# Patient Record
Sex: Female | Born: 1985 | Race: White | Hispanic: Yes | Marital: Married | Smoking: Never smoker
Health system: Southern US, Community
[De-identification: ages and names within clinical notes are randomized; demographics above are authoritative.]

## PROBLEM LIST (undated history)

## (undated) ENCOUNTER — Inpatient Hospital Stay (HOSPITAL_COMMUNITY): Payer: Self-pay

## (undated) DIAGNOSIS — D649 Anemia, unspecified: Secondary | ICD-10-CM

## (undated) DIAGNOSIS — E785 Hyperlipidemia, unspecified: Secondary | ICD-10-CM

## (undated) DIAGNOSIS — Z789 Other specified health status: Secondary | ICD-10-CM

## (undated) DIAGNOSIS — I82409 Acute embolism and thrombosis of unspecified deep veins of unspecified lower extremity: Secondary | ICD-10-CM

## (undated) HISTORY — PX: TUBAL LIGATION: SHX77

## (undated) HISTORY — DX: Hyperlipidemia, unspecified: E78.5

## (undated) HISTORY — DX: Anemia, unspecified: D64.9

## (undated) HISTORY — PX: BREAST SURGERY: SHX581

## (undated) HISTORY — DX: Acute embolism and thrombosis of unspecified deep veins of unspecified lower extremity: I82.409

---

## 2005-08-03 ENCOUNTER — Ambulatory Visit (HOSPITAL_COMMUNITY): Admission: RE | Admit: 2005-08-03 | Discharge: 2005-08-03 | Payer: Self-pay | Admitting: Obstetrics and Gynecology

## 2005-08-24 ENCOUNTER — Inpatient Hospital Stay (HOSPITAL_COMMUNITY): Admission: RE | Admit: 2005-08-24 | Discharge: 2005-08-26 | Payer: Self-pay | Admitting: Obstetrics and Gynecology

## 2009-04-23 ENCOUNTER — Emergency Department (HOSPITAL_COMMUNITY): Admission: EM | Admit: 2009-04-23 | Discharge: 2009-04-24 | Payer: Self-pay | Admitting: Emergency Medicine

## 2009-04-25 ENCOUNTER — Emergency Department (HOSPITAL_COMMUNITY): Admission: EM | Admit: 2009-04-25 | Discharge: 2009-04-25 | Payer: Self-pay | Admitting: Emergency Medicine

## 2009-04-25 ENCOUNTER — Encounter: Payer: Self-pay | Admitting: Obstetrics & Gynecology

## 2009-12-06 ENCOUNTER — Inpatient Hospital Stay (HOSPITAL_COMMUNITY): Admission: AD | Admit: 2009-12-06 | Discharge: 2009-12-07 | Payer: Self-pay | Admitting: Family Medicine

## 2009-12-06 ENCOUNTER — Ambulatory Visit: Payer: Self-pay | Admitting: Nurse Practitioner

## 2010-01-06 ENCOUNTER — Other Ambulatory Visit: Admission: RE | Admit: 2010-01-06 | Discharge: 2010-01-06 | Payer: Self-pay | Admitting: Obstetrics and Gynecology

## 2010-07-17 LAB — CBC
HCT: 37.1 % (ref 36.0–46.0)
MCHC: 34.2 g/dL (ref 30.0–36.0)
MCV: 80.7 fL (ref 78.0–100.0)
Platelets: 177 10*3/uL (ref 150–400)
RDW: 14.9 % (ref 11.5–15.5)

## 2010-07-17 LAB — TYPE AND SCREEN: ABO/RH(D): B POS

## 2010-07-17 LAB — SURGICAL PCR SCREEN: Staphylococcus aureus: NEGATIVE

## 2010-07-17 LAB — RPR: RPR Ser Ql: NONREACTIVE

## 2010-07-18 ENCOUNTER — Inpatient Hospital Stay (HOSPITAL_COMMUNITY)
Admission: RE | Admit: 2010-07-18 | Discharge: 2010-07-20 | Payer: Self-pay | Source: Home / Self Care | Attending: Obstetrics & Gynecology | Admitting: Obstetrics & Gynecology

## 2010-07-18 LAB — URINALYSIS, ROUTINE W REFLEX MICROSCOPIC
Nitrite: NEGATIVE
Urine Glucose, Fasting: NEGATIVE mg/dL
Urobilinogen, UA: 0.2 mg/dL (ref 0.0–1.0)

## 2010-07-19 LAB — CBC
HCT: 30.5 % — ABNORMAL LOW (ref 36.0–46.0)
Hemoglobin: 10.1 g/dL — ABNORMAL LOW (ref 12.0–15.0)
MCV: 83.6 fL (ref 78.0–100.0)
RBC: 3.65 MIL/uL — ABNORMAL LOW (ref 3.87–5.11)
WBC: 8.8 10*3/uL (ref 4.0–10.5)

## 2010-08-05 NOTE — Discharge Summary (Signed)
  NAME:  Kristine, Adams    ACCOUNT NO.:  [id number]  MEDICAL RECORD NO.:  [id number]          PATIENT TYPE:  INP  LOCATION:  9136                          [facility]ACILITY:  [facility]  PHYSICIAN:  Lazaro Arms, M.D.   DATE OF BIRTH:  1986-03-07  DATE OF ADMISSION:  07/18/2010 DATE OF DISCHARGE:  07/20/2010                              DISCHARGE SUMMARY   REASON FOR ADMISSION:  Pregnancy at 40-3/7 weeks with previous cesarean section x2 with declined VBAC, for repeat low transverse cesarean section.  PROCEDURE:  Low-transverse cesarean section by Dr. Duane Lope which delivered a female infant weight 7 pounds 7 ounces with a cord pH 7.07. Apgars of 8 and 9.  Hospital course has been uneventful.  The patient is up ambulating well taking p.o. fluids and solids well, passing gas rectally.  Discharge medications are as follows prenatal vitamin one p.o. daily, Motrin 600 p.o. q.6 h. p.r.n. cramping, Percocet 5/325 one p.o. q.4 h. p.r.n. pain.  FOLLOWUP:  She is to follow up at the [facility] in 6 weeks for her postpartum checkup or p.r.n. if any problems. Discharge hemoglobin is 12.7, discharge hematocrit is 37.1.  ACTIVITY LEVEL:  No heavy lifting or driving for at least 2 weeks postoperatively.  PHYSICAL EXAMINATION:  VITAL SIGNS:  Today, vital signs are stable. HEART:  Regular rhythm and rate. LUNGS:  Clear to auscultation bilaterally. ABDOMEN:  Soft.  Bowel sounds are present in all 4 quadrants. PELVIS:  Fundus is firm.  Lochia small amount.  There is no edema in the lower extremities.  Incision is intact.  There is no redness, swelling, or drainage.  ASSESSMENT:  Stable postop day #2.  PLAN:  We are going to discharge her home to follow up at [facility] in 6 weeks or p.r.n.     Zerita Boers, N.M.   ______________________________ Lazaro Arms, M.D.    DL/MEDQ  D:  16/03/9603  T:  07/20/2010  Job:  [id number]  cc:   [facility] Department  Electronically Signed by Wyvonnia Dusky N.M. on 08/03/2010 01:08:56 PM Electronically Signed by Duane Lope M.D. on 08/05/2010 04:59:02 PM

## 2010-08-05 NOTE — H&P (Signed)
  NAME:  Kristine Adams, Kristine Adams NO.:  [id number]  MEDICAL RECORD NO.:  [id number]          PATIENT TYPE:  INP  LOCATION:  9136                          [facility]  PHYSICIAN:  Lazaro Arms, M.D.   DATE OF BIRTH:  05-27-1986  DATE OF ADMISSION:  07/18/2010 DATE OF DISCHARGE:                             HISTORY & PHYSICAL   HISTORY OF PRESENT ILLNESS:  [name] is a 25 year old Hispanic female, gravida 4, para 2, abortus 1, estimated date of delivery of July 16, 2010, currently 40-3/7th weeks' gestation.  She has previous C-section x2 and despite my protest, wanted to undergo a vaginal trial. She had her first 2 C-sections in [place], however, the patient decided she wanted to go forward with vaginal delivery, however, when she came for a visit this week at 40 weeks, was still long, thick, and closed. She changed her mind and decided to proceed with a repeat cesarean section.  She was admitted today for that.  Pregnancy otherwise has been completely uncomplicated.  PAST MEDICAL HISTORY:  Negative.  PAST SURGERIES:  Two C-sections.  PAST OB:  Two C-sections, the first one for nonreassuring fetal heart rate strip and the second was a repeat, both in [place].  ALLERGIES:  None.  MEDICATIONS:  Prenatal vitamins and iron.  REVIEW OF SYSTEMS:  Negative.  PRENATAL LABS:  Her blood type is B positive, rubella is immune, hepatitis B was negative, HIV is negative x2,  HSV 2 is negative, serology is negative x2, GC and Chlamydia are negative x2, AFP was normal, group B strep was negative, Glucola was 140, but I never could get the patient to come back in for a 3-hour.  Her hemoglobin and hematocrit were 12 and 36.5 at 28 weeks  Her group B strep was negative.  PHYSICAL EXAMINATION:  HEENT:  Unremarkable. NECK:  Thyroid is normal. LUNGS:  Clear. HEART:  Regular rate and rhythm.  No murmur, regurg, or gallop. BREASTS:  No mass, discharge, skin changes. ABDOMEN:   Gravid, 38 cm.  Cervix long, thick, closed, firm, posterior. EXTREMITIES:  Warm.  No edema. NEUROLOGIC:  Grossly intact.  IMPRESSION: 1. Intrauterine pregnancy at 40-3/7th weeks' gestation. 2. Previous cesarean section x2, changed her mind, now declined trial     of labor for repeat.  PLAN:  The patient was admitted for repeat cesarean section and postop care.  She understands the risks, benefits, indications, and alternatives.  We will proceed.     Lazaro Arms, M.D.     Kristine Adams  D:  07/18/2010  T:  07/18/2010  Job:  [id number]  Electronically Signed by Duane Lope M.D. on 08/05/2010 04:58:55 PM

## 2010-08-05 NOTE — Op Note (Signed)
  NAME:  Kristine Adams, Kristine Adams    ACCOUNT NO.:  [id number]  MEDICAL RECORD NO.:  [id number]          PATIENT TYPE:  INP  LOCATION:  9136                          [facility]  [facility]  PHYSICIAN:  Lazaro Arms, M.D.   DATE OF BIRTH:  1985/06/25  DATE OF PROCEDURE:  07/18/2010 DATE OF DISCHARGE:                              OPERATIVE REPORT   PREOPERATIVE DIAGNOSES: 1. Intrauterine pregnancy at 40-3/7th weeks' gestation. 2. Previous cesarean section x2. 3. Declined vaginal birth after cesarean section trial.  POSTOPERATIVE DIAGNOSES: 1. Intrauterine pregnancy at 40-3/7th weeks' gestation. 2. Previous cesarean section x2. 3. Declined vaginal birth after cesarean section trial.  PROCEDURE:  Repeat low transverse cesarean resection.  SURGEON:  Lazaro Arms, MD  ANESTHESIA:  Spinal.  FINDINGS:  Over low transverse hysterotomy incision was delivered a viable female infant with Apgars of 8 and 9, weighing 7 pounds 7 ounces, 3-vessel cord.  Cord blood and cord gas were sent.  PH 7.07.  There was light meconium.  Uterus, tubes, and ovaries were normal.  DESCRIPTION OF OPERATION:  The patient was taken to the operating room, placed in the sitting position, where she underwent a spinal anesthetic. She was then placed in the supine position with left uterine tilt, prepped and draped in usual sterile fashion.  A Foley catheter was placed.  Pfannenstiel skin incision was made and carried down sharply to the rectus fascia which was scored in the midline, extended laterally. The fascia was taken off the muscle superiorly and inferiorly without difficulty.  The muscles were divided.  Peritoneal cavity was entered. A bladder blade was placed.  A low transverse hysterotomy incision was made.  Over this incision was delivered a viable female infant with Apgars of 8 and 9, weighing 7 pounds and 7 ounces.  The neonatology and respiratory therapist group were in attendance for routine  neonatal resuscitation.  The placenta was delivered spontaneously.  The uterus was wiped cleaned with clean lap pad, exteriorized, and closed in 2 layers, first being a running interlocking layer, the second being imbricating layer.  There was good hemostasis.  Pelvis was irrigated vigorously and the pedicles found to be she was hemostatic.  The muscles and peritoneum were reapproximated loosely.  The fascia was closed using 0 Vicryl running.  Subcutaneous tissues made hemostatic, irrigated, and reapproximated loosely.  The skin was closed using 4-0 Vicryl on a Keith needle in subcuticular fashion and Dermabond.  The patient tolerated the procedure well.  She experienced 500 mL of blood loss, taken to recovery room in stable condition.  All counts were correct x3.     Lazaro Arms, M.D.     Loraine Maple  D:  07/18/2010  T:  07/18/2010  Job:  [id number]  Electronically Signed by Duane Lope M.D. on 08/05/2010 04:58:54 PM

## 2010-09-07 LAB — URINE MICROSCOPIC-ADD ON

## 2010-09-07 LAB — GC/CHLAMYDIA PROBE AMP, GENITAL
Chlamydia, DNA Probe: NEGATIVE
GC Probe Amp, Genital: NEGATIVE

## 2010-09-07 LAB — URINALYSIS, ROUTINE W REFLEX MICROSCOPIC
Bilirubin Urine: NEGATIVE
Glucose, UA: NEGATIVE mg/dL
Leukocytes, UA: NEGATIVE
Nitrite: NEGATIVE
Protein, ur: 30 mg/dL — AB

## 2010-09-07 LAB — WET PREP, GENITAL

## 2010-09-24 LAB — URINE MICROSCOPIC-ADD ON

## 2010-09-24 LAB — CBC
HCT: 38.4 % (ref 36.0–46.0)
HCT: 39.9 % (ref 36.0–46.0)
Hemoglobin: 13 g/dL (ref 12.0–15.0)
MCHC: 33.9 g/dL (ref 30.0–36.0)
MCHC: 34.1 g/dL (ref 30.0–36.0)
MCV: 85 fL (ref 78.0–100.0)
MCV: 85.1 fL (ref 78.0–100.0)
Platelets: 152 10*3/uL (ref 150–400)
Platelets: 181 10*3/uL (ref 150–400)
RBC: 4.7 MIL/uL (ref 3.87–5.11)
RDW: 13 % (ref 11.5–15.5)
RDW: 13.1 % (ref 11.5–15.5)

## 2010-09-24 LAB — BASIC METABOLIC PANEL
BUN: 9 mg/dL (ref 6–23)
Calcium: 9.3 mg/dL (ref 8.4–10.5)
Calcium: 9.5 mg/dL (ref 8.4–10.5)
GFR calc Af Amer: >60 mL/min (ref >60)
GFR calc Af Amer: >60 mL/min (ref >60)
GFR calc non Af Amer: >60 mL/min (ref >60)
Glucose, Bld: 104 mg/dL — ABNORMAL HIGH (ref 70–99)
Glucose, Bld: 143 mg/dL — ABNORMAL HIGH (ref 70–99)
Potassium: 3.5 mEq/L (ref 3.5–5.1)
Potassium: 3.6 mEq/L (ref 3.5–5.1)
Sodium: 139 mEq/L (ref 135–145)

## 2010-09-24 LAB — URINALYSIS, ROUTINE W REFLEX MICROSCOPIC
Nitrite: NEGATIVE
Protein, ur: NEGATIVE mg/dL
Specific Gravity, Urine: >1.03 — ABNORMAL HIGH (ref 1.005–1.030)
Urobilinogen, UA: 0.2 mg/dL (ref 0.0–1.0)

## 2010-09-24 LAB — ABO/RH: ABO/RH(D): B POS

## 2010-09-24 LAB — PROTIME-INR: INR: 1.19 (ref 0.00–1.49)

## 2010-09-24 LAB — TYPE AND SCREEN
ABO/RH(D): B POS
Antibody Screen: NEGATIVE

## 2010-09-24 LAB — GC/CHLAMYDIA PROBE AMP, GENITAL: Chlamydia, DNA Probe: NEGATIVE

## 2010-09-24 LAB — WET PREP, GENITAL: Yeast Wet Prep HPF POC: NONE SEEN

## 2010-09-24 LAB — HCG, QUANTITATIVE, PREGNANCY: hCG, Beta Chain, Quant, S: 1355 m[IU]/mL — ABNORMAL HIGH (ref <5)

## 2010-11-07 NOTE — H&P (Signed)
NAME:  Kristine Adams, Kristine Adams    ACCOUNT NO.:  [id number]   MEDICAL RECORD NO.:  [id number]          PATIENT TYPE:  AMB   LOCATION:  DAY                           [facility]  [facility]   PHYSICIAN:  Tilda Burrow, M.D. DATE OF BIRTH:  1985-12-31   DATE OF ADMISSION:  08/24/2005  DATE OF DISCHARGE:  LH                                HISTORY & PHYSICAL   ADMITTING DIAGNOSES:  1.  Pregnancy 39+ weeks gestation.  2.  Repeat cesarean section, not for trial of labor.   HISTORY OF PRESENT ILLNESS:  This 25 year old gravida 2, para 1, prior  cesarean section in [place] is admitted at this time for repeat cesarean  section at 39 weeks 4 days.  She has been followed through our office with  initial LMP November 23, 2004 suggesting [facility] of September 01, 2005 and 20-week  ultrasound suggesting August 27, 2005.  She has been followed through our  office with an uncomplicated prenatal course through six prenatal visits  with labs including Pap smear Class 1, GC and Chlamydia cultures negative,  MSAFP negative for Down syndrome, open spinal cord and trisomy 18.  Hemoglobin 12, hematocrit 36, hepatitis, HIV, RPR, GC and Chlamydia all  negative.  Rubella immunity is present.  Blood type is B positive.  Urine  drug screen completely negative.  Alpha-fetoprotein negative as described  earlier.  Down syndrome risk 1:28,800.   PHYSICAL EXAMINATION:  GENERAL:  Healthy-appearing Hispanic female, alert,  oriented x3.  HEENT:  Pupils equal, round, reactive.  NECK:  Supple.  CARDIOVASCULAR:  Exam unremarkable.  ABDOMEN:  Abdomen 38 cm fundal height.  Well-healed surgical scar from prior  cesarean midline incision.   ALLERGIES:  None.   IMPRESSION:  Pregnancy 39+ weeks gestation.   PLAN:  Repeat cesarean section August 24, 2005.      Tilda Burrow, M.D.  Electronically Signed     JVF/MEDQ  D:  08/23/2005  T:  08/24/2005  Job:  [id number]   cc:   [facility]   [facility] Dept.   [facility]  Fax: [phone]   Francoise Schaumann. Milford Cage DO, FAAP  Fax: [phone]

## 2010-11-07 NOTE — Discharge Summary (Signed)
NAMECAMIL, HAUSMANN              ACCOUNT NO.:  [id number]   MEDICAL RECORD NO.:  [id number]          PATIENT TYPE:  INP   LOCATION:  A413                          FACILITY:  [facility]   PHYSICIAN:  Tilda Burrow, M.D. DATE OF BIRTH:  07/14/1985   DATE OF ADMISSION:  08/24/2005  DATE OF DISCHARGE:  03/07/2007LH                                 DISCHARGE SUMMARY   ADMITTING DIAGNOSES:  1.  Pregnancy 39 plus weeks gestation.  2.  Repeat cesarean section not for trial of labor.   PROCEDURE:  August 24, 2005, repeat cesarean section Jannifer Franklin, M.D.).   DISCHARGE MEDICATIONS:  1.  Vicodin 5/500 (#30) one to two q.4 h p.r.n. pain.  2.  Motrin 800 mg one p.o. q.8 h p.r.n. mild pain (dispense 20).   CONTRACEPTION PLANS:  Undetermined.   HOSPITAL SUMMARY:  This 25 year old female gravida 2, para 1 Hispanic-  speaking/no English with minimal prenatal care consisting of five prenatal  visits, prior cesarean section November 2003 in [place] has admitted for  repeat cesarean section at term.   HOSPITAL COURSE:  The patient was admitted with hemoglobin 10, hematocrit  30, blood type B positive.  She underwent primary cesarean section  delivering a healthy female infant Apgars 9/9.  Postpartum hemoglobin was  9.8, hematocrit 30.3, blood gas on the infant was pH 7.24, pCO2 60, pO2 17,  bicarb 24.9.  Base deficit 3.3.   The patient had an uncomplicated postoperative course.  The vertical lower  abdominal incision appeared well healed for 2 days postoperative at the time  of discharge.  Half staples will be removed prior to discharge with followup  September 01, 2005 for staple removal and incision check by Dr. Kate Sable.      Tilda Burrow, M.D.  Electronically Signed     JVF/MEDQ  D:  08/26/2005  T:  08/26/2005  Job:  [id number]   cc:   [facility]

## 2010-11-07 NOTE — Op Note (Signed)
Kristine Adams, Kristine Adams              ACCOUNT NO.:  [id number]   MEDICAL RECORD NO.:  [id number]          PATIENT TYPE:  INP   LOCATION:  A413                          FACILITY:  [facility]   PHYSICIAN:  Tilda Burrow, M.D. DATE OF BIRTH:  1985/08/04   DATE OF PROCEDURE:  08/24/2005  DATE OF DISCHARGE:                                 OPERATIVE REPORT   PREOPERATIVE DIAGNOSIS:  Pregnancy, term. Repeat Cesarean section. Not for  trial of labor.   POSTOPERATIVE DIAGNOSIS:  Pregnancy, term. Repeat Cesarean section. Not for  trial of labor.   PROCEDURE:  Repeat low transverse cervical Cesarean section.   SURGEON:  Dr. Emelda Fear.   ASSISTANT:  Melvyn, R.N.   ANESTHESIA:  General.   COMPLICATIONS:  None.   FINDINGS:  A 6-pound, 6.8-ounce female infant, Apgars 9 and 10, born at 8:13  a.m.   DETAILS OF PROCEDURE:  The patient was taken to the operating room, prepped  and draped in usual fashion for lower abdominal surgery. Foley catheter was  then placed and leg plate in position. Prior vertical lower abdominal  incision was repeated, excising the old cicatrix. We entered the peritoneal  cavity without difficulty, splitting the rectus muscles. Blunt entry of the  peritoneal cavity was easily achieved and the peritoneum sharply incised.  There were no intraabdominal adhesions encountered. Bladder flap was  developed on the lower uterine segment and a transverse uterine incision  repeated beneath the bladder flap, with easy entry of the uterine cavity.  Singleton vertex incision was easily identified, expelled through the  incision using fundal pressure with easy expulsion. The baby was handed to  Dr. Milinda Cave for subsequent care. See his notes for details.   Cord blood samples were obtained and placenta delivered by crede uterine  massage with IV oxytocin infused. There was a tendency toward for the  placenta bed to ooze, particularly on the posterior lower uterine segment.  This responded  gradually to fundal pressure to uterine massage and sponge  placed over the area of oozing.   The uterine cavity was irrigated with antibiotic solution. A single layer of  running locking closure of the uterine incision followed by 2-0 chromic  bladder flap reapproximation.   Abdominal cavity was irrigated and inspected. No adhesions or abnormalities  encountered, and the anterior peritoneum closed with running 2-0 chromic.  Fascia was closed with running 0 Vicryl, subcutaneous tissues mobilized on  each side of the old scar so that it can be pulled together with continuous  running 2-0 plain followed by staple closure of the skin. EBL 500 cc.      Tilda Burrow, M.D.  Electronically Signed     JVF/MEDQ  D:  08/24/2005  T:  08/25/2005  Job:  [id number]   cc:   Jeoffrey Massed, MD  Fax: [phone]   [facility]   [facility] Department

## 2012-02-14 ENCOUNTER — Emergency Department (HOSPITAL_COMMUNITY)
Admission: EM | Admit: 2012-02-14 | Discharge: 2012-02-14 | Disposition: A | Discharge status: Home / Self Care | Payer: Medicaid Other | Attending: Emergency Medicine | Admitting: Emergency Medicine

## 2012-02-14 ENCOUNTER — Encounter (HOSPITAL_COMMUNITY): Payer: Self-pay | Admitting: Emergency Medicine

## 2012-02-14 DIAGNOSIS — N390 Urinary tract infection, site not specified: Secondary | ICD-10-CM | POA: Insufficient documentation

## 2012-02-14 DIAGNOSIS — O2390 Unspecified genitourinary tract infection in pregnancy, unspecified trimester: Secondary | ICD-10-CM | POA: Insufficient documentation

## 2012-02-14 DIAGNOSIS — O2340 Unspecified infection of urinary tract in pregnancy, unspecified trimester: Secondary | ICD-10-CM

## 2012-02-14 LAB — URINALYSIS, ROUTINE W REFLEX MICROSCOPIC
Hgb urine dipstick: NEGATIVE
Ketones, ur: NEGATIVE mg/dL
Protein, ur: NEGATIVE mg/dL
Urobilinogen, UA: 1 mg/dL (ref 0.0–1.0)

## 2012-02-14 LAB — POCT PREGNANCY, URINE: Preg Test, Ur: POSITIVE — AB

## 2012-02-14 MED ORDER — NITROFURANTOIN MONOHYD MACRO 100 MG PO CAPS: 100.0000 mg | ORAL_CAPSULE | Freq: Two times a day (BID) | ORAL | Status: DC

## 2012-02-14 NOTE — ED Provider Notes (Signed)
History     CSN: [id number]  Arrival date & time 02/14/12  1351   First MD Initiated Contact with Patient 02/14/12 1559      Chief Complaint  Patient presents with  . Urinary Tract Infection    (Consider location/radiation/quality/duration/timing/severity/associated sxs/prior treatment) Patient is a 26 y.o. female presenting with dysuria. The history is provided by the patient. The history is limited by a language barrier. A language interpreter was used.  Dysuria  This is a new problem. The current episode started yesterday. The problem occurs every urination. The problem has not changed since onset.The quality of the pain is described as burning. The pain is mild. There has been no fever. She is sexually active. There is no history of pyelonephritis. Associated symptoms include possible pregnancy ([redacted] weeks pregnant). Pertinent negatives include no chills, no sweats, no nausea, no vomiting, no discharge, no frequency, no hematuria, no hesitancy, no urgency and no flank pain. She has tried nothing for the symptoms. Her past medical history does not include recurrent UTIs.    History reviewed. No pertinent past medical history.  History reviewed. No pertinent past surgical history.  History reviewed. No pertinent family history.  History  Substance Use Topics  . Smoking status: Never Smoker   . Smokeless tobacco: Not on file  . Alcohol Use: No    OB History    Grav Para Term Preterm Abortions TAB SAB Ect Mult Living                  Review of Systems  Constitutional: Negative for fever and chills.  HENT: Negative.   Eyes: Negative.   Respiratory: Negative.   Cardiovascular: Negative.   Gastrointestinal: Negative.  Negative for nausea and vomiting.  Genitourinary: Positive for dysuria. Negative for hesitancy, urgency, frequency, hematuria, flank pain, vaginal bleeding, vaginal discharge, difficulty urinating, vaginal pain and pelvic pain.  Musculoskeletal: Negative.     Skin: Negative.   Neurological: Negative.   Hematological: Negative.   All other systems reviewed and are negative.    Allergies  Review of patient's allergies indicates no known allergies.  Home Medications   Current Outpatient Rx  Name Route Sig Dispense Refill  . PRENATAL MULTIVITAMIN CH Oral Take 1 tablet by mouth daily.    Marland Kitchen NITROFURANTOIN MONOHYD MACRO 100 MG PO CAPS Oral Take 1 capsule (100 mg total) by mouth 2 (two) times daily. 14 capsule 0    BP 95/60  Pulse 70  Temp 99.9 F (37.7 C) (Oral)  Resp 18  SpO2 100%  Physical Exam  Nursing note and vitals reviewed. Constitutional: She is oriented to person, place, and time. She appears well-developed and well-nourished. No distress.  HENT:  Head: Normocephalic and atraumatic.  Eyes: Conjunctivae are normal.  Neck: Neck supple.  Cardiovascular: Normal rate, regular rhythm, normal heart sounds and intact distal pulses.   Pulmonary/Chest: Effort normal and breath sounds normal. She has no wheezes. She has no rales.  Abdominal: Soft. She exhibits no distension. There is no tenderness. There is no CVA tenderness.  Musculoskeletal: Normal range of motion.  Neurological: She is alert and oriented to person, place, and time.  Skin: Skin is warm and dry.    ED Course  Procedures (including critical care time)  Labs Reviewed  URINALYSIS, ROUTINE W REFLEX MICROSCOPIC - Abnormal; Notable for the following:    Color, Urine AMBER (*)  BIOCHEMICALS MAY BE AFFECTED BY COLOR   APPearance CLOUDY (*)     Leukocytes, UA MODERATE (*)  All other components within normal limits  POCT PREGNANCY, URINE - Abnormal; Notable for the following:    Preg Test, Ur POSITIVE (*)     All other components within normal limits  URINE MICROSCOPIC-ADD ON - Abnormal; Notable for the following:    Bacteria, UA MANY (*)     All other components within normal limits   No results found.   1. Urinary tract infection during pregnancy        MDM  25 yo female who is [redacted] weeks pregnant presents for one day history of burning with urination and foul smelling odor.  No fever, flank pain, abdominal pain, vaginal bleeding, vaginal discharge, pelvic pain.  Pt has been evaluated by her OB for this pregnancy and reports a UTI that was treated one month ago.  AF, VSS, NAD.  Physical exam as above.  FHT 152 on bedside ultrasound.  No CVA tenderness or signs of pyelonephritis.  UA consistent with UTI.  Will treat with course of Macrobid and recommend close PCP follow-up.  Tx plan discussed with patient.  Strong return precautions provided.        Cherre Robins, MD 02/15/12 [phone]

## 2012-02-14 NOTE — ED Notes (Signed)
Pt c/o increased burning with urination x 2 days

## 2012-02-15 NOTE — ED Provider Notes (Signed)
I saw and evaluated the patient, reviewed the resident's note and I agree with the findings and plan.  Confirmed [redacted] week gestation with urine odor and burning with urination.  No abdominal pain or bleeding.  No fever or flank pain.  Nontoxic appearing.  Glynn Octave, MD 02/15/12 1229

## 2012-02-24 ENCOUNTER — Other Ambulatory Visit (HOSPITAL_COMMUNITY)
Admission: RE | Admit: 2012-02-24 | Discharge: 2012-02-24 | Disposition: A | Discharge status: Home / Self Care | Payer: Self-pay | Source: Ambulatory Visit | Attending: Obstetrics and Gynecology | Admitting: Obstetrics and Gynecology

## 2012-02-24 DIAGNOSIS — Z113 Encounter for screening for infections with a predominantly sexual mode of transmission: Secondary | ICD-10-CM | POA: Insufficient documentation

## 2012-02-24 DIAGNOSIS — Z01419 Encounter for gynecological examination (general) (routine) without abnormal findings: Secondary | ICD-10-CM | POA: Insufficient documentation

## 2012-05-26 ENCOUNTER — Encounter (HOSPITAL_COMMUNITY): Payer: Self-pay | Admitting: *Deleted

## 2012-05-26 ENCOUNTER — Inpatient Hospital Stay (HOSPITAL_COMMUNITY): Payer: Self-pay

## 2012-05-26 ENCOUNTER — Inpatient Hospital Stay (HOSPITAL_COMMUNITY)
Admission: AD | Admit: 2012-05-26 | Discharge: 2012-05-26 | Disposition: A | Discharge status: Hospice | Payer: Self-pay | Source: Ambulatory Visit | Attending: Obstetrics & Gynecology | Admitting: Obstetrics & Gynecology

## 2012-05-26 DIAGNOSIS — B9689 Other specified bacterial agents as the cause of diseases classified elsewhere: Secondary | ICD-10-CM | POA: Insufficient documentation

## 2012-05-26 DIAGNOSIS — O2390 Unspecified genitourinary tract infection in pregnancy, unspecified trimester: Secondary | ICD-10-CM | POA: Insufficient documentation

## 2012-05-26 DIAGNOSIS — O4690 Antepartum hemorrhage, unspecified, unspecified trimester: Secondary | ICD-10-CM | POA: Insufficient documentation

## 2012-05-26 DIAGNOSIS — N760 Acute vaginitis: Secondary | ICD-10-CM | POA: Insufficient documentation

## 2012-05-26 DIAGNOSIS — A499 Bacterial infection, unspecified: Secondary | ICD-10-CM | POA: Insufficient documentation

## 2012-05-26 LAB — URINE MICROSCOPIC-ADD ON

## 2012-05-26 LAB — URINALYSIS, ROUTINE W REFLEX MICROSCOPIC
Glucose, UA: NEGATIVE mg/dL
Ketones, ur: NEGATIVE mg/dL
Leukocytes, UA: NEGATIVE
Nitrite: NEGATIVE
Specific Gravity, Urine: 1.025 (ref 1.005–1.030)
pH: 7 (ref 5.0–8.0)

## 2012-05-26 LAB — AMNISURE RUPTURE OF MEMBRANE (ROM) NOT AT ARMC: Amnisure ROM: NEGATIVE

## 2012-05-26 MED ORDER — METRONIDAZOLE 500 MG PO TABS: 500.0000 mg | ORAL_TABLET | Freq: Two times a day (BID) | ORAL | Status: DC

## 2012-05-26 NOTE — MAU Provider Note (Signed)
History     CSN: [id number]  Arrival date and time: 05/26/12 1342   None     Chief Complaint  Patient presents with  . Vaginal Bleeding   HPI: Pt is a 26 y.o. G4P3003 at 32.3 by LMP consistent with 16w [place], presenting from [facility] clinic with concern for rupture of membranes. Pt seen by Dr. Emelda Fear and referred here to MAU. Pt saw bright blood when cleaning up after using the restroom once yesterday and twice today, 0640 and 0800. Last intercourse two days ago. Never had bleeding before during pregnancy. Describes some daily leakage of "smelly" fluid for about two weeks, very small amount but throughout most of the day. Good fetal movements.   Pt endorses "headaches that come and go that respond to Tylenol" and some shortness of breath, occasionally. Denies changes in vision or new/worse swelling. No RUQ pain. No N/V, fevers, or chills.  Pt's prenatal care at [facility] has been uncomplicated thusfar. Pt has a history of three prior sections at term.  OB History    Grav Para Term Preterm Abortions TAB SAB Ect Mult Living   4 3 3  0 0 0 0 0 0 3      History reviewed. No pertinent past medical history.  Past Surgical History  Procedure Date  . Cesarean section    History reviewed. No pertinent family history.  History  Substance Use Topics  . Smoking status: Never Smoker   . Smokeless tobacco: Not on file  . Alcohol Use: No    Allergies: No Known Allergies  Prescriptions prior to admission  Medication Sig Dispense Refill  . Prenatal Vit-Fe Fumarate-FA (PRENATAL MULTIVITAMIN) TABS Take 1 tablet by mouth daily.        ROS: See HPI  Physical Exam   Blood pressure 95/56, pulse 47, temperature 97.6 F (36.4 C), temperature source Oral, resp. rate 16, last menstrual period 09/21/2011.  Physical Exam  Nursing note and vitals reviewed. Constitutional: She is oriented to person, place, and time. She appears well-developed and well-nourished. No distress.  HENT:   Head: Normocephalic and atraumatic.  Eyes: Conjunctivae normal are normal. Pupils are equal, round, and reactive to light.  Neck: Normal range of motion. Neck supple.  Cardiovascular: Normal rate, regular rhythm and normal heart sounds.   No murmur heard. Respiratory: Effort normal and breath sounds normal. She has no wheezes.  GI: Soft. Bowel sounds are normal. There is no tenderness.       Gravid  Genitourinary: Vagina normal. Pelvic exam was performed with patient prone.       Sterile speculum exam: Scant whitish/pink-tinged discharge with strong odor, cervix visually closed  Digital exam: Dilation: Closed Effacement (%): 50 Exam by:: Street/Morris RN  Musculoskeletal: Normal range of motion. She exhibits no edema.  Neurological: She is alert and oriented to person, place, and time.  Skin: Skin is warm and dry. No erythema.  Psychiatric: She has a normal mood and affect. Her behavior is normal.    MAU Course  Procedures -sterile spec exam: closed, discharge as above -amnisure: NEGATIVE -wet prep: few clue cells -OB limited  MDM: FHR tracing reassuring, cervix visually and digitally examined closed (though very soft). Wet prep suggestive of BV.     Assessment and Plan  26 y.o. G4P3003 at 32.3 -not likely PPROM  -amnisure negative  -US shows normal AFI and cervical length -BV, will treat with Flagyl -preterm labor precautions and red flags discussed -pt to follow up tomorrow with [facility] for second dose of BMX, then as needed  Above discussed with Roney Marion, CNM and Dr. Marice Potter (Faculty attending)  Street, Cristal Deer 05/26/2012, 4:52 PM   I examined pt and agree with documentation above and resident plan of care. [facility]

## 2012-05-26 NOTE — MAU Note (Signed)
Pt states some bleeding last night when she wipes and some lower back pain and leg pain.

## 2012-06-26 ENCOUNTER — Other Ambulatory Visit: Payer: Self-pay | Admitting: Obstetrics and Gynecology

## 2012-06-26 NOTE — H&P (Signed)
Kristine Adams is a 27 y.o. female presenting for repeat cesarean section and tubal ligation on 07/11/2012 . Prenatal course has [facility] and is notable for blood type B-positive antibody screen negative rubella immunity present hemoglobin 11, and declined to 10.7 at 28 weeks hepatitis HIV RPR GC and Chlamydia cultures all negative Pap smear is negative glucose tolerance test normal at 77, 147, 86. She desires spinal anesthesia takes her baby to [facility] pediatrics plans to bottle supplementation and desires permanent sterilization.. She speaks Spanish with some English (comprehended well).. Maternal Medical History:  Prenatal Complications - Diabetes: none.    OB History    Grav Para Term Preterm Abortions TAB SAB Ect Mult Living   4 3 3  0 0 0 0 0 0 3     No past medical history on file. Past Surgical History  Procedure Date  . Cesarean section    Family History: family history is not on file. Social History:  reports that she has never smoked. She does not have any smokeless tobacco history on file. She reports that she does not drink alcohol or use illicit drugs.   Prenatal Transfer Tool  Maternal Diabetes: No Genetic Screening: Normal Maternal Ultrasounds/Referrals: Normal Fetal Ultrasounds or other Referrals:  None Maternal Substance Abuse:  No Significant Maternal Medications:  Meds include: Other:  betamethasone received x1 dose at 32 weeks when membrane rupture was suspected and proven to be false-positive. Significant Maternal Lab Results:  Lab values include: Other: GBS pending  Other Comments:  evaluated at 32 weeks, suspected as having srom, given initial BMZ dose, 12 mg im, and then further review and And Amnisure were negative  Review of Systems  Constitutional: Negative.       Last menstrual period 09/21/2011. Exam weight 150 and three-quarter pounds on 12:30, BP 100/68 Physical Exam  Constitutional: She is  oriented to person, place, and time. She appears well-developed and well-nourished.  HENT:  Head: Normocephalic and atraumatic.  Eyes: Pupils are equal, round, and reactive to light.  Neck: Normal range of motion. Neck supple. No thyromegaly present.  Cardiovascular: Normal rate and regular rhythm.   Respiratory: Effort normal and breath sounds normal.  GI: Soft.       Gravid uterus consistent with dates  Neurological: She is alert and oriented to person, place, and time. She has normal reflexes.  Psychiatric: She has a normal mood and affect. Her behavior is normal. Thought content normal.    Prenatal labs: ABO, Rh:   see history of present illness Antibody:   Rubella:   RPR:    HBsAg:    HIV:    GBS:     Assessment/Plan: Pregnancy 39 weeks and repeat cesarean section not for trial of labor, desiring repeat cesarean section  desire for permanent sterilization   Repeat cesarean section and tubal ligation scheduled for Monday, 07/11/2012  Darryle Dennie V 06/26/2012, 3:15 PM

## 2012-07-08 ENCOUNTER — Encounter (HOSPITAL_COMMUNITY): Payer: Self-pay

## 2012-07-08 ENCOUNTER — Encounter (HOSPITAL_COMMUNITY)
Admission: RE | Admit: 2012-07-08 | Discharge: 2012-07-08 | Disposition: A | Discharge status: Home / Self Care | Payer: Medicaid Other | Source: Ambulatory Visit | Attending: Obstetrics and Gynecology | Admitting: Obstetrics and Gynecology

## 2012-07-08 HISTORY — DX: Other specified health status: Z78.9

## 2012-07-08 LAB — CBC
HCT: 32.5 % — ABNORMAL LOW (ref 36.0–46.0)
MCH: 24.5 pg — ABNORMAL LOW (ref 26.0–34.0)
MCHC: 31.7 g/dL (ref 30.0–36.0)
RDW: 15.1 % (ref 11.5–15.5)

## 2012-07-08 LAB — URINALYSIS, ROUTINE W REFLEX MICROSCOPIC
Glucose, UA: NEGATIVE mg/dL
Ketones, ur: 15 mg/dL — AB
Leukocytes, UA: NEGATIVE
Nitrite: NEGATIVE
Specific Gravity, Urine: >1.03 — ABNORMAL HIGH (ref 1.005–1.030)
pH: 5.5 (ref 5.0–8.0)

## 2012-07-08 LAB — SURGICAL PCR SCREEN
MRSA, PCR: NEGATIVE
Staphylococcus aureus: NEGATIVE

## 2012-07-08 LAB — RPR: RPR Ser Ql: NONREACTIVE

## 2012-07-08 NOTE — Pre-Procedure Instructions (Signed)
[facility] Spanish Interpreter present for PAT appointment

## 2012-07-08 NOTE — Patient Instructions (Addendum)
   Your procedure is scheduled NF:[id number] January 20th  Enter through the Main Entrance of [facility] at:8am Pick up the phone at the desk and dial [phone] and inform us of your arrival.  Please call this number if you have any problems the morning of surgery: [phone]  Remember: Do not eat or drink anything after midnight on Sunday   Do not wear jewelry, make-up, or FINGER nail polish No metal in your hair or on your body. Do not wear lotions, powders, perfumes. You may wear deodorant.  Please use your CHG wash as directed prior to surgery.  Do not shave anywhere for at least 12 hours prior to first CHG shower.  Do not bring valuables to the hospital.  Leave suitcase in the car. After Surgery it may be brought to your room. For patients being admitted to the hospital, checkout time is 11:00am the day of discharge.

## 2012-07-11 ENCOUNTER — Inpatient Hospital Stay (HOSPITAL_COMMUNITY)
Admission: RE | Admit: 2012-07-11 | Discharge: 2012-07-13 | DRG: 766 | Disposition: A | Discharge status: Home / Self Care | Payer: Medicaid Other | Source: Ambulatory Visit | Attending: Obstetrics and Gynecology | Admitting: Obstetrics and Gynecology

## 2012-07-11 ENCOUNTER — Encounter (HOSPITAL_COMMUNITY): Payer: Self-pay | Admitting: General Surgery

## 2012-07-11 ENCOUNTER — Inpatient Hospital Stay (HOSPITAL_COMMUNITY): Payer: Medicaid Other | Admitting: Anesthesiology

## 2012-07-11 ENCOUNTER — Encounter (HOSPITAL_COMMUNITY): Payer: Self-pay | Admitting: *Deleted

## 2012-07-11 ENCOUNTER — Encounter (HOSPITAL_COMMUNITY): Payer: Self-pay | Admitting: Anesthesiology

## 2012-07-11 ENCOUNTER — Encounter (HOSPITAL_COMMUNITY)
Admission: RE | Disposition: A | Discharge status: Home / Self Care | Payer: Self-pay | Source: Ambulatory Visit | Attending: Obstetrics and Gynecology

## 2012-07-11 DIAGNOSIS — Z01812 Encounter for preprocedural laboratory examination: Secondary | ICD-10-CM

## 2012-07-11 DIAGNOSIS — O34219 Maternal care for unspecified type scar from previous cesarean delivery: Secondary | ICD-10-CM

## 2012-07-11 DIAGNOSIS — Z302 Encounter for sterilization: Secondary | ICD-10-CM

## 2012-07-11 DIAGNOSIS — Z01818 Encounter for other preprocedural examination: Secondary | ICD-10-CM

## 2012-07-11 DIAGNOSIS — Z98891 History of uterine scar from previous surgery: Secondary | ICD-10-CM

## 2012-07-11 LAB — PREPARE RBC (CROSSMATCH)

## 2012-07-11 SURGERY — Surgical Case
Anesthesia: Spinal | Site: Abdomen | Laterality: Bilateral | Wound class: Clean Contaminated

## 2012-07-11 MED ORDER — ONDANSETRON HCL 4 MG/2ML IJ SOLN
INTRAMUSCULAR | Status: DC | PRN
  Administered 2012-07-11: 4 mg via INTRAVENOUS

## 2012-07-11 MED ORDER — CEFAZOLIN SODIUM-DEXTROSE 2-3 GM-% IV SOLR
INTRAVENOUS | Status: AC
  Filled 2012-07-11: qty 50

## 2012-07-11 MED ORDER — PRENATAL MULTIVITAMIN CH
1.0000 | ORAL_TABLET | Freq: Every day | ORAL | Status: DC
  Administered 2012-07-12 – 2012-07-13 (×2): 1 via ORAL
  Filled 2012-07-11 (×2): qty 1

## 2012-07-11 MED ORDER — NALBUPHINE HCL 10 MG/ML IJ SOLN
5.0000 mg | INTRAMUSCULAR | Status: DC | PRN
  Filled 2012-07-11: qty 1

## 2012-07-11 MED ORDER — DIBUCAINE 1 % RE OINT: 1.0000 "application " | TOPICAL_OINTMENT | RECTAL | Status: DC | PRN

## 2012-07-11 MED ORDER — DIPHENHYDRAMINE HCL 25 MG PO CAPS: 25.0000 mg | ORAL_CAPSULE | Freq: Four times a day (QID) | ORAL | Status: DC | PRN

## 2012-07-11 MED ORDER — PHENYLEPHRINE 40 MCG/ML (10ML) SYRINGE FOR IV PUSH (FOR BLOOD PRESSURE SUPPORT)
PREFILLED_SYRINGE | INTRAVENOUS | Status: AC
  Filled 2012-07-11: qty 5

## 2012-07-11 MED ORDER — DIPHENHYDRAMINE HCL 25 MG PO CAPS: 25.0000 mg | ORAL_CAPSULE | ORAL | Status: DC | PRN

## 2012-07-11 MED ORDER — OXYTOCIN 40 UNITS IN LACTATED RINGERS INFUSION - SIMPLE MED: 62.5000 mL/h | INTRAVENOUS | Status: AC

## 2012-07-11 MED ORDER — OXYCODONE-ACETAMINOPHEN 5-325 MG PO TABS
1.0000 | ORAL_TABLET | ORAL | Status: DC | PRN
  Administered 2012-07-11 – 2012-07-13 (×5): 1 via ORAL
  Filled 2012-07-11 (×5): qty 1

## 2012-07-11 MED ORDER — OXYTOCIN 10 UNIT/ML IJ SOLN
INTRAMUSCULAR | Status: AC
  Filled 2012-07-11: qty 4

## 2012-07-11 MED ORDER — PROMETHAZINE HCL 25 MG/ML IJ SOLN: 6.2500 mg | INTRAMUSCULAR | Status: DC | PRN

## 2012-07-11 MED ORDER — MEPERIDINE HCL 25 MG/ML IJ SOLN: 6.2500 mg | INTRAMUSCULAR | Status: DC | PRN

## 2012-07-11 MED ORDER — LACTATED RINGERS IV SOLN
INTRAVENOUS | Status: DC
  Administered 2012-07-11 (×2): via INTRAVENOUS

## 2012-07-11 MED ORDER — MORPHINE SULFATE 0.5 MG/ML IJ SOLN
INTRAMUSCULAR | Status: AC
  Filled 2012-07-11: qty 10

## 2012-07-11 MED ORDER — BUPIVACAINE IN DEXTROSE 0.75-8.25 % IT SOLN
INTRATHECAL | Status: DC | PRN
  Administered 2012-07-11: 11 mg via INTRATHECAL

## 2012-07-11 MED ORDER — TETANUS-DIPHTH-ACELL PERTUSSIS 5-2.5-18.5 LF-MCG/0.5 IM SUSP
0.5000 mL | Freq: Once | INTRAMUSCULAR | Status: AC
  Administered 2012-07-12: 0.5 mL via INTRAMUSCULAR
  Filled 2012-07-11: qty 0.5

## 2012-07-11 MED ORDER — SCOPOLAMINE 1 MG/3DAYS TD PT72
1.0000 | MEDICATED_PATCH | Freq: Once | TRANSDERMAL | Status: DC
  Filled 2012-07-11: qty 1

## 2012-07-11 MED ORDER — DIPHENHYDRAMINE HCL 50 MG/ML IJ SOLN: 25.0000 mg | INTRAMUSCULAR | Status: DC | PRN

## 2012-07-11 MED ORDER — FENTANYL CITRATE 0.05 MG/ML IJ SOLN: 25.0000 ug | INTRAMUSCULAR | Status: DC | PRN

## 2012-07-11 MED ORDER — ONDANSETRON HCL 4 MG PO TABS: 4.0000 mg | ORAL_TABLET | ORAL | Status: DC | PRN

## 2012-07-11 MED ORDER — KETOROLAC TROMETHAMINE 30 MG/ML IJ SOLN
INTRAMUSCULAR | Status: AC
  Administered 2012-07-11: 30 mg via INTRAVENOUS
  Filled 2012-07-11: qty 1

## 2012-07-11 MED ORDER — ONDANSETRON HCL 4 MG/2ML IJ SOLN
INTRAMUSCULAR | Status: AC
  Filled 2012-07-11: qty 2

## 2012-07-11 MED ORDER — SCOPOLAMINE 1 MG/3DAYS TD PT72
MEDICATED_PATCH | TRANSDERMAL | Status: AC
  Administered 2012-07-11: 1.5 mg via TRANSDERMAL
  Filled 2012-07-11: qty 1

## 2012-07-11 MED ORDER — ONDANSETRON HCL 4 MG/2ML IJ SOLN: 4.0000 mg | Freq: Three times a day (TID) | INTRAMUSCULAR | Status: DC | PRN

## 2012-07-11 MED ORDER — ACETAMINOPHEN 10 MG/ML IV SOLN
1000.0000 mg | Freq: Four times a day (QID) | INTRAVENOUS | Status: AC | PRN
  Filled 2012-07-11: qty 100

## 2012-07-11 MED ORDER — EPHEDRINE SULFATE 50 MG/ML IJ SOLN
INTRAMUSCULAR | Status: DC | PRN
  Administered 2012-07-11 (×4): 10 mg via INTRAVENOUS

## 2012-07-11 MED ORDER — FENTANYL CITRATE 0.05 MG/ML IJ SOLN
INTRAMUSCULAR | Status: AC
  Filled 2012-07-11: qty 2

## 2012-07-11 MED ORDER — ZOLPIDEM TARTRATE 5 MG PO TABS: 5.0000 mg | ORAL_TABLET | Freq: Every evening | ORAL | Status: DC | PRN

## 2012-07-11 MED ORDER — LACTATED RINGERS IV SOLN
INTRAVENOUS | Status: DC
  Administered 2012-07-11 (×2): via INTRAVENOUS
  Administered 2012-07-11: 125 mL/h via INTRAVENOUS

## 2012-07-11 MED ORDER — MORPHINE SULFATE (PF) 0.5 MG/ML IJ SOLN
INTRAMUSCULAR | Status: DC | PRN
  Administered 2012-07-11: .1 mg via INTRATHECAL

## 2012-07-11 MED ORDER — ONDANSETRON HCL 4 MG/2ML IJ SOLN: 4.0000 mg | INTRAMUSCULAR | Status: DC | PRN

## 2012-07-11 MED ORDER — IBUPROFEN 600 MG PO TABS
600.0000 mg | ORAL_TABLET | Freq: Four times a day (QID) | ORAL | Status: DC
  Administered 2012-07-11 – 2012-07-13 (×7): 600 mg via ORAL
  Filled 2012-07-11 (×7): qty 1

## 2012-07-11 MED ORDER — SENNOSIDES-DOCUSATE SODIUM 8.6-50 MG PO TABS
2.0000 | ORAL_TABLET | Freq: Every day | ORAL | Status: DC
  Administered 2012-07-11 – 2012-07-12 (×2): 2 via ORAL

## 2012-07-11 MED ORDER — LANOLIN HYDROUS EX OINT: 1.0000 "application " | TOPICAL_OINTMENT | CUTANEOUS | Status: DC | PRN

## 2012-07-11 MED ORDER — NALOXONE HCL 0.4 MG/ML IJ SOLN: 0.4000 mg | INTRAMUSCULAR | Status: DC | PRN

## 2012-07-11 MED ORDER — KETOROLAC TROMETHAMINE 30 MG/ML IJ SOLN: 30.0000 mg | Freq: Four times a day (QID) | INTRAMUSCULAR | Status: AC | PRN

## 2012-07-11 MED ORDER — SIMETHICONE 80 MG PO CHEW: 80.0000 mg | CHEWABLE_TABLET | ORAL | Status: DC | PRN

## 2012-07-11 MED ORDER — MIDAZOLAM HCL 2 MG/2ML IJ SOLN: 0.5000 mg | Freq: Once | INTRAMUSCULAR | Status: DC | PRN

## 2012-07-11 MED ORDER — FENTANYL CITRATE 0.05 MG/ML IJ SOLN
INTRAMUSCULAR | Status: DC | PRN
  Administered 2012-07-11: 25 ug via INTRATHECAL

## 2012-07-11 MED ORDER — NALOXONE HCL 1 MG/ML IJ SOLN
1.0000 ug/kg/h | INTRAMUSCULAR | Status: DC | PRN
  Filled 2012-07-11: qty 2

## 2012-07-11 MED ORDER — OXYTOCIN 10 UNIT/ML IJ SOLN
40.0000 [IU] | INTRAVENOUS | Status: DC | PRN
  Administered 2012-07-11: 40 [IU] via INTRAVENOUS

## 2012-07-11 MED ORDER — SODIUM CHLORIDE 0.9 % IJ SOLN: 3.0000 mL | INTRAMUSCULAR | Status: DC | PRN

## 2012-07-11 MED ORDER — SCOPOLAMINE 1 MG/3DAYS TD PT72
1.0000 | MEDICATED_PATCH | Freq: Once | TRANSDERMAL | Status: DC
  Administered 2012-07-11: 1.5 mg via TRANSDERMAL

## 2012-07-11 MED ORDER — MENTHOL 3 MG MT LOZG: 1.0000 | LOZENGE | OROMUCOSAL | Status: DC | PRN

## 2012-07-11 MED ORDER — SIMETHICONE 80 MG PO CHEW
80.0000 mg | CHEWABLE_TABLET | Freq: Three times a day (TID) | ORAL | Status: DC
  Administered 2012-07-11 – 2012-07-13 (×6): 80 mg via ORAL

## 2012-07-11 MED ORDER — DIPHENHYDRAMINE HCL 50 MG/ML IJ SOLN: 12.5000 mg | INTRAMUSCULAR | Status: DC | PRN

## 2012-07-11 MED ORDER — METOCLOPRAMIDE HCL 5 MG/ML IJ SOLN: 10.0000 mg | Freq: Three times a day (TID) | INTRAMUSCULAR | Status: DC | PRN

## 2012-07-11 MED ORDER — KETOROLAC TROMETHAMINE 30 MG/ML IJ SOLN
30.0000 mg | Freq: Four times a day (QID) | INTRAMUSCULAR | Status: AC | PRN
  Administered 2012-07-11 (×2): 30 mg via INTRAVENOUS
  Filled 2012-07-11: qty 1

## 2012-07-11 MED ORDER — WITCH HAZEL-GLYCERIN EX PADS: 1.0000 "application " | MEDICATED_PAD | CUTANEOUS | Status: DC | PRN

## 2012-07-11 MED ORDER — PHENYLEPHRINE HCL 10 MG/ML IJ SOLN
INTRAMUSCULAR | Status: DC | PRN
  Administered 2012-07-11 (×2): 80 ug via INTRAVENOUS

## 2012-07-11 MED ORDER — EPHEDRINE 5 MG/ML INJ
INTRAVENOUS | Status: AC
  Filled 2012-07-11: qty 10

## 2012-07-11 MED ORDER — CEFAZOLIN SODIUM-DEXTROSE 2-3 GM-% IV SOLR
2.0000 g | INTRAVENOUS | Status: AC
  Administered 2012-07-11: 2 g via INTRAVENOUS

## 2012-07-11 SURGICAL SUPPLY — 28 items
BENZOIN TINCTURE PRP APPL 2/3 (GAUZE/BANDAGES/DRESSINGS) ×2 IMPLANT
CLIP FILSHIE TUBAL LIGA STRL (Clip) ×2 IMPLANT
CLOTH BEACON ORANGE TIMEOUT ST (SAFETY) ×2 IMPLANT
DRAPE LG THREE QUARTER DISP (DRAPES) ×2 IMPLANT
DRSG OPSITE POSTOP 4X10 (GAUZE/BANDAGES/DRESSINGS) ×2 IMPLANT
DURAPREP 26ML APPLICATOR (WOUND CARE) ×2 IMPLANT
ELECT REM PT RETURN 9FT ADLT (ELECTROSURGICAL) ×2
ELECTRODE REM PT RTRN 9FT ADLT (ELECTROSURGICAL) ×1 IMPLANT
GLOVE BIO SURGEON ST LM GN SZ9 (GLOVE) ×2 IMPLANT
GLOVE BIOGEL PI IND STRL 7.0 (GLOVE) ×2 IMPLANT
GLOVE BIOGEL PI IND STRL 9 (GLOVE) ×1 IMPLANT
GLOVE BIOGEL PI INDICATOR 7.0 (GLOVE) ×2
GLOVE BIOGEL PI INDICATOR 9 (GLOVE) ×1
GOWN PREVENTION PLUS LG XLONG (DISPOSABLE) ×2 IMPLANT
GOWN STRL REIN 3XL LVL4 (GOWN DISPOSABLE) ×2 IMPLANT
GOWN STRL REIN XL XLG (GOWN DISPOSABLE) ×2 IMPLANT
NS IRRIG 1000ML POUR BTL (IV SOLUTION) ×2 IMPLANT
PACK C SECTION WH (CUSTOM PROCEDURE TRAY) ×2 IMPLANT
PAD OB MATERNITY 4.3X12.25 (PERSONAL CARE ITEMS) ×2 IMPLANT
STRIP CLOSURE SKIN 1/2X4 (GAUZE/BANDAGES/DRESSINGS) ×2 IMPLANT
SUT CHROMIC 0 CTX 36 (SUTURE) ×4 IMPLANT
SUT VIC AB 0 CT1 27 (SUTURE) ×1
SUT VIC AB 0 CT1 27XBRD ANBCTR (SUTURE) ×1 IMPLANT
SUT VIC AB 2-0 CT1 27 (SUTURE) ×2
SUT VIC AB 2-0 CT1 TAPERPNT 27 (SUTURE) ×2 IMPLANT
SUT VIC AB 4-0 KS 27 (SUTURE) ×2 IMPLANT
TOWEL OR 17X24 6PK STRL BLUE (TOWEL DISPOSABLE) ×4 IMPLANT
TRAY FOLEY CATH 14FR (SET/KITS/TRAYS/PACK) ×2 IMPLANT

## 2012-07-11 NOTE — Op Note (Signed)
I was involved in the entire procedure, which was performed under my direct involvement and decision making.

## 2012-07-11 NOTE — H&P (View-Only) (Signed)
Kristine Adams is a 26 y.o. female presenting for repeat cesarean section and tubal ligation on 07/11/2012 . Prenatal course has been followed at [facility] beginning in August and is notable for blood type B-positive antibody screen negative rubella immunity present hemoglobin 11, and declined to 10.7 at 28 weeks hepatitis HIV RPR GC and Chlamydia cultures all negative Pap smear is negative glucose tolerance test normal at 77, 147, 86. She desires spinal anesthesia takes her baby to [facility] plans to bottle supplementation and desires permanent sterilization.. She speaks Spanish with some English (comprehended well).. Maternal Medical History:  Prenatal Complications - Diabetes: none.    OB History    Grav Para Term Preterm Abortions TAB SAB Ect Mult Living   4 3 3 0 0 0 0 0 0 3     No past medical history on file. Past Surgical History  Procedure Date  . Cesarean section    Family History: family history is not on file. Social History:  reports that she has never smoked. She does not have any smokeless tobacco history on file. She reports that she does not drink alcohol or use illicit drugs.   Prenatal Transfer Tool  Maternal Diabetes: No Genetic Screening: Normal Maternal Ultrasounds/Referrals: Normal Fetal Ultrasounds or other Referrals:  None Maternal Substance Abuse:  No Significant Maternal Medications:  Meds include: Other:  betamethasone received x1 dose at 32 weeks when membrane rupture was suspected and proven to be false-positive. Significant Maternal Lab Results:  Lab values include: Other: GBS pending  Other Comments:  evaluated at 32 weeks, suspected as having srom, given initial BMZ dose, 12 mg im, and then further review and And Amnisure were negative  Review of Systems  Constitutional: Negative.       Last menstrual period 09/21/2011. Exam weight 150 and three-quarter pounds on 12:30, BP 100/68 Physical Exam  Constitutional: She is  oriented to person, place, and time. She appears well-developed and well-nourished.  HENT:  Head: Normocephalic and atraumatic.  Eyes: Pupils are equal, round, and reactive to light.  Neck: Normal range of motion. Neck supple. No thyromegaly present.  Cardiovascular: Normal rate and regular rhythm.   Respiratory: Effort normal and breath sounds normal.  GI: Soft.       Gravid uterus consistent with dates  Neurological: She is alert and oriented to person, place, and time. She has normal reflexes.  Psychiatric: She has a normal mood and affect. Her behavior is normal. Thought content normal.    Prenatal labs: ABO, Rh:   see history of present illness Antibody:   Rubella:   RPR:    HBsAg:    HIV:    GBS:     Assessment/Plan: Pregnancy 39 weeks and repeat cesarean section not for trial of labor, desiring repeat cesarean section  desire for permanent sterilization   Repeat cesarean section and tubal ligation scheduled for Monday, 07/11/2012  Jakayden Cancio V 06/26/2012, 3:15 PM      

## 2012-07-11 NOTE — Anesthesia Procedure Notes (Signed)
Spinal  Patient location during procedure: OR Start time: 07/11/2012 9:55 AM Staffing Anesthesiologist: Angus Seller., Harrell Gave. Performed by: anesthesiologist  Preanesthetic Checklist Completed: patient identified, site marked, surgical consent, pre-op evaluation, timeout performed, IV checked, risks and benefits discussed and monitors and equipment checked Spinal Block Patient position: sitting Prep: DuraPrep Patient monitoring: heart rate, cardiac monitor, continuous pulse ox and blood pressure Approach: midline Location: L3-4 Injection technique: single-shot Needle Needle type: Sprotte  Needle gauge: 24 G Needle length: 9 cm Assessment Sensory level: T4 Additional Notes Patient identified.  Risk benefits discussed including failed block, incomplete pain control, headache, nerve damage, paralysis, blood pressure changes, nausea, vomiting, reactions to medication both toxic or allergic, and postpartum back pain.  Patient expressed understanding and wished to proceed.  All questions were answered.  Sterile technique used throughout procedure.  CSF was clear.  No parasthesia or other complications.  Please see nursing notes for vital signs.

## 2012-07-11 NOTE — Anesthesia Postprocedure Evaluation (Signed)
Anesthesia Post Note  Patient: Kristine Adams  Procedure(s) Performed: Procedure(s) (LRB): CESAREAN SECTION WITH BILATERAL TUBAL LIGATION (Bilateral)  Anesthesia type: Spinal  Patient location: PACU  Post pain: Pain level controlled  Post assessment: Post-op Vital signs reviewed  Last Vitals:  Filed Vitals:   07/11/12 1228  BP: 103/56  Pulse: 59  Temp: 36.6 C  Resp: 20    Post vital signs: Reviewed  Level of consciousness: awake  Complications: No apparent anesthesia complications

## 2012-07-11 NOTE — Addendum Note (Signed)
Addendum  created 07/11/12 1324 by Renford Dills, CRNA   Modules edited:Notes Section

## 2012-07-11 NOTE — Transfer of Care (Signed)
Immediate Anesthesia Transfer of Care Note  Patient: Kristine Adams  Procedure(s) Performed: Procedure(s) (LRB) with comments: CESAREAN SECTION WITH BILATERAL TUBAL LIGATION (Bilateral) - speaks spanish, interpreter needed  Patient Location: PACU  Anesthesia Type:Spinal  Level of Consciousness: awake and alert   Airway & Oxygen Therapy: Patient Spontanous Breathing  Post-op Assessment: Report given to PACU RN  Post vital signs: Reviewed  Complications: No apparent anesthesia complications

## 2012-07-11 NOTE — Preoperative (Signed)
Beta Blockers   Reason not to administer Beta Blockers:Not Applicable 

## 2012-07-11 NOTE — Interval H&P Note (Signed)
History and Physical Interval Note:  07/11/2012 9:21 AM  Kristine Adams  has presented today for surgery, with the diagnosis of pregnancy 39 weeks, undelivered, prior cesarean x 3, for repeat,  Desire for sterilization  The various methods of treatment have been discussed with the patient and family. After consideration of risks, benefits and other options for treatment, the patient has consented to  Procedure(s) (LRB) with comments: CESAREAN SECTION WITH BILATERAL TUBAL LIGATION (Bilateral) - speaks spanish, interpreter needed. Patient declines interpreter upon specific questioning. as a surgical intervention .  The patient's history has been reviewed, patient examined, no change in status, stable for surgery.  I have reviewed the patient's chart and labs.  Questions were answered to the patient's satisfaction.     Tilda Burrow

## 2012-07-11 NOTE — Anesthesia Postprocedure Evaluation (Signed)
  Anesthesia Post-op Note  Patient: Kristine Adams  Procedure(s) Performed: Procedure(s) (LRB) with comments: CESAREAN SECTION WITH BILATERAL TUBAL LIGATION (Bilateral) - speaks spanish, interpreter needed  Patient Location: Mother/Baby  Anesthesia Type:Spinal  Level of Consciousness: awake  Airway and Oxygen Therapy: Patient Spontanous Breathing  Post-op Pain: mild  Post-op Assessment: Patient's Cardiovascular Status Stable and Respiratory Function Stable  Post-op Vital Signs: stable  Complications: No apparent anesthesia complications

## 2012-07-11 NOTE — Anesthesia Preprocedure Evaluation (Signed)

## 2012-07-11 NOTE — Op Note (Signed)
[name]   PROCEDURE DATE: 07/11/2012  PREOPERATIVE DIAGNOSIS: Intrauterine pregnancy at [redacted]w[redacted]d gestation; previous cesarean section x 3; undesired fertility  POSTOPERATIVE DIAGNOSIS: The same with scar revision  PROCEDURE: Repeat Low Transverse Cesarean Section, Bilateral Tubal Sterilization using Filshie clips  SURGEON:  Christin Bach, MD  ASSISTANT:  Napoleon Form, MD  INDICATIONS: Kristine Adams is a 27 y.o. [id number] at [redacted]w[redacted]d here for cesarean section and bilateral tubal sterilization secondary to the indications listed under preoperative diagnosis; please see preoperative note for further details.  The risks ofsurgery were discussed with the patient including but were not limited to: bleeding which may require transfusion or reoperation; infection which may require antibiotics; injury to bowel, bladder, ureters or other surrounding organs; injury to the fetus; need for additional procedures including hysterectomy in the event of a life-threatening hemorrhage; placental abnormalities wth subsequent pregnancies, incisional problems, thromboembolic phenomenon and other postoperative/anesthesia complications.  Patient also desires permanent sterilization.  Other reversible forms of contraception were discussed with patient; she declines all other modalities. Risks of procedure discussed with patient including but not limited to: risk of regret, permanence of method, bleeding, infection, injury to surrounding organs and need for additional procedures.  Failure risk of 0.5-1% with increased risk of ectopic gestation if pregnancy occurs was also discussed with patient.  The patient concurred with the proposed plan, giving informed written consent for the procedures.    FINDINGS:  Viable female infant in cephalic presentation.  Apgars 8, 8, and 9.  Clear amniotic fluid.  Intact placenta, three vessel cord.  Normal uterus, fallopian tubes and ovaries bilaterally.  ANESTHESIA:  Spinal INTRAVENOUS FLUIDS: 2600  ml ESTIMATED BLOOD LOSS: 700 ml URINE OUTPUT:  200 ml SPECIMENS: L&D COMPLICATIONS: None immediate  PROCEDURE IN DETAIL:  The patient received intravenous antibiotics and had sequential compression devices applied to her lower extremities in the pre-op area. She taken to the operating room where spinal anesthesia was administered and was found to be adequate. She placed in a dorsal supine position with a leftward tilt, and prepped and draped in a sterile manner.  A foley catheter was placed into her bladder and attached to constant gravity.  After an adequate timeout was performed, a Pfannenstiel skin incision was made with scalpel. The previous cesarean section scar was excised with Mayo scissors. The skin incision was then carried through to the underlying layer of fascia with a scalpel. The fascia was incised in the midline, and this incision was extended bilaterally using the Mayo scissors.  Kocher clamps were applied to the superior aspect of the fascial incision and the underlying rectus muscles were dissected off bluntly and with electrocautery. A similar process was carried out on the inferior aspect of the fascial incision. The rectus muscles were separated in the midline bluntly and the peritoneum was entered bluntly and sharply. A bladder blade was placed. Attention was turned to the lower uterine segment, which was very thin (amniotic fluid and hair could be seen through the uterine wall in places). A low transverse hysterotomy was made with a scalpel and extended bilaterally bluntly.  The infant was successfully delivered, the cord was clamped and cut and the infant was handed over to awaiting neonatology team. Uterine massage was then administered, and the placenta delivered intact with a three-vessel cord. The uterus was then cleared of clot and debris.  A small (2 cm) extension was noted on the right side of the hysterotomy. The extension was closed with 0  Chromic in running, locked fashion.  The hysterotomy was also closed with 0 Chromic in a running locked fashion in a single layer. Excellent hemostasis was noted. Attention was then turned to the fallopian tubes.  A Filshie clip was placed on both tubes, about 3 cm from the cornua, with care given to incorporate the underlying mesosalpinx on both sides, allowing for bilateral tubal sterilization. The pelvis was copiously irrigated and cleared of all clot and debris. Hemostasis was confirmed on all surfaces.  The peritoneum and the muscles were reapproximated using 2-0 Vicryl in a running suture. The fascia was then closed using 0 Vicryl in a running fashion.  The subcutaneous layer was irrigated, then reapproximated with 2-0 plain gut interrupted stitches, and the skin was closed with a 4-0 Vicryl subcuticular stitch. The patient tolerated the procedure well. Sponge, lap, instrument and needle counts were correct x 2.  The foley catheter drained clear urine throughout the procedure. The patient was taken to the recovery room in stable condition.   Napoleon Form, MD 07/11/2012 11:15 AM

## 2012-07-12 ENCOUNTER — Encounter (HOSPITAL_COMMUNITY): Payer: Self-pay | Admitting: Obstetrics and Gynecology

## 2012-07-12 LAB — TYPE AND SCREEN
ABO/RH(D): B POS
Unit division: 0

## 2012-07-12 LAB — CBC
HCT: 28.4 % — ABNORMAL LOW (ref 36.0–46.0)
Hemoglobin: 8.9 g/dL — ABNORMAL LOW (ref 12.0–15.0)
MCHC: 31.3 g/dL (ref 30.0–36.0)
MCV: 77.6 fL — ABNORMAL LOW (ref 78.0–100.0)

## 2012-07-12 NOTE — Progress Notes (Signed)
Subjective: Postpartum Day 1: Cesarean Delivery Patient reports tolerating PO, + flatus and no problems voiding.  Also no lightheadedness, no nausea, no fever, chills, SOB.  Minimal incision pain but no redness around the area.  No BM yet.   Objective: Vital signs in last 24 hours: Temp:  [97.7 F (36.5 C)-98.6 F (37 C)] 98.6 F (37 C) (01/21 0530) Pulse Rate:  [59-95] 73  (01/21 0530) Resp:  [18-22] 18  (01/21 0530) BP: (82-103)/(37-60) 95/59 mmHg (01/21 0530) SpO2:  [94 %-100 %] 96 % (01/21 0530) Weight:  [71.668 kg (158 lb)] 71.668 kg (158 lb) (01/20 1249)  Physical Exam:  General: alert, cooperative and appears stated age 27: appropriate Uterine Fundus: firm Incision: healing well, no significant drainage, no dehiscence, no significant erythema DVT Evaluation: No evidence of DVT seen on physical exam.   Basename 07/12/12 0600  HGB 8.9*  HCT 28.4*    Assessment/Plan: Status post Cesarean section. Doing well postoperatively.  Continue current care.  Pt received BTL during CS as well.   Twana First Paulina Fusi, DO of [facility] 07/12/2012, 7:42 AM

## 2012-07-12 NOTE — Progress Notes (Signed)
I saw and examined patient and agree with resident note. Patient ambulating, pain controlled. Napoleon Form, MD

## 2012-07-12 NOTE — Discharge Summary (Signed)
Kristine Adams is a 27 y.o. female who presented for repeat cesarean section and tubal ligation on 07/11/2012.  The pt was taken for LTCS at same incision site due to no desire to have VBAC.  Pt tolerated the C-section well and also had a BTL for sterilization, as pt desired permanent sterilization.  A viable female was delivered with Apgars of 8,9, an intact placenta with three vessel cord was delivered shortly thereafter.  Pt received epidural prior to procedure and EBL was 700 mL.  Pt desires to breast and bottle feed.     Obstetric Discharge Summary Reason for Admission: cesarean section Prenatal Procedures: none Intrapartum Procedures: cesarean: low cervical, transverse Postpartum Procedures: none Complications-Operative and Postpartum: none Hemoglobin  Date Value Range Status  07/12/2012 8.9* 12.0 - 15.0 g/dL Final     HCT  Date Value Range Status  07/12/2012 28.4* 36.0 - 46.0 % Final    Physical Exam:  General: alert, cooperative and appears stated age 73: appropriate Uterine Fundus: firm Incision: healing well, no significant drainage, no dehiscence, no significant erythema DVT Evaluation: No evidence of DVT seen on physical exam.  Discharge Diagnoses: Term Pregnancy-delivered  Discharge Information: Date: 07/12/2012 Activity: pelvic rest Diet: routine Medications: Ibuprofen and Percocet Condition: stable Instructions: refer to practice specific booklet Discharge to: home   Newborn Data: Live born female  Birth Weight: 6 lb 10.2 oz (3010 g) APGAR: 8, 8  Home with mother.  Twana First Hess, DO of [facility] 07/13/2012, 7:32 AM  I have seen and examined this patient and I agree with the above. Cam Hai 7:56 AM 07/13/2012

## 2012-07-12 NOTE — Progress Notes (Signed)
UR completed 

## 2012-07-13 MED ORDER — IBUPROFEN 600 MG PO TABS: 600.0000 mg | ORAL_TABLET | Freq: Four times a day (QID) | ORAL | Status: DC

## 2012-07-13 MED ORDER — SENNOSIDES-DOCUSATE SODIUM 8.6-50 MG PO TABS: 2.0000 | ORAL_TABLET | Freq: Two times a day (BID) | ORAL | Status: DC | PRN

## 2012-07-13 MED ORDER — OXYCODONE-ACETAMINOPHEN 5-325 MG PO TABS: 1.0000 | ORAL_TABLET | ORAL | Status: DC | PRN

## 2012-07-13 MED ORDER — DIBUCAINE 1 % RE OINT: 1.0000 "application " | TOPICAL_OINTMENT | RECTAL | Status: DC | PRN

## 2012-07-13 NOTE — Discharge Summary (Signed)
Attestation of Attending Supervision of Advanced Practitioner (CNM/NP): Evaluation and management procedures were performed by the Advanced Practitioner under my supervision and collaboration.  I have reviewed the Advanced Practitioner's note and chart, and I agree with the management and plan.  Briley Sulton 07/13/2012 9:40 AM   

## 2014-04-23 ENCOUNTER — Encounter (HOSPITAL_COMMUNITY): Payer: Self-pay | Admitting: Obstetrics and Gynecology

## 2017-04-01 ENCOUNTER — Emergency Department (HOSPITAL_COMMUNITY): Payer: Self-pay

## 2017-04-01 ENCOUNTER — Emergency Department (HOSPITAL_COMMUNITY)
Admission: EM | Admit: 2017-04-01 | Discharge: 2017-04-01 | Disposition: A | Discharge status: Home / Self Care | Payer: Self-pay | Attending: Emergency Medicine | Admitting: Emergency Medicine

## 2017-04-01 ENCOUNTER — Encounter (HOSPITAL_COMMUNITY): Payer: Self-pay | Admitting: Emergency Medicine

## 2017-04-01 DIAGNOSIS — R0602 Shortness of breath: Secondary | ICD-10-CM | POA: Insufficient documentation

## 2017-04-01 DIAGNOSIS — R42 Dizziness and giddiness: Secondary | ICD-10-CM | POA: Insufficient documentation

## 2017-04-01 DIAGNOSIS — Z79899 Other long term (current) drug therapy: Secondary | ICD-10-CM | POA: Insufficient documentation

## 2017-04-01 DIAGNOSIS — R5383 Other fatigue: Secondary | ICD-10-CM | POA: Insufficient documentation

## 2017-04-01 LAB — TYPE AND SCREEN
ABO/RH(D): B POS
ANTIBODY SCREEN: NEGATIVE

## 2017-04-01 LAB — CBC WITH DIFFERENTIAL/PLATELET
BASOS PCT: 0 %
Basophils Absolute: 0 10*3/uL (ref 0.0–0.1)
Eosinophils Absolute: 0.1 10*3/uL (ref 0.0–0.7)
Eosinophils Relative: 1 %
HCT: 36.9 % (ref 36.0–46.0)
Hemoglobin: 11.2 g/dL — ABNORMAL LOW (ref 12.0–15.0)
LYMPHS ABS: 3.1 10*3/uL (ref 0.7–4.0)
Lymphocytes Relative: 45 %
MCH: 19.4 pg — AB (ref 26.0–34.0)
MCHC: 30.4 g/dL (ref 30.0–36.0)
MCV: 64.1 fL — ABNORMAL LOW (ref 78.0–100.0)
MONO ABS: 0.4 10*3/uL (ref 0.1–1.0)
Monocytes Relative: 6 %
NEUTROS ABS: 3.3 10*3/uL (ref 1.7–7.7)
Neutrophils Relative %: 48 %
PLATELETS: 430 10*3/uL — AB (ref 150–400)
RBC: 5.76 MIL/uL — ABNORMAL HIGH (ref 3.87–5.11)
RDW: 17.7 % — AB (ref 11.5–15.5)
WBC: 6.9 10*3/uL (ref 4.0–10.5)

## 2017-04-01 LAB — COMPREHENSIVE METABOLIC PANEL
ALK PHOS: 85 U/L (ref 38–126)
ALT: 42 U/L (ref 14–54)
ANION GAP: 10 (ref 5–15)
AST: 33 U/L (ref 15–41)
Albumin: 4.5 g/dL (ref 3.5–5.0)
BUN: 11 mg/dL (ref 6–20)
CHLORIDE: 103 mmol/L (ref 101–111)
CO2: 25 mmol/L (ref 22–32)
Calcium: 9.7 mg/dL (ref 8.9–10.3)
Creatinine, Ser: 0.83 mg/dL (ref 0.44–1.00)
GFR calc Af Amer: >60 mL/min (ref >60)
GFR calc non Af Amer: >60 mL/min (ref >60)
GLUCOSE: 107 mg/dL — AB (ref 65–99)
POTASSIUM: 3.3 mmol/L — AB (ref 3.5–5.1)
SODIUM: 138 mmol/L (ref 135–145)
TOTAL PROTEIN: 8.8 g/dL — AB (ref 6.5–8.1)
Total Bilirubin: 0.5 mg/dL (ref 0.3–1.2)

## 2017-04-01 LAB — URINALYSIS, ROUTINE W REFLEX MICROSCOPIC
BILIRUBIN URINE: NEGATIVE
Glucose, UA: NEGATIVE mg/dL
KETONES UR: NEGATIVE mg/dL
LEUKOCYTES UA: NEGATIVE
Nitrite: POSITIVE — AB
PH: 5 (ref 5.0–8.0)
Protein, ur: NEGATIVE mg/dL
Specific Gravity, Urine: 1.012 (ref 1.005–1.030)

## 2017-04-01 LAB — PREGNANCY, URINE: PREG TEST UR: NEGATIVE

## 2017-04-01 LAB — D-DIMER, QUANTITATIVE: D-Dimer, Quant: 0.88 ug/mL-FEU — ABNORMAL HIGH (ref 0.00–0.50)

## 2017-04-01 MED ORDER — IOPAMIDOL (ISOVUE-370) INJECTION 76%
100.0000 mL | Freq: Once | INTRAVENOUS | Status: AC | PRN
  Administered 2017-04-01: 100 mL via INTRAVENOUS

## 2017-04-01 MED ORDER — SODIUM CHLORIDE 0.9 % IV BOLUS (SEPSIS)
1000.0000 mL | Freq: Once | INTRAVENOUS | Status: AC
  Administered 2017-04-01: 1000 mL via INTRAVENOUS

## 2017-04-01 MED ORDER — ALBUTEROL SULFATE HFA 108 (90 BASE) MCG/ACT IN AERS
2.0000 | INHALATION_SPRAY | Freq: Once | RESPIRATORY_TRACT | Status: AC
  Administered 2017-04-01: 2 via RESPIRATORY_TRACT
  Filled 2017-04-01: qty 6.7

## 2017-04-01 MED ORDER — ACETAMINOPHEN 325 MG PO TABS
650.0000 mg | ORAL_TABLET | Freq: Once | ORAL | Status: AC
  Administered 2017-04-01: 650 mg via ORAL
  Filled 2017-04-01: qty 2

## 2017-04-01 NOTE — ED Notes (Signed)
Patient transported to X-ray 

## 2017-04-01 NOTE — ED Provider Notes (Signed)
[facility] DEPT Provider Note   CSN: [id number] Arrival date & time: 04/01/17  1334     History   Chief Complaint Chief Complaint  Patient presents with  . Cough  . Fatigue  . Dizziness    HPI Kristine Adams is a 31 y.o. female.  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  HPI Patient presents the emergency department with cough and a feeling as though she needs to continue occasionally taking a deep breath.  She's had some sinus congestion.  She also reports lightheadedness and dizziness for 2 weeks.  She's had intermittent episodes of menorrhagia for 4-6 months.  She is on iron supplementation.  She was seen at the health department today and recommended that she come to the ER for IV fluids and blood testing.  Patient reports no abdominal pain.  Denies back pain.  No unilateral leg swelling.  No history DVT or pulmonary embolism.  No history of asthma.  She states that she can walk without a significant shortness of breath but feels like she occasionally is deposits and take a deep breath to feel like she is getting a full breath.Denies palpitations.  No recent travel or surgery.  Denies blood in her stool.  Reports fever several days ago but no fever now.       Past Medical History:  Diagnosis Date  . No pertinent past medical history     There are no active problems to display for this patient.   Past Surgical History:  Procedure Laterality Date  . CESAREAN SECTION    . CESAREAN SECTION WITH BILATERAL TUBAL LIGATION  07/11/2012   Procedure: CESAREAN SECTION WITH BILATERAL TUBAL LIGATION;  Surgeon: Tilda Burrow, MD;  Location: [facility];  Service: Obstetrics;  Laterality: Bilateral;  speaks spanish, interpreter needed    OB History    Gravida Para Term Preterm AB Living   0 1 4   SAB TAB Ectopic Multiple Live Births   1 0 0 0 4       Home Medications    Prior to Admission medications   Medication Sig Start Date End Date Taking? Authorizing Provider    ibuprofen (ADVIL,MOTRIN) 200 MG tablet Take 200 mg by mouth every 6 (six) hours as needed for mild pain or moderate pain.   Yes [provider]  Multiple Vitamin (MULTIVITAMIN WITH MINERALS) TABS tablet Take 1 tablet by mouth daily.   Yes [provider]  Omega-3 Fatty Acids (FISH OIL) 1000 MG CAPS Take 1 capsule by mouth daily.   Yes [provider]    Family History No family history on file.  Social History Social History  Substance Use Topics  . Smoking status: Never Smoker  . Smokeless tobacco: Never Used  . Alcohol use No     Allergies   Patient has no known allergies.   Review of Systems Review of Systems  All other systems reviewed and are negative.    Physical Exam Updated Vital Signs BP 120/82 (BP Location: Right Arm)   Pulse 96   Temp 98.7 F (37.1 C) (Oral)   Resp 18   Wt 75.8 kg (167 lb)   LMP 03/25/2017   SpO2 100%   BMI 31.55 kg/m   Physical Exam  Constitutional: She is oriented to person, place, and time. She appears well-developed and well-nourished. No distress.  HENT:  Head: Normocephalic and atraumatic.  Eyes: EOM are normal.  Neck: Normal range of motion.  Cardiovascular: Normal rate, regular rhythm and normal  heart sounds.   Pulmonary/Chest: Effort normal and breath sounds normal.  Abdominal: Soft. She exhibits no distension. There is no tenderness.  Musculoskeletal: Normal range of motion.  Neurological: She is alert and oriented to person, place, and time.  Skin: Skin is warm and dry.  Psychiatric: She has a normal mood and affect. Judgment normal.  Nursing note and vitals reviewed.    ED Treatments / Results  Labs (all labs ordered are listed, but only abnormal results are displayed) Labs Reviewed  CBC WITH DIFFERENTIAL/PLATELET - Abnormal; Notable for the following:       Result Value   RBC 5.76 (*)    Hemoglobin 11.2 (*)    MCV 64.1 (*)    MCH 19.4 (*)    RDW 17.7 (*)    Platelets 430 (*)     All other components within normal limits  COMPREHENSIVE METABOLIC PANEL - Abnormal; Notable for the following:    Potassium 3.3 (*)    Glucose, Bld 107 (*)    Total Protein 8.8 (*)    All other components within normal limits  URINALYSIS, ROUTINE W REFLEX MICROSCOPIC - Abnormal; Notable for the following:    APPearance HAZY (*)    Hgb urine dipstick MODERATE (*)    Nitrite POSITIVE (*)    Bacteria, UA RARE (*)    Squamous Epithelial / LPF 6-30 (*)    All other components within normal limits  D-DIMER, QUANTITATIVE (NOT AT [facility]) - Abnormal; Notable for the following:    D-Dimer, Quant 0.88 (*)    All other components within normal limits  PREGNANCY, URINE  TYPE AND SCREEN    EKG  EKG Interpretation  Date/Time:  Thursday April 01 2017 14:06:29 EDT Ventricular Rate:  75 PR Interval:    QRS Duration: 82 QT Interval:  379 QTC Calculation: 424 R Axis:   38 Text Interpretation:  Sinus rhythm Probable left atrial enlargement Low voltage, precordial leads Consider anterior infarct No old tracing to compare Confirmed by Azalia Bilis ([id number]) on 04/01/2017 6:55:24 PM       Radiology Dg Chest 2 View  Result Date: 04/01/2017 CLINICAL DATA:  Cough, shortness of breath. EXAM: CHEST  2 VIEW COMPARISON:  None. FINDINGS: The heart size and mediastinal contours are within normal limits. Both lungs are clear. The visualized skeletal structures are unremarkable. IMPRESSION: No active cardiopulmonary disease. Electronically Signed   By: Obie Dredge M.D.   On: 04/01/2017 15:04   Ct Angio Chest Pe W And/or Wo Contrast  Result Date: 04/01/2017 CLINICAL DATA:  Cough fever and lightheadedness for 2 weeks. EXAM: CT ANGIOGRAPHY CHEST WITH CONTRAST TECHNIQUE: Multidetector CT imaging of the chest was performed using the standard protocol during bolus administration of intravenous contrast. Multiplanar CT image reconstructions and MIPs were obtained to evaluate the vascular anatomy. CONTRAST:   100 mL Isovue 370 intravenous COMPARISON:  None. FINDINGS: Cardiovascular: Satisfactory opacification of the pulmonary arteries to the segmental level. No evidence of pulmonary embolism. Normal heart size. No pericardial effusion. Mediastinum/Nodes: No enlarged mediastinal, hilar, or axillary lymph nodes. Thyroid gland, trachea, and esophagus demonstrate no significant findings. Lungs/Pleura: Lungs are clear. No pleural effusion or pneumothorax. Upper Abdomen: No acute abnormality. Musculoskeletal: No chest wall abnormality. No acute or significant osseous findings. Review of the MIP images confirms the above findings. IMPRESSION: Negative for pulmonary embolism. No significant abnormality. Electronically Signed   By: Ellery Plunk M.D.   On: 04/01/2017 18:14    Procedures Procedures (including critical care time)  Medications Ordered in ED Medications  sodium chloride 0.9 % bolus 1,000 mL (not administered)     Initial Impression / Assessment and Plan / ED Course  I have reviewed the triage vital signs and the nursing notes.  Pertinent labs & imaging results that were available during my care of the patient were reviewed by me and considered in my medical decision making (see chart for details).     Patient is overall well-appearing.  D-dimer elevated but CTA  negative for PE or other Acute pathology.  Patient be discharged home.  Her primary care follow-up.  Bronchodilator.   Final Clinical Impressions(s) / ED Diagnoses   Final diagnoses:  Shortness of breath    New Prescriptions New Prescriptions   No medications on file     Azalia Bilis, MD 04/03/17 [phone]

## 2017-04-01 NOTE — ED Notes (Signed)
Patient transported to CT 

## 2017-04-01 NOTE — ED Triage Notes (Signed)
Patient complains of cough, sinus congestion, fever, dizziness x 2 weeks. Was sent to ED by [facility] at health department.  She has positive orthostatics. 88/56 lying, 106/70 sitting, 96/66 standing from health department.

## 2018-03-15 ENCOUNTER — Other Ambulatory Visit (HOSPITAL_COMMUNITY): Payer: Self-pay | Admitting: *Deleted

## 2018-03-15 DIAGNOSIS — N921 Excessive and frequent menstruation with irregular cycle: Secondary | ICD-10-CM

## 2018-03-21 ENCOUNTER — Ambulatory Visit (HOSPITAL_COMMUNITY)
Admission: RE | Admit: 2018-03-21 | Discharge: 2018-03-21 | Disposition: A | Discharge status: Home / Self Care | Payer: Self-pay | Source: Ambulatory Visit | Attending: *Deleted | Admitting: *Deleted

## 2018-03-21 ENCOUNTER — Ambulatory Visit (HOSPITAL_COMMUNITY): Payer: Self-pay

## 2018-03-21 DIAGNOSIS — N921 Excessive and frequent menstruation with irregular cycle: Secondary | ICD-10-CM | POA: Insufficient documentation

## 2018-03-21 DIAGNOSIS — D509 Iron deficiency anemia, unspecified: Secondary | ICD-10-CM | POA: Insufficient documentation

## 2018-04-07 ENCOUNTER — Encounter: Payer: Self-pay | Admitting: Obstetrics and Gynecology

## 2018-05-27 IMAGING — CT CT ANGIO CHEST
2 of 6 series · 19 of 46 positions shown · IV contrast (Isovue)
Comparison: None.

CLINICAL DATA: Cough fever and lightheadedness for 2 weeks.

EXAM:
CT ANGIOGRAPHY CHEST WITH CONTRAST
TECHNIQUE: Multidetector CT imaging of the chest was performed using the
standard protocol during bolus administration of intravenous
contrast. Multiplanar CT image reconstructions and MIPs were
obtained to evaluate the vascular anatomy.
CONTRAST:  100 mL Isovue 370 intravenous

[Series 5: thins · axial · 0.55mm/px · z∈[-43,+204]mm · 16 of 271 slices shown]
[im 12/271  lung]
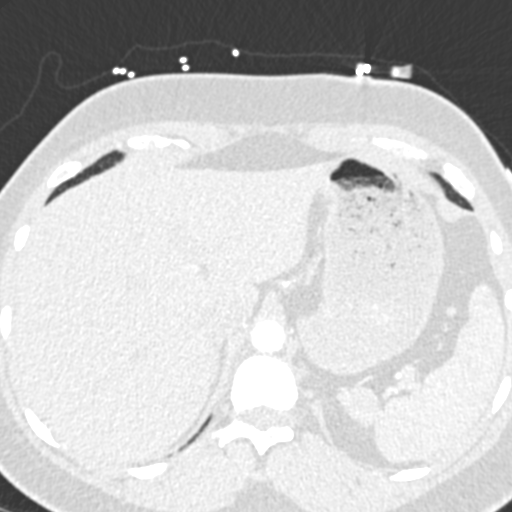
[im 36/271  soft-tissue]
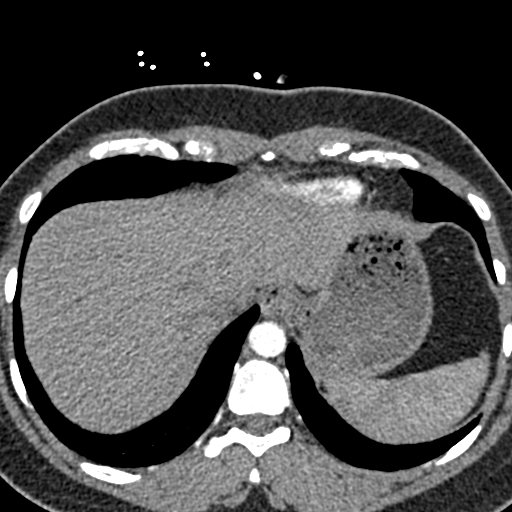
[im 47/271  lung]
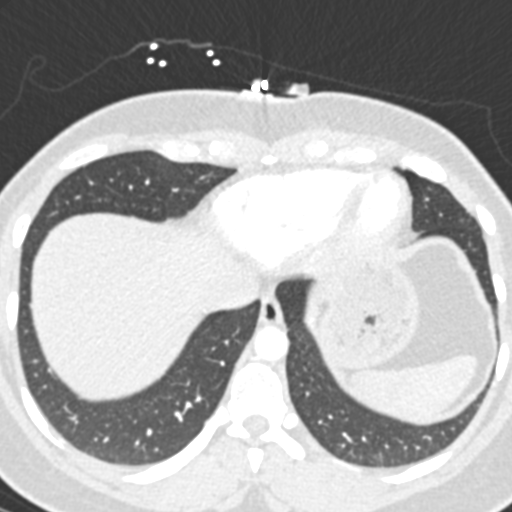
[im 59/271  soft-tissue]
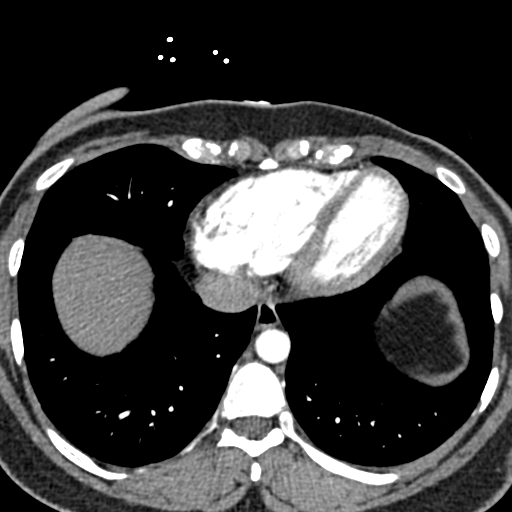
[im 83/271  lung]
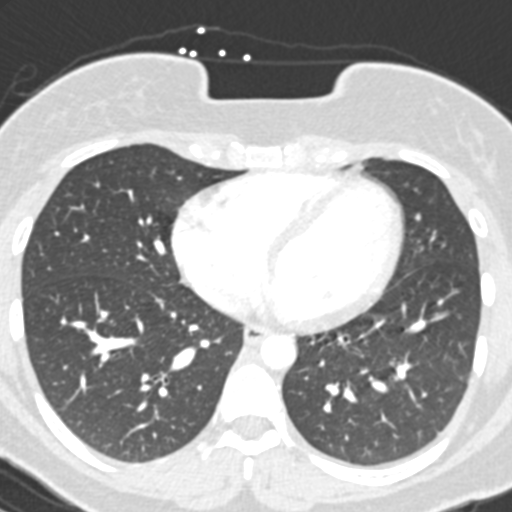
[im 94/271  soft-tissue]
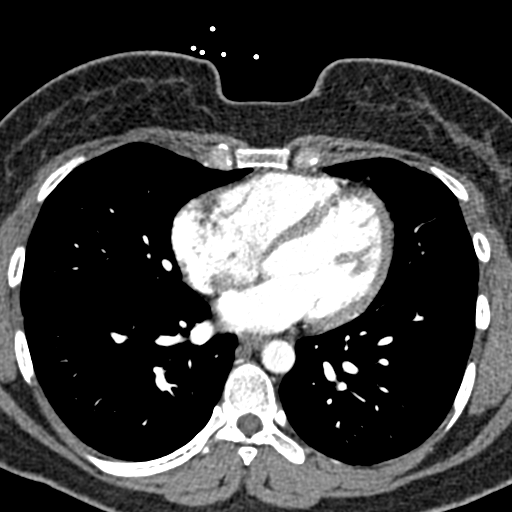
[im 106/271  lung]
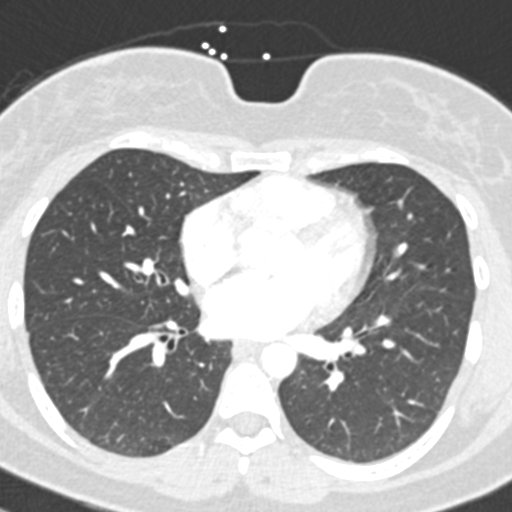
[im 130/271  soft-tissue]
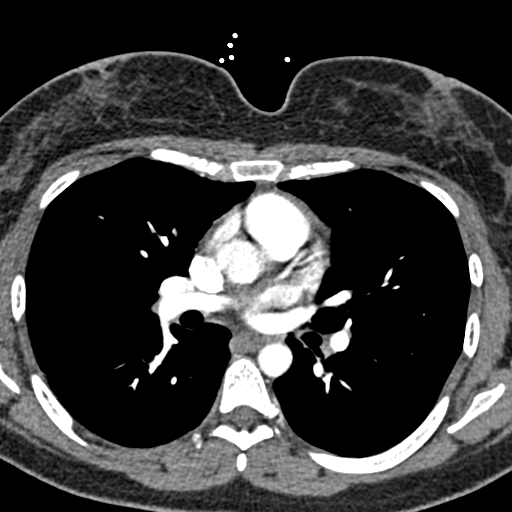
[im 141/271  lung]
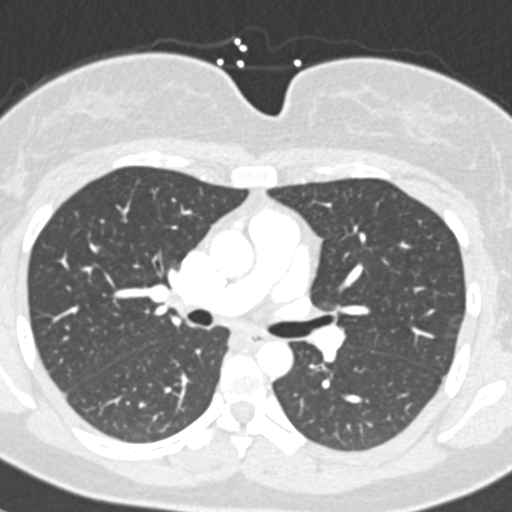
[im 165/271  soft-tissue]
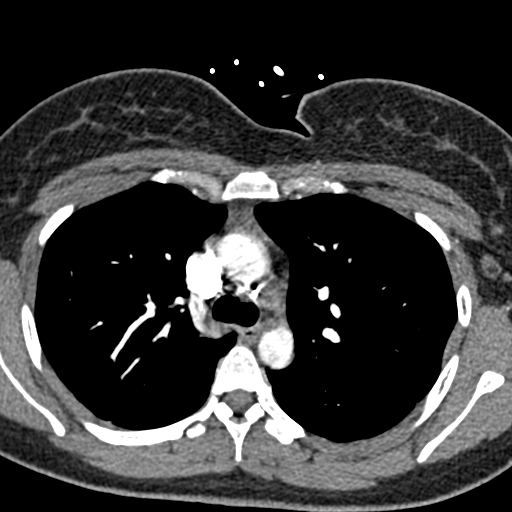
[im 177/271  lung]
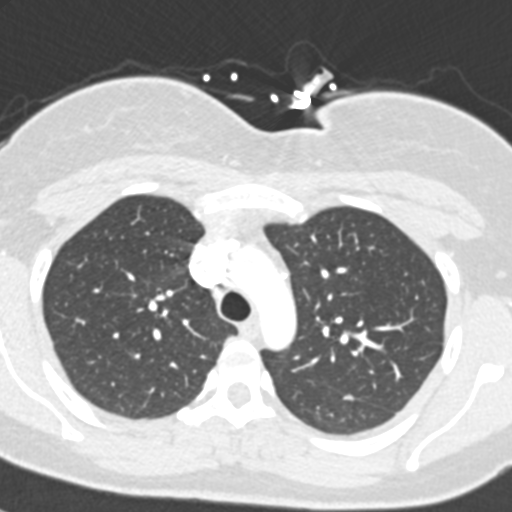
[im 188/271  soft-tissue]
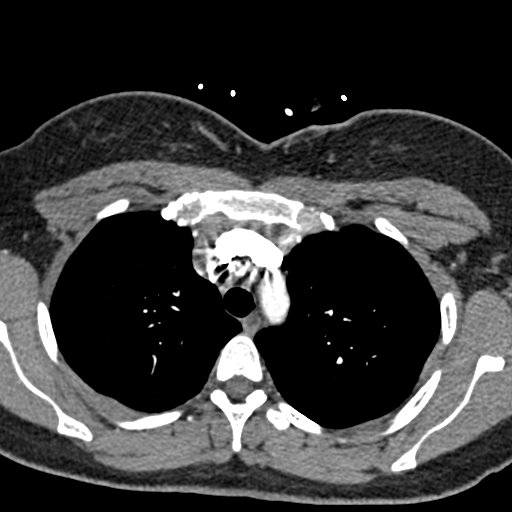
[im 212/271  lung]
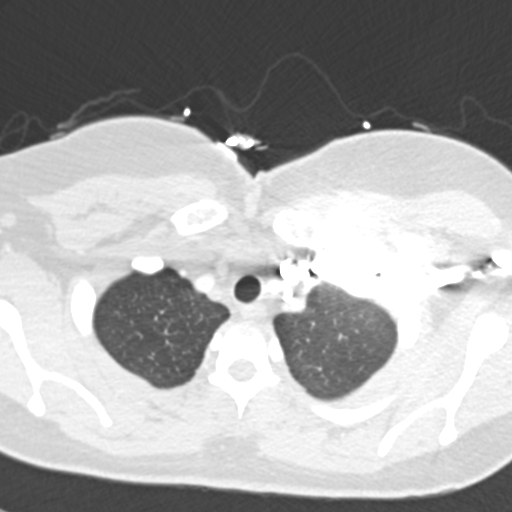
[im 224/271  soft-tissue]
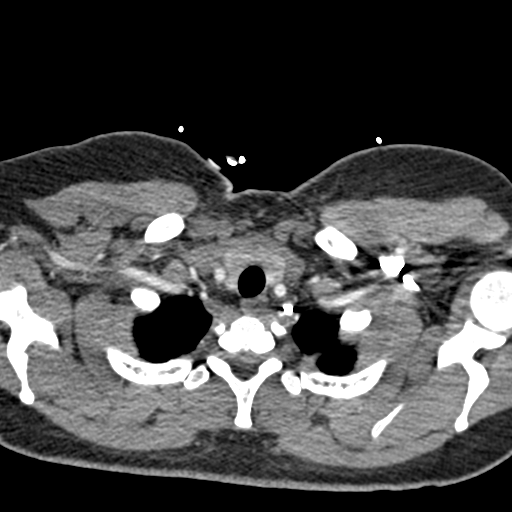
[im 235/271  lung]
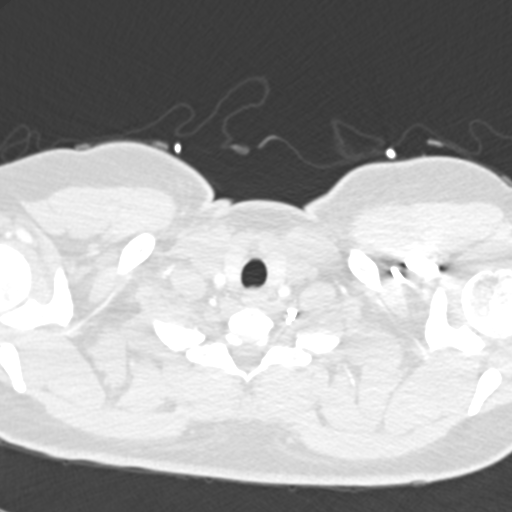
[im 259/271  soft-tissue]
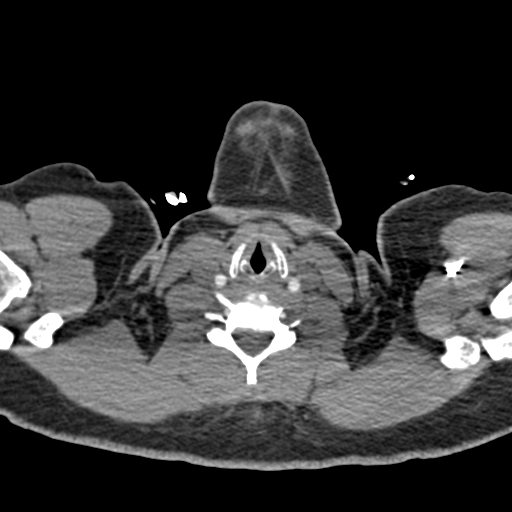

[Series 7: coronal mpr · coronal · 0.64mm/px · 3 of 151 slices shown]
[im 38/151  soft-tissue]
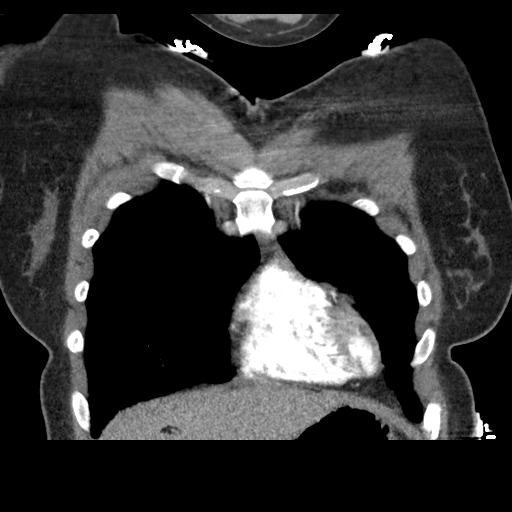
[im 76/151  soft-tissue]
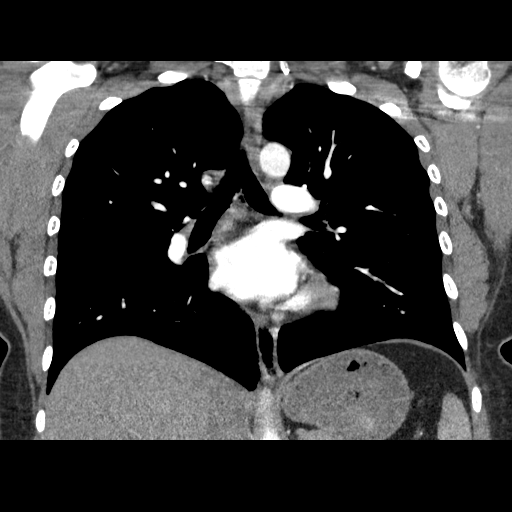
[im 113/151  soft-tissue]
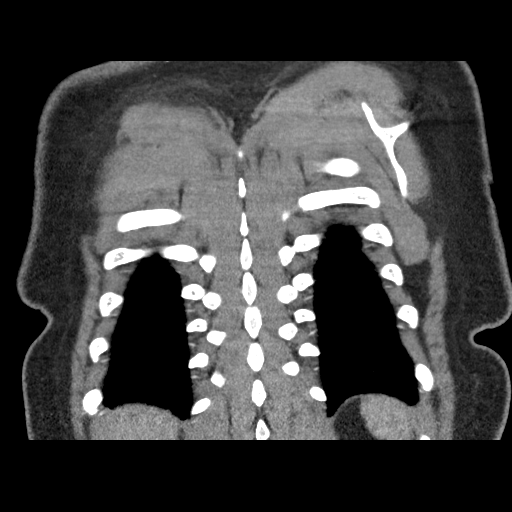

[19 of 46 positions shown; findings below may reference images not displayed]

FINDINGS: Cardiovascular: Satisfactory opacification of the pulmonary arteries
to the segmental level. No evidence of pulmonary embolism. Normal
heart size. No pericardial effusion.

Mediastinum/Nodes: No enlarged mediastinal, hilar, or axillary lymph
nodes. Thyroid gland, trachea, and esophagus demonstrate no
significant findings.

Lungs/Pleura: Lungs are clear. No pleural effusion or pneumothorax.

Upper Abdomen: No acute abnormality.

Musculoskeletal: No chest wall abnormality. No acute or significant
osseous findings.

Review of the MIP images confirms the above findings.
IMPRESSION: Negative for pulmonary embolism.

No significant abnormality.

## 2018-06-07 ENCOUNTER — Encounter: Payer: Self-pay | Admitting: Obstetrics and Gynecology

## 2018-06-21 ENCOUNTER — Encounter (INDEPENDENT_AMBULATORY_CARE_PROVIDER_SITE_OTHER): Payer: Self-pay

## 2018-06-21 ENCOUNTER — Ambulatory Visit (INDEPENDENT_AMBULATORY_CARE_PROVIDER_SITE_OTHER): Payer: Self-pay | Admitting: Obstetrics and Gynecology

## 2018-06-21 ENCOUNTER — Encounter: Payer: Self-pay | Admitting: Obstetrics and Gynecology

## 2018-06-21 VITALS — BP 103/63 | HR 66 | Ht 62.0 in | Wt 160.8 lb

## 2018-06-21 DIAGNOSIS — N920 Excessive and frequent menstruation with regular cycle: Secondary | ICD-10-CM

## 2018-06-21 LAB — POCT HEMOGLOBIN: Hemoglobin: 12.5 g/dL (ref 11–14.6)

## 2018-06-21 MED ORDER — NORGESTIMATE-ETH ESTRADIOL 0.25-35 MG-MCG PO TABS: 1.0000 | ORAL_TABLET | Freq: Every day | ORAL | 11 refills | Status: DC

## 2018-06-21 NOTE — Progress Notes (Signed)
Patient ID: Kristine Adams, female   DOB: 1986-02-13, 32 y.o.   MRN: [id number]    [facility]  @DATE @            Patient name: Kristine SalkClarivel Savino MRN [id number]  Date of birth: 1986-02-13  CC & HPI:  Kristine SalkClarivel Kama is a 32 y.o. female presenting today for heavy periods. HgB is 12.5. She was using diapers instead of pads. She has had tubal. She took oral contraceptive, injection and IUD before trying to get pregnant, and all worked very well for her.   She was recommended to restart contraceptive pills to help with her periods but they didn't help stop the bleeding when on a triphasic pill.. She is also taking iron vitamins. She has stopped taking the pills prescribed to her and has had a semmingly normal period this month.  U/S showed: uterus is normal.  ROS:  ROS +menorrhagia -fever -chills All systems are negative except as noted in the HPI and PMH.   Pertinent History Reviewed:   Reviewed:  Medical         Past Medical History:  Diagnosis Date  . No pertinent past medical history                               Surgical Hx:    Past Surgical History:  Procedure Laterality Date  . CESAREAN SECTION    . CESAREAN SECTION WITH BILATERAL TUBAL LIGATION  07/11/2012   Procedure: CESAREAN SECTION WITH BILATERAL TUBAL LIGATION;  Surgeon: Tilda BurrowJohn V Anahid Eskelson, MD;  Location: [facility];  Service: Obstetrics;  Laterality: Bilateral;  speaks spanish, interpreter needed   Medications: Reviewed & Updated - see associated section                       Current Outpatient Medications:  .  ferrous sulfate 325 (65 FE) MG tablet, Take 325 mg by mouth daily with breakfast., Disp: , Rfl:  .  ibuprofen (ADVIL,MOTRIN) 200 MG tablet, Take 200 mg by mouth every 6 (six) hours as needed for mild pain or moderate pain., Disp: , Rfl:  .  Multiple Vitamin (MULTIVITAMIN WITH MINERALS) TABS tablet, Take 1 tablet by mouth daily., Disp: , Rfl:  .  Omega-3 Fatty Acids (FISH OIL)  1000 MG CAPS, Take 1 capsule by mouth daily., Disp: , Rfl:    Social History: Reviewed -  reports that she has never smoked. She has never used smokeless tobacco.  Objective Findings:  Vitals: Blood pressure 103/63, pulse 66, height 5\' 2"  (1.575 m), weight 160 lb 12.8 oz (72.9 kg), last menstrual period 06/15/2018, not currently breastfeeding.  PHYSICAL EXAMINATION General appearance - alert, well appearing, and in no distress and oriented to person, place, and time Mental status - alert, oriented to person, place, and time, normal mood, behavior, speech, dress, motor activity, and thought processes, affect appropriate to mood PELVIC NOT DONE  Assessment & Plan:   A:  1.  Menorrhagia 2 normal uterus P:  1. Rx Sprintec 2. F/u 2-3 months for assessment. May cancel 48 hr in advance if satisfied with tx.   By signing my name below, I, Arnette NorrisMari Johnson, attest that this documentation has been prepared under the direction and in the presence of Tilda BurrowFerguson, Nikkia Devoss V, MD. Electronically Signed: Arnette NorrisMari Johnson Medical Scribe. 06/21/18. 3:06 PM.  I personally performed the services described in this documentation, which was SCRIBED in my presence.  The recorded information has been reviewed and considered accurate. It has been edited as necessary during review. Jonnie Kind, MD

## 2018-09-21 ENCOUNTER — Encounter: Payer: Self-pay | Admitting: Obstetrics and Gynecology

## 2018-09-21 ENCOUNTER — Ambulatory Visit (INDEPENDENT_AMBULATORY_CARE_PROVIDER_SITE_OTHER): Payer: Self-pay | Admitting: Obstetrics and Gynecology

## 2018-09-21 ENCOUNTER — Other Ambulatory Visit: Payer: Self-pay

## 2018-09-21 DIAGNOSIS — N920 Excessive and frequent menstruation with regular cycle: Secondary | ICD-10-CM

## 2018-09-21 MED ORDER — NORETHIN ACE-ETH ESTRAD-FE 1-20 MG-MCG(24) PO TABS: 1.0000 | ORAL_TABLET | Freq: Every day | ORAL | 11 refills | Status: DC

## 2018-09-21 NOTE — Progress Notes (Addendum)
Patient ID: Kristine Adams, female   DOB: 12-08-1985, 33 y.o.   MRN: [id number]    TELEHEALTH VIRTUAL GYNECOLOGY VISIT ENCOUNTER NOTE  I was  connected with Kristine Adams on 09/21/2018 at  2:30 PM EDT by telephone at home and it was verified that I am speaking with the correct person using two identifiers.  Translator service was used through this visit adding to its complexity  I discussed the limitations, risks, security and privacy concerns of performing an evaluation and management service by telephone and the availability of in person appointments. I also discussed with the patient that there may be a patient responsible charge related to this service. The patient expressed understanding and agreed to proceed.   History:  Kristine Adams is a 33 y.o. [id number] female being evaluated today for f/u assessment of menorrhagia and Sprintec.   The use of an interpreter  was needed for today's visit. She has 3 or 4 days heavy days then has light. Periods are better and lighter, but has issues with her breasts hurting. In march her breast were hurting. She felt lumps whenever she rubbed or massaged them. Went to clinic and went to have tests done for breast cancer and call results came back normal. Doctors st the clinic told her her breast sensitivity due to the Sprintec        Past Medical History:  Diagnosis Date  . No pertinent past medical history    Past Surgical History:  Procedure Laterality Date  . CESAREAN SECTION    . CESAREAN SECTION WITH BILATERAL TUBAL LIGATION  07/11/2012   Procedure: CESAREAN SECTION WITH BILATERAL TUBAL LIGATION;  Surgeon: Tilda Burrow, MD;  Location: [facility];  Service: Obstetrics;  Laterality: Bilateral;  speaks spanish, interpreter needed   The following portions of the patient's history were reviewed and updated as appropriate: allergies, current medications, past family history, past medical history, past social history, past  surgical history and problem list.   Review of Systems:  Pertinent items noted in HPI and remainder of comprehensive ROS otherwise negative.  Physical Exam:  Physical exam deferred due to nature of the encounter  Labs and Imaging No results found for this or any previous visit (from the past 336 hour(s)). No results found.    Assessment and Plan:     A:  1. Menorrhagia responding to OCPs Patient anxiety over breast tenderness resolved, patient reassured      P: 1. Rx Lo Loestrin 1/20 2. F/u 1 year  I discussed the assessment and treatment plan with the patient. The patient was provided an opportunity to ask questions and all were answered. The patient agreed with the plan and demonstrated an understanding of the instructions.   The patient was advised to call back or seek an in-person evaluation/go to the ED if the symptoms worsen or if the condition fails to improve as anticipated.  I provided 15 minutes of non-face-to-face time during this encounter.,  Plus chart review and subsequent documentation total time over 25 minutes   By signing my name below, I, Arnette Norris, attest that this documentation has been prepared under the direction and in the presence of Tilda Burrow, MD. Electronically Signed: Arnette Norris Medical Scribe. 09/21/18. 2:39 PM.  I personally performed the services described in this documentation, which was SCRIBED in my presence. The recorded information has been reviewed and considered accurate. It has been edited as necessary during review. Tilda Burrow, MD

## 2019-01-24 ENCOUNTER — Ambulatory Visit (HOSPITAL_COMMUNITY)
Admission: RE | Admit: 2019-01-24 | Discharge: 2019-01-24 | Disposition: A | Discharge status: Home / Self Care | Payer: Self-pay | Source: Ambulatory Visit | Attending: Obstetrics and Gynecology | Admitting: Obstetrics and Gynecology

## 2019-01-24 ENCOUNTER — Other Ambulatory Visit: Payer: Self-pay

## 2019-01-24 ENCOUNTER — Encounter (HOSPITAL_COMMUNITY): Payer: Self-pay

## 2019-01-24 DIAGNOSIS — Z1239 Encounter for other screening for malignant neoplasm of breast: Secondary | ICD-10-CM

## 2019-01-24 DIAGNOSIS — N6320 Unspecified lump in the left breast, unspecified quadrant: Secondary | ICD-10-CM

## 2019-01-24 NOTE — Patient Instructions (Signed)
Explained breast self awareness with Kristine Adams. Patient did not need a Pap smear today due to last Pap smear was in 2019 per patient. Let her know [facility] will cover Pap smears every 3 years unless has a history of abnormal Pap smears. Referred patient to [facility] for a left breast abscess drainage per recommendation. Appointment scheduled for Tuesday, January 24, 2019 at 1130. Patient aware of appointment and will be there. Kristine Adams verbalized understanding.  Kerstyn Coryell, Arvil Chaco, RN 9:24 AM

## 2019-01-24 NOTE — Progress Notes (Signed)
Complaints of a left breast lump since February 2020 that has increased in size. Patient complained of a milky pus like discharge when she first noticed that resolved after she stopped expressing the discharge. Patient stated a sample of the discharge was sent to the lab in February when she went to the [facility] that was benign. Patient complained of left breast pain since February that comes and goes. Patient rates the pain at a 5-10. Patient stated she has some redness/purple colored at breast lump that has improved. Patient stated she is currently taking an antibiotic. Patient had a diagnostic mammogram and left breat ultrasound at [facility] 01/18/2019 that drainage of the left breast abscess is recommended for follow-up. [facility] referred patient to [employer] [facility].  Pap Smear: Pap smear not completed today. Last Pap smear was 2019 at the [facility] Department and normal per patient. Per patient has no history of an abnormal Pap smear. No Pap smear results are in Epic.  Physical exam: Breasts Breasts symmetrical. No skin abnormalities right breast. A reddened area observed on the left breast next to the nipple between 12 o'clock and 2 o'clock. No nipple retraction bilateral breasts. No nipple discharge bilateral breasts. No lymphadenopathy. No lumps palpated right breast. Palpated a mass within the left breast between 12 o'clock and 6 o'clock at the center of the breast with at larger lump palpated between 12 o'clock and 2 o'clock next to nipple. Complaints of pain when palpated left breast lump. Referred patient to [facility] for a left breast abscess drainage per recommendation. Appointment scheduled for Tuesday, January 24, 2019 at 1130.        Pelvic/Bimanual No Pap smear completed today since last Pap smear was in 2019 per patient. Pap smear not indicated per BCCCP guidelines.   Smoking History: Patient has never smoked.  Patient  Navigation: Patient education provided. Access to services provided for patient through [facility] program. Spanish interpreter provided.   Breast and Cervical Cancer Risk Assessment: Patient has no family history of breast cancer, known genetic mutations, or radiation treatment to the chest before age 74. Patient has no history of cervical dysplasia, immunocompromised, or DES exposure in-utero. Breast cancer risk assessment completed. No breast cancer risk calculated due to patient is less than 57 years old.  Used Spanish interpreter [employer] from [place].

## 2019-01-26 ENCOUNTER — Encounter (HOSPITAL_COMMUNITY): Payer: Self-pay | Admitting: *Deleted

## 2019-01-30 ENCOUNTER — Encounter (HOSPITAL_COMMUNITY): Payer: Self-pay | Admitting: *Deleted

## 2019-02-06 ENCOUNTER — Other Ambulatory Visit: Payer: Self-pay | Admitting: Radiology

## 2019-12-26 ENCOUNTER — Ambulatory Visit: Payer: Self-pay | Admitting: *Deleted

## 2019-12-26 ENCOUNTER — Other Ambulatory Visit: Payer: Self-pay

## 2019-12-26 VITALS — BP 106/74 | Temp 98.6°F | Wt 179.8 lb

## 2019-12-26 DIAGNOSIS — Z1239 Encounter for other screening for malignant neoplasm of breast: Secondary | ICD-10-CM

## 2019-12-26 DIAGNOSIS — N6310 Unspecified lump in the right breast, unspecified quadrant: Secondary | ICD-10-CM

## 2019-12-26 NOTE — Patient Instructions (Addendum)
Informed Kristine Adams about breast self awareness. Patient did not need a Pap smear today due to last Pap smear was in 2019 per patient. Let her know [facility] will cover Pap smears every 3 years unless has a history of abnormal Pap smears. Referred patient to [facility] for a right breast diagnostic mammogram and ultrasound. Appointment scheduled December 27, 2019 at 11:15am. Patient aware of appointment and will be there. Arleigh Yebra verbalized understanding.  [name], RN, FNP student 3:43 PM

## 2019-12-26 NOTE — Progress Notes (Signed)
[name] is a 34 y.o. female who presents to [facility] clinic today with complaints of right breast pain and redness.    Pap Smear: Pap smear not completed today. Last Pap smear was in 2019 at [facility]) clinic and was normal. Per patient has no history of an abnormal Pap smear. Last Pap smear result is not available in Epic.   Physical exam: Breasts Breasts symmetrical. Skin on right breast red between 6 o'clock and 10 o'clock just to the right of the nipple. No nipple retraction bilateral breasts. No nipple discharge bilateral breasts. No lymphadenopathy. Lump palpated at the site of the redness in the right breast. Patient reports pain at a 10/10 in severity in this area of her breast and states that she has a history of a multiple left breast abscesses that have required incision and drainage.       Pelvic/Bimanual Pap is not indicated today.    Smoking History: Patient has never smoked.   Patient Navigation: Patient education provided. Access to services provided for patient through [facility] program. [place], [place] interpreter (interpreter number [phone]) provided through [employer].   Breast and Cervical Cancer Risk Assessment: Patient does not have family history of breast cancer, known genetic mutations, or radiation treatment to the chest before age 40. Patient does not have history of cervical dysplasia, immunocompromised, or DES exposure in-utero.  Risk Assessment    Risk Scores      12/26/2019 01/24/2019   Last edited by: Narda Rutherford, LPN [name], LPN   5-year risk: 0.1 %    Lifetime risk: 5.3 %           A: BCCCP exam without pap smear Complaints of right breast pain and redness.  P: Referred patient to [facility] for a right breast diagnostic mammogram and ultrasound. Appointment scheduled December 27, 2019 at 11:15am.  [name], RN, FNP student 12/26/2019 3:31 PM   Attestation of Supervision of  Student:  I confirm that I have verified the information documented in the nurse practitioner student's note and that I have also personally reperformed the history, physical exam and all medical decision making activities.  I have verified that all services and findings are accurately documented in this student's note; and I agree with management and plan as outlined in the documentation. I have also made any necessary editorial changes.  Complaints of pain when palpated reddened lump within the right breast.   Previous Exam 01/24/2019: Breasts symmetrical. No skin abnormalities right breast. A reddened area observed on the left breast next to the nipple between 12 o'clock and 2 o'clock. No nipple retraction bilateral breasts. No nipple discharge bilateral breasts. No lymphadenopathy. No lumps palpated right breast. Palpated a mass within the left breast between 12 o'clock and 6 o'clock at the center of the breast with at larger lump palpated between 12 o'clock and 2 o'clock next to nipple. Complaints of pain when palpated left breast lump.  [name], [name], RN [facility] [employer], [facility] 12/26/2019 4:49 PM

## 2019-12-27 ENCOUNTER — Other Ambulatory Visit: Payer: Self-pay | Admitting: Radiology

## 2020-01-03 ENCOUNTER — Telehealth: Payer: Self-pay | Admitting: *Deleted

## 2020-01-03 NOTE — Telephone Encounter (Signed)
Received message that patient called around [phone] this morning stating she has new right breast discharge.   Patient was seen in [facility]CP 12/26/2019 and a right breast biopsy completed 12/27/2019 at [facility] that showed healing abcess. Aspiration was attempted. [facility] contacted patients PCP Dr. Elenora Fender and antibiotic sent to patients Pharmacy to treat abscess. Called patient back with Spanish interpreter Nira Conn and she stated she has a new pus like discharge draining from a new blister on right breast x three days. Patient states the area is red, warm, and painful. Patient stated area is at 9 o'clock and previous area biopsied was at 6 o'clock. Patient did start antibiotics on 12/29/19. She stated she was given Amoxicillin 875mg  bid. Told patient will contact our Medical Director and call her back. Patient verbalized understanding.  Called patient back with Spanish interpreter . Let patient know Dr. Nira Conn recommended to continue her antibiotics and if doesn't see improvement within a few days to call Jolayne Panther back and will schedule to see her in [facility] next week. Patient verbalized understanding.

## 2020-03-26 ENCOUNTER — Other Ambulatory Visit: Payer: Self-pay

## 2020-03-26 ENCOUNTER — Ambulatory Visit: Payer: Self-pay | Admitting: *Deleted

## 2020-03-26 VITALS — BP 114/74 | Temp 97.1°F | Wt 173.6 lb

## 2020-03-26 DIAGNOSIS — Z1239 Encounter for other screening for malignant neoplasm of breast: Secondary | ICD-10-CM

## 2020-03-26 DIAGNOSIS — N644 Mastodynia: Secondary | ICD-10-CM

## 2020-03-26 DIAGNOSIS — N611 Abscess of the breast and nipple: Secondary | ICD-10-CM

## 2020-03-26 NOTE — Progress Notes (Signed)
[name] is a 34 y.o. female who presents to [facility] clinic today with complaint of right breast pain and skin color changes. Patient was on antibiotics for a right breast abscess in July 2021 that patient stated improved but returned two days after completing treatment. Patient stated there is a firm area on her right breast and the skin is a purplish color. Patient complained of bilateral breast pain that is greater within the right breast. Patient states the pain comes and goes. Patient rates the pain at a 8-9 out of 10..    Pap Smear: Pap smear not completed today. Last Pap smear was in July 2021at [facility] clinicand was normal per patient.Per patient has no history of an abnormal Pap smear. Last Pap smear resultis not available in Epic.   Physical exam: Breasts Breasts symmetrical. Skin on right breast red between 8 o'clock and 10 o'clock just to the right of the nipple. No nipple retraction bilateral breasts. No nipple discharge bilateral breasts. No lymphadenopathy. firm area palpated at the site of the redness in the right breast. Patient complained of pain when palpated reddened area within the right breast. Patient complained of bilateral lower breast pain on exam. Patient has a history of a multiple left breast abscesses that have required incision and drainage.   Pelvic/Bimanual Pap is not indicated today per BCCCP guidelines.   Smoking History: Patient has never smoked.  Patient Navigation: Patient education provided. Access to services provided for patient through [facility] program. Spanish interpreter Natale Lay from [facility] provided.   Breast and Cervical Cancer Risk Assessment: Patient does not have family history of breast cancer, known genetic mutations, or radiation treatment to the chest before age 71. Patient does not have history of cervical dysplasia, immunocompromised, or DES exposure in-utero.  Risk Assessment     Risk Scores      03/26/2020 12/26/2019   Last edited by: Narda Rutherford, LPN McGill, Sherie Demetrius Charity, LPN   5-year risk: 0.1 % 0.1 %   Lifetime risk: 5.3 % 5.3 %          A: BCCCP exam without pap smear Complaint of bilateral breast pain, right breast firmness and skin changes.  P: Referred patient to [facility] for a right breast diagnosticmammogram and ultrasound. Appointment scheduled Tuesday, March 26, 2020 at 11:45am.  Priscille Heidelberg, RN  03/26/2020 10:24 AM

## 2020-03-26 NOTE — Patient Instructions (Addendum)
Explained breast self awareness with Azyria Aronov. Patient did not need a Pap smear today due to last Pap smear was in July 2021 per patient. Let her know [facility] will cover Pap smears every 3 years unless has a history of abnormal Pap smears. Referred patient to [facility] for a right breast diagnosticmammogram and ultrasound. Appointment scheduled Tuesday, March 26, 2020 at 11:45am. Patient aware of appointment and will be there. Chennel Lui verbalized understanding.  Kaiya Boatman, [name], RN 10:24 AM

## 2020-03-27 ENCOUNTER — Telehealth: Payer: Self-pay | Admitting: *Deleted

## 2020-03-27 ENCOUNTER — Other Ambulatory Visit: Payer: Self-pay | Admitting: Obstetrics and Gynecology

## 2020-03-27 MED ORDER — NITROFURANTOIN MONOHYD MACRO 100 MG PO CAPS: 100.0000 mg | ORAL_CAPSULE | Freq: Two times a day (BID) | ORAL | 0 refills | Status: DC

## 2020-03-27 NOTE — Telephone Encounter (Signed)
Patient had a right brreast ultrasound completed yesterday and antibiotic treatment is recommended to treat her mastitis. Spoke with Dr. Jolayne Panther and orders for [facility] sent to patients pharmacy.  Called patient with Spanish interpreter Nira Conn and let her know that a prescription for Macrobid has been sent to her pharmacy. Let patient know that she will take 1 capsule bid for 7 days. Explained the importance of taking all of her prescription and to call if symptoms do not resolve or worsen. Patient verbalized understanding.

## 2020-03-28 ENCOUNTER — Other Ambulatory Visit: Payer: Self-pay | Admitting: Obstetrics and Gynecology

## 2020-03-28 MED ORDER — NITROFURANTOIN MONOHYD MACRO 100 MG PO CAPS: 100.0000 mg | ORAL_CAPSULE | Freq: Two times a day (BID) | ORAL | 0 refills | Status: AC

## 2020-03-28 MED ORDER — NITROFURANTOIN MONOHYD MACRO 100 MG PO CAPS: 100.0000 mg | ORAL_CAPSULE | Freq: Two times a day (BID) | ORAL | 0 refills | Status: DC

## 2020-04-11 ENCOUNTER — Telehealth: Payer: Self-pay | Admitting: *Deleted

## 2020-04-11 NOTE — Telephone Encounter (Signed)
Patient showed up at [facility] clinic today with a bill from [facility]. Spoke with patient with Spanish interpreter Natale Lay from [facility]. Explained to patient that the bill brought today is not covered by [facility]. Gave patient the phone number to contact [facility] to see if there is any financial assistance options or to make payment arrangements. Patient verbalized understanding.

## 2020-04-21 IMAGING — US US PELVIS COMPLETE TRANSABD/TRANSVAG
1 series · 14 of 25 positions shown · non-contrast
Comparison: None

CLINICAL DATA: Metrorrhagia



[Series 1: us pelvis complete transabd/transvag · 0.24mm/px · 14 of 69 slices shown]
[im 1/69]
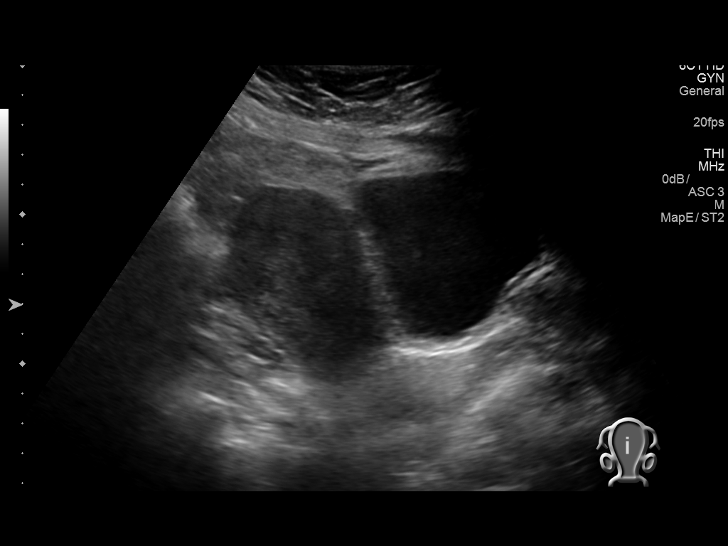
[im 6/69]
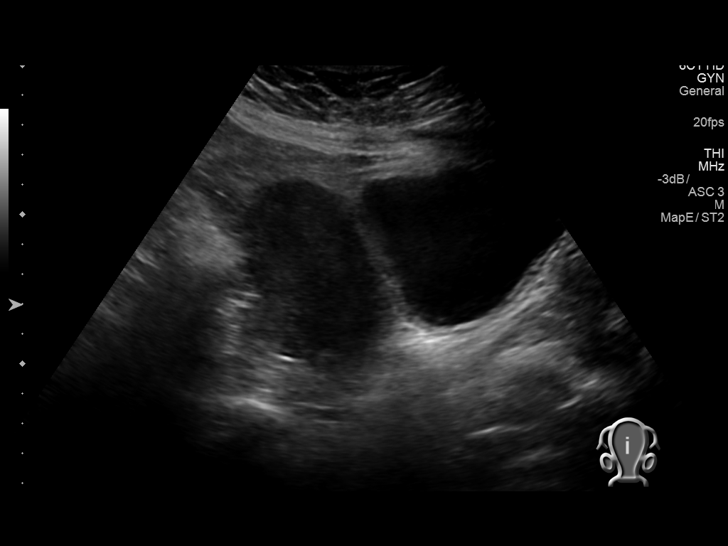
[im 12/69]
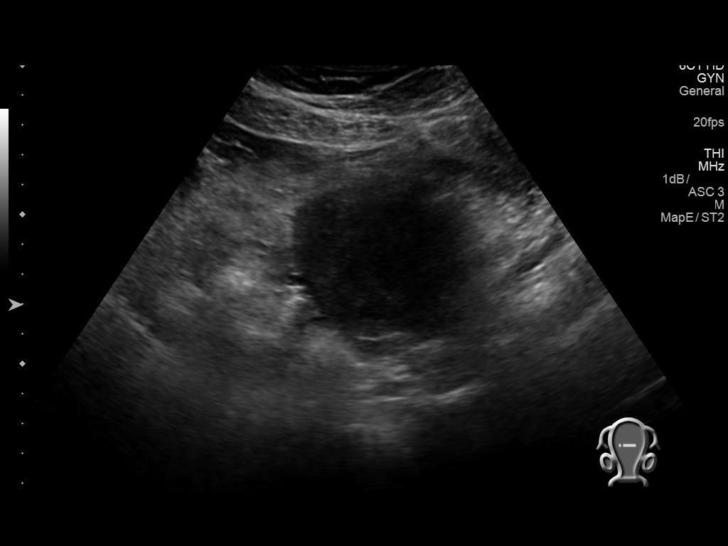
[im 18/69]
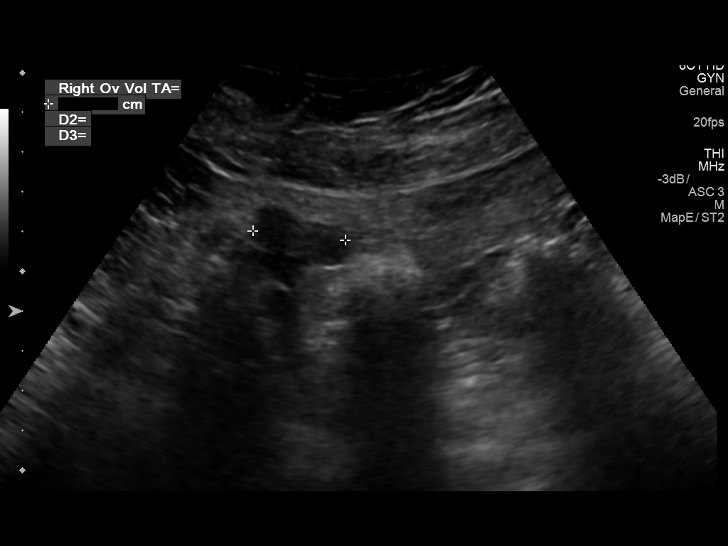
[im 23/69]
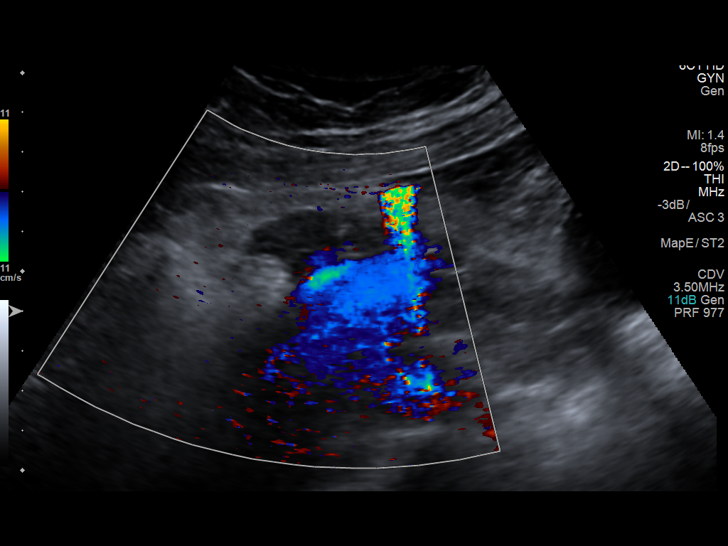
[im 26/69]
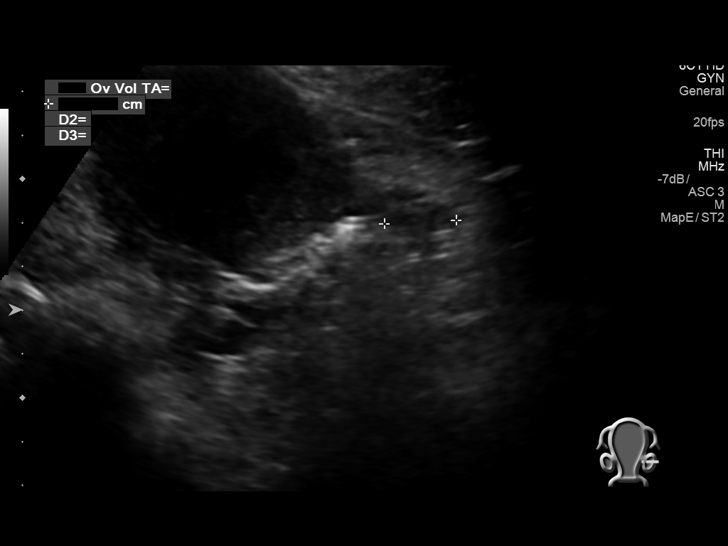
[im 32/69]
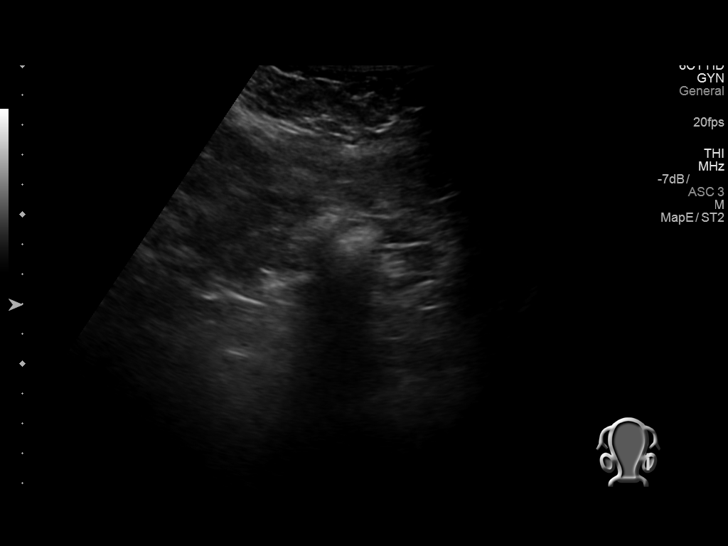
[im 37/69]
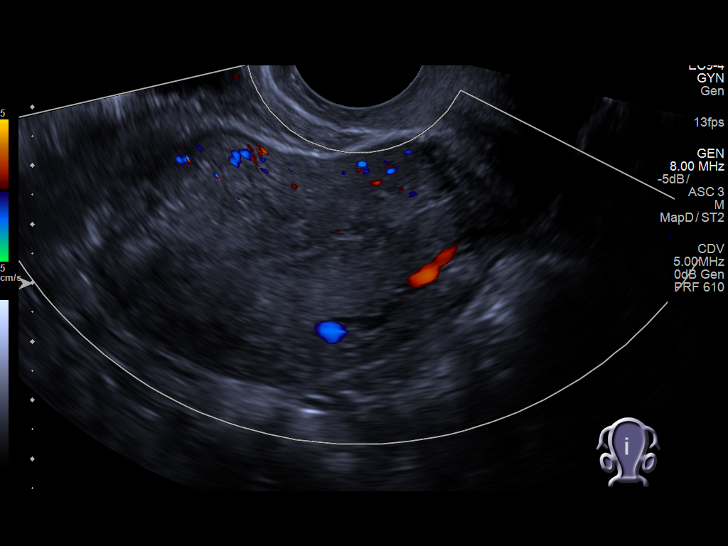
[im 43/69]
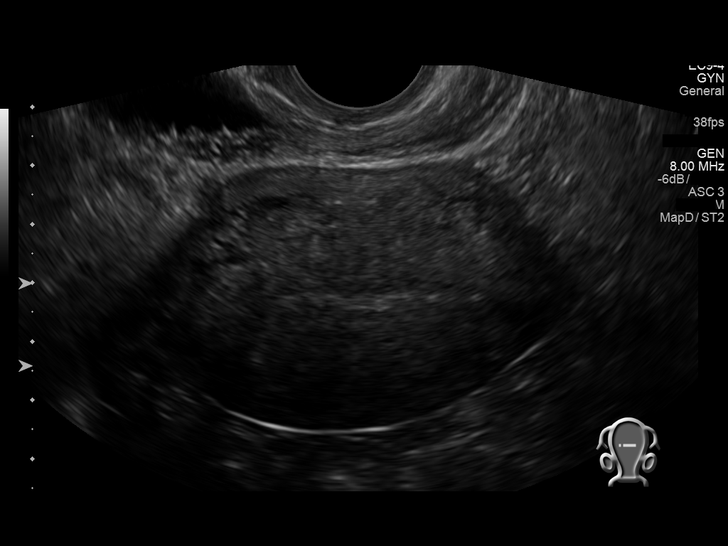
[im 46/69]
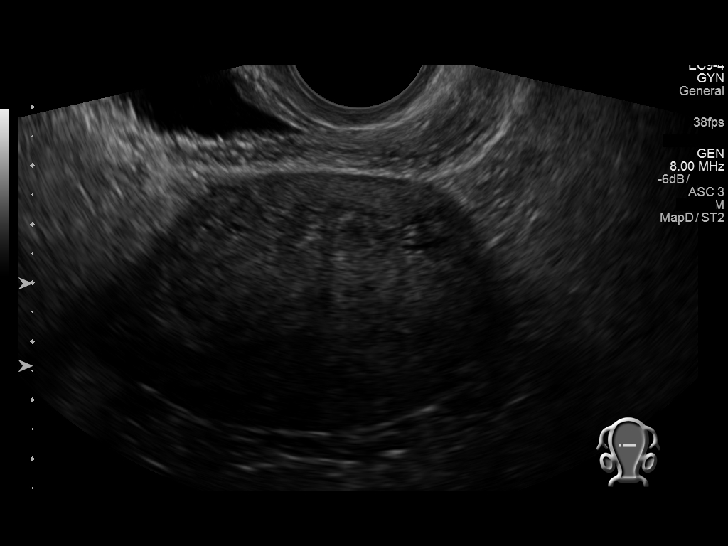
[im 52/69]
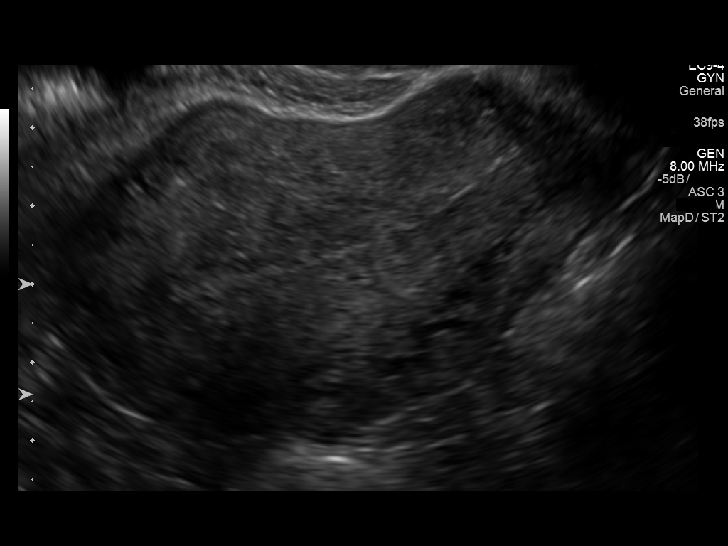
[im 57/69]
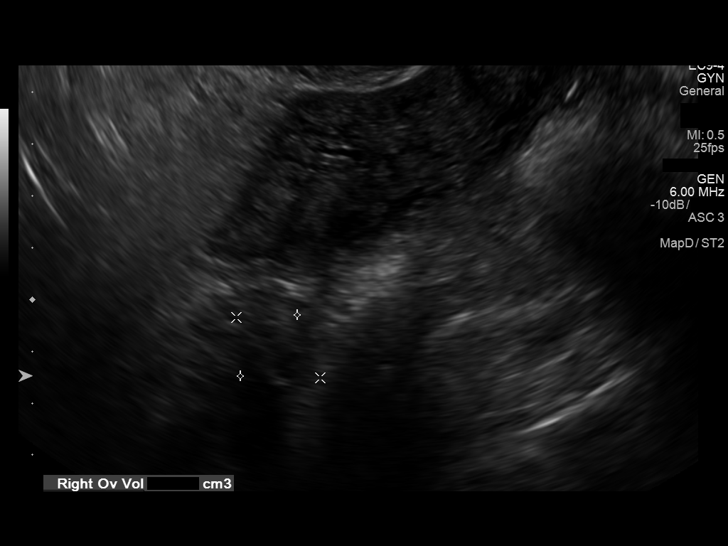
[im 63/69]
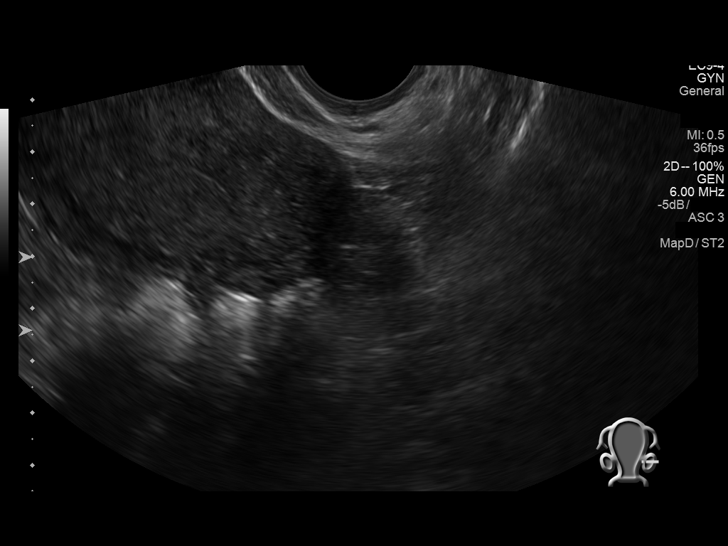
[im 69/69]
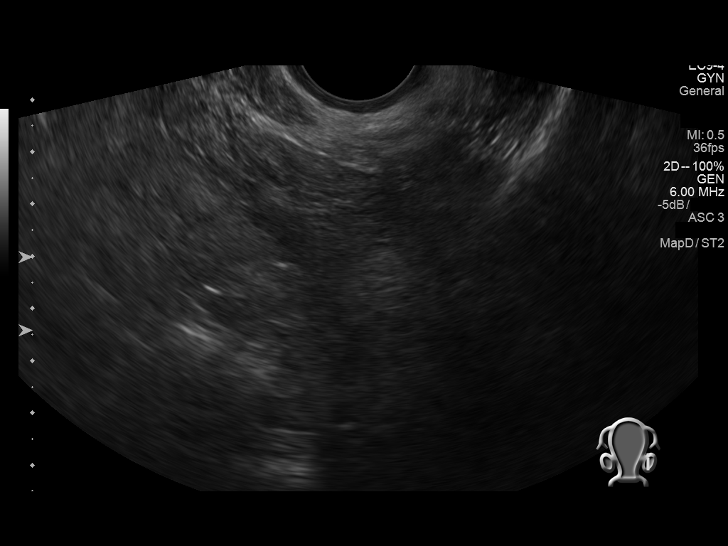

[14 of 25 positions shown; findings below may reference images not displayed]

FINDINGS: Uterus

Measurements: 10.3 x 4.8 x 6.6 cm.  Normal morphology without mass

Endometrium

Thickness: 2 mm thick, normal. No endometrial fluid or focal
abnormality

Right ovary

Measurements: 2.3 x 2.9 x 1.4 cm, volume 4.1 mL. Normal morphology
without mass

Left ovary

Measurements: 1.6 x 1.9 x 0.9 cm, volume 2.5 mL. Normal morphology
without mass

Other findings

No free pelvic fluid or adnexal masses.
IMPRESSION: Normal exam.

## 2020-05-20 ENCOUNTER — Telehealth: Payer: Self-pay

## 2020-05-20 ENCOUNTER — Other Ambulatory Visit: Payer: Self-pay

## 2020-05-20 MED ORDER — SULFAMETHOXAZOLE-TRIMETHOPRIM 800-160 MG PO TABS: 1.0000 | ORAL_TABLET | Freq: Two times a day (BID) | ORAL | 0 refills | Status: AC

## 2020-05-20 MED ORDER — FLUCONAZOLE 150 MG PO TABS: 150.0000 mg | ORAL_TABLET | Freq: Once | ORAL | 0 refills | Status: AC

## 2020-05-20 NOTE — Telephone Encounter (Addendum)
Patient informed Bactrim DS, Diflucan sent to pharmacy per Dr. Macon Large, referral sent to [facility].  ----- Message from Tereso Newcomer, MD sent at 05/20/2020  1:17 PM EST ----- Regarding: RE: BCCCP Patient Yes ----- Message ----- From: Narda Rutherford, LPN Sent: [id number]  12:25 PM EST To: Tereso Newcomer, MD, Priscille Heidelberg, RN Subject: RE: BCCCP Patient                              Hi,    Just want to clarify that Bactrim DS is same as Bactrim DS 800mg -160 mg tablets. Thanks, ----- Message ----- From: [employer], MD Sent: 05/16/2020   7:49 PM EST To: 05/18/2020, MD, Catalina Antigua, RN, # Subject: RE: BCCCP Patient                              She has been treated with various antibiotic regimens but nothing seems to work. I feel she needs a referral to Breast Specialists at [facility] or Breast Center at this point.  In the meantime, we can try one more antibiotic that she has not had recently; Bactrim DS po bid x 14 days. Will do extended regimen given recurrence of symptoms, this also has good MRSA coverage.  May need Diflucan if she gets rebound yeast vaginitis after antibiotics.  Happy Thanksgiving weekend.  05-30-1989, MD, FACOG Obstetrician & Gynecologist, Faculty [facility] for [facility], [facility] ----- Message ----- From: [facility], RN Sent: 05/15/2020  10:55 AM EST To: 05/17/2020, MD, Tereso Newcomer, MD, # Subject: BCCCP Patient                                  Hello,  I spoke to the Radiologist at [facility] late yesterday afternoon about the attached patient. I sent an email with the report received. The patient has had recurrent breast infections since July. Would you be willing to send a prescription for antibiotics? What is your thoughts of a referral to [facility]? I copied [facility] in case you are on vacation. I copied Tenessa Marsee since I am on PAL today. Thank  you for your help! Happy Thanksgiving.   Thanks, 05-30-1989

## 2020-05-21 ENCOUNTER — Ambulatory Visit: Payer: Self-pay | Admitting: Surgery

## 2020-05-21 DIAGNOSIS — N6310 Unspecified lump in the right breast, unspecified quadrant: Secondary | ICD-10-CM

## 2020-05-21 NOTE — H&P (Signed)
Claretta Fraise Appointment: 05/21/2020 1:45 PM Location: [facility] Patient #: [id number] DOB: 25-Jul-1985 Single / Language: Undefined / Race: Refused to Report/Unreported Female  History of Present Illness (Matilde Markie A. Joeleen Wortley MD; 05/21/2020 2:17 PM) Patient words: Patient presents to the office for evaluation of right breast pain. History of complex right breast abscesses. This is being managed medically since July of this year. She does have a remote history of left breast granulomatous mastitis that improved with steroid/year. She underwent core biopsy of the right breast mass which showed inflammation and abscess but no signs granulomatous mastitis. She had multiple bouts of aspiration in the area will not clear up despite antibiotics and multiple bouts of aspiration. She presents for evaluation of her right breast chronic abscess and mass. Her symptoms come and go. She said issues since July of this year on the right side and it is not resolved despite multiple bouts of antibiotics. She's also had multiple aspirations. It's a complex area of multiple small fluid collections involving about 3 cm of breast tissue at about 8:00 looks like. I discussed the case with the radiologist as well. She has no fever or chills. She does complain of fatigue and mood change.  The patient is a 34 year old female.   Allergies Laurette Schimke, [place]; 05/21/2020 1:47 PM) No Known Drug Allergies [02/01/2019]: Allergies Reconciled  Medication History Laurette Schimke, [place]; 05/21/2020 1:47 PM) Bactrim DS (800-160MG  Tablet, 1 (one) Oral two times daily, Taken starting 02/01/2019) Active. Ferrous Sulfate (325MG  Capsule, Oral) Active. Clindamycin HCl (150MG  Capsule, Oral) Active. Medications Reconciled     Review of Systems (Dejanee Thibeaux A. Terasa Orsini MD; 05/21/2020 2:17 PM) All other systems negative  Vitals Caldwell RMA; 05/21/2020 1:48 PM) 05/21/2020 1:47 PM Weight: 172  lb Height: 62in Body Surface Area: 1.79 m Body Mass Index: 31.46 kg/m  Temp.: 98.14F  Pulse: 94 (Regular)  P.OX: 100% (Room air) BP: 118/68(Sitting, Left Arm, Standard)        Physical Exam (Anysia Choi A. Lasheka Kempner MD; 05/21/2020 2:18 PM)  General Mental Status-Alert. General Appearance-Consistent with stated age. Hydration-Well hydrated. Voice-Normal.  Breast Note: On the lateral border of the nipple complex is an area of ecchymoses. There is no redness or fluctuance. There is full and sore. No evidence of abscess on exam. This is at about the 8:00 to 9 o'clock position. Left breast is normal.  Neurologic Neurologic evaluation reveals -alert and oriented x 3 with no impairment of recent or remote memory. Mental Status-Normal.  Lymphatic Head & Neck  General Head & Neck Lymphatics: Bilateral - Description - Normal. Axillary  General Axillary Region: Bilateral - Description - Normal. Tenderness - Non Tender.    Assessment & Plan (Lagretta Loseke A. 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 Since this does not improve with medical treatment despite multiple bouts of antibiotics and aspiration, recommended excision of this area to hopefully improve her symptoms. Chance of recurrence. Risks and benefits discussed. A translator was available to assist with today's exam as well as discussion of surgical options. I discussed the case with Dr. 05/23/2020 of radiology she will be able to bracket this area to clip she thinks to remove this and states a recurrent problem that is failed medical management. Her core biopsies were done of this which did not show evidence of granulomatous mastitis therefore  do not think steroids would be beneficial.   Risk of  lumpectomy include bleeding, infection, seroma, more surgery, use of seed/wire, wound care, cosmetic deformity and the need for other treatments, death , blood clots, death. Pt agrees to proceed.  Current Plans Pt Education - [facility] Breast Biopsy HCI: discussed with patient and provided information. Pt Education - Pamphlet Given - Breast Biopsy: discussed with patient and provided information. Pt Education - [facility] Breast Pains Education

## 2020-06-04 ENCOUNTER — Telehealth: Payer: Self-pay | Admitting: *Deleted

## 2020-06-04 NOTE — Telephone Encounter (Signed)
Patient came to clinic today stating she completed her antibiotic and that she continues to have right breast pain, redness, and swelling. Patient was seen at [facility] 05/21/2020. [facility] to discuss patients symptoms. [facility] stated that someone will be reaching out to her soon to schedule her surgery. Her provider does not have any available appointments until 06/17/2020. The nurse is going to discuss with her provider if she needs to be seen prior to his first available or if will send another a prescription for antibiotics to her pharmacy. One of the nurses will call her from [facility] today with her providers recommendation. Let patient know that someone will call her from [facility] today and if she doesn't hear anything to give them a call. Patient verbalized understanding.

## 2020-06-26 ENCOUNTER — Encounter (HOSPITAL_BASED_OUTPATIENT_CLINIC_OR_DEPARTMENT_OTHER): Payer: Self-pay | Admitting: Surgery

## 2020-07-01 ENCOUNTER — Other Ambulatory Visit (HOSPITAL_COMMUNITY): Payer: Self-pay

## 2020-07-02 ENCOUNTER — Encounter (HOSPITAL_BASED_OUTPATIENT_CLINIC_OR_DEPARTMENT_OTHER)
Admission: RE | Admit: 2020-07-02 | Discharge: 2020-07-02 | Disposition: A | Discharge status: Home / Self Care | Payer: Self-pay | Source: Ambulatory Visit | Attending: Surgery | Admitting: Surgery

## 2020-07-02 ENCOUNTER — Other Ambulatory Visit (HOSPITAL_COMMUNITY): Payer: Self-pay

## 2020-07-02 DIAGNOSIS — Z01812 Encounter for preprocedural laboratory examination: Secondary | ICD-10-CM | POA: Insufficient documentation

## 2020-07-02 DIAGNOSIS — U071 COVID-19: Secondary | ICD-10-CM | POA: Insufficient documentation

## 2020-07-02 LAB — COMPREHENSIVE METABOLIC PANEL
ALT: 29 U/L (ref 0–44)
AST: 25 U/L (ref 15–41)
Albumin: 3.7 g/dL (ref 3.5–5.0)
Alkaline Phosphatase: 81 U/L (ref 38–126)
Anion gap: 9 (ref 5–15)
BUN: 12 mg/dL (ref 6–20)
CO2: 25 mmol/L (ref 22–32)
Calcium: 9.3 mg/dL (ref 8.9–10.3)
Chloride: 103 mmol/L (ref 98–111)
Creatinine, Ser: 0.86 mg/dL (ref 0.44–1.00)
GFR, Estimated: >60 mL/min (ref >60)
Glucose, Bld: 111 mg/dL — ABNORMAL HIGH (ref 70–99)
Potassium: 3.8 mmol/L (ref 3.5–5.1)
Sodium: 137 mmol/L (ref 135–145)
Total Bilirubin: 1 mg/dL (ref 0.3–1.2)
Total Protein: 7.5 g/dL (ref 6.5–8.1)

## 2020-07-02 LAB — CBC WITH DIFFERENTIAL/PLATELET
Abs Immature Granulocytes: 0.03 10*3/uL (ref 0.00–0.07)
Basophils Absolute: 0 10*3/uL (ref 0.0–0.1)
Basophils Relative: 0 %
Eosinophils Absolute: 0 10*3/uL (ref 0.0–0.5)
Eosinophils Relative: 0 %
HCT: 36.9 % (ref 36.0–46.0)
Hemoglobin: 11.8 g/dL — ABNORMAL LOW (ref 12.0–15.0)
Immature Granulocytes: 0 %
Lymphocytes Relative: 32 %
Lymphs Abs: 3.1 10*3/uL (ref 0.7–4.0)
MCH: 23.8 pg — ABNORMAL LOW (ref 26.0–34.0)
MCHC: 32 g/dL (ref 30.0–36.0)
MCV: 74.4 fL — ABNORMAL LOW (ref 80.0–100.0)
Monocytes Absolute: 0.6 10*3/uL (ref 0.1–1.0)
Monocytes Relative: 7 %
Neutro Abs: 5.8 10*3/uL (ref 1.7–7.7)
Neutrophils Relative %: 61 %
Platelets: 337 10*3/uL (ref 150–400)
RBC: 4.96 MIL/uL (ref 3.87–5.11)
RDW: 15 % (ref 11.5–15.5)
WBC: 9.6 10*3/uL (ref 4.0–10.5)
nRBC: 0 % (ref 0.0–0.2)

## 2020-07-02 LAB — POCT PREGNANCY, URINE: Preg Test, Ur: NEGATIVE

## 2020-07-02 NOTE — Progress Notes (Signed)

## 2020-07-03 ENCOUNTER — Encounter (HOSPITAL_COMMUNITY): Payer: Self-pay | Admitting: Anesthesiology

## 2020-07-03 ENCOUNTER — Other Ambulatory Visit (HOSPITAL_COMMUNITY)
Admission: RE | Admit: 2020-07-03 | Discharge: 2020-07-03 | Disposition: A | Discharge status: Home / Self Care | Payer: Self-pay | Source: Ambulatory Visit | Attending: Surgery | Admitting: Surgery

## 2020-07-03 ENCOUNTER — Other Ambulatory Visit (HOSPITAL_COMMUNITY): Admission: RE | Admit: 2020-07-03 | Payer: Self-pay | Source: Ambulatory Visit

## 2020-07-03 DIAGNOSIS — U071 COVID-19: Secondary | ICD-10-CM | POA: Insufficient documentation

## 2020-07-03 DIAGNOSIS — Z01812 Encounter for preprocedural laboratory examination: Secondary | ICD-10-CM | POA: Insufficient documentation

## 2020-07-03 LAB — SARS CORONAVIRUS 2 BY RT PCR (HOSPITAL ORDER, PERFORMED IN ~~LOC~~ HOSPITAL LAB): SARS Coronavirus 2: POSITIVE — AB

## 2020-07-03 NOTE — Progress Notes (Signed)
Notified Dr. Rosezena Sensor office of positive covid result, spoke with Marcelino Duster, states she will call patient and breast center.

## 2020-07-04 ENCOUNTER — Ambulatory Visit (HOSPITAL_BASED_OUTPATIENT_CLINIC_OR_DEPARTMENT_OTHER): Admission: RE | Admit: 2020-07-04 | Payer: Self-pay | Source: Home / Self Care | Admitting: Surgery

## 2020-07-04 SURGERY — BREAST LUMPECTOMY WITH RADIOACTIVE SEED LOCALIZATION
Anesthesia: General | Site: Breast | Laterality: Right

## 2020-07-16 ENCOUNTER — Encounter (HOSPITAL_BASED_OUTPATIENT_CLINIC_OR_DEPARTMENT_OTHER): Payer: Self-pay | Admitting: Surgery

## 2020-07-16 ENCOUNTER — Other Ambulatory Visit: Payer: Self-pay

## 2020-07-23 ENCOUNTER — Ambulatory Visit (HOSPITAL_BASED_OUTPATIENT_CLINIC_OR_DEPARTMENT_OTHER)
Admission: RE | Admit: 2020-07-23 | Discharge: 2020-07-23 | Disposition: A | Discharge status: Home / Self Care | Payer: Self-pay | Attending: Surgery | Admitting: Surgery

## 2020-07-23 ENCOUNTER — Encounter (HOSPITAL_BASED_OUTPATIENT_CLINIC_OR_DEPARTMENT_OTHER): Payer: Self-pay | Admitting: Surgery

## 2020-07-23 ENCOUNTER — Other Ambulatory Visit: Payer: Self-pay

## 2020-07-23 ENCOUNTER — Ambulatory Visit (HOSPITAL_BASED_OUTPATIENT_CLINIC_OR_DEPARTMENT_OTHER): Payer: Self-pay | Admitting: Anesthesiology

## 2020-07-23 ENCOUNTER — Encounter (HOSPITAL_BASED_OUTPATIENT_CLINIC_OR_DEPARTMENT_OTHER)
Admission: RE | Disposition: A | Discharge status: Home / Self Care | Payer: Self-pay | Source: Home / Self Care | Attending: Surgery

## 2020-07-23 DIAGNOSIS — N611 Abscess of the breast and nipple: Secondary | ICD-10-CM | POA: Insufficient documentation

## 2020-07-23 DIAGNOSIS — N6121 Granulomatous mastitis, right breast: Secondary | ICD-10-CM | POA: Insufficient documentation

## 2020-07-23 DIAGNOSIS — Z79899 Other long term (current) drug therapy: Secondary | ICD-10-CM | POA: Insufficient documentation

## 2020-07-23 HISTORY — PX: BREAST LUMPECTOMY WITH RADIOACTIVE SEED LOCALIZATION: SHX6424

## 2020-07-23 LAB — POCT PREGNANCY, URINE: Preg Test, Ur: NEGATIVE

## 2020-07-23 SURGERY — BREAST LUMPECTOMY WITH RADIOACTIVE SEED LOCALIZATION
Anesthesia: General | Site: Breast | Laterality: Right

## 2020-07-23 MED ORDER — ONDANSETRON HCL 4 MG/2ML IJ SOLN: 4.0000 mg | Freq: Once | INTRAMUSCULAR | Status: DC | PRN

## 2020-07-23 MED ORDER — LACTATED RINGERS IV SOLN: INTRAVENOUS | Status: DC

## 2020-07-23 MED ORDER — OXYCODONE HCL 5 MG PO TABS: 5.0000 mg | ORAL_TABLET | Freq: Once | ORAL | Status: DC | PRN

## 2020-07-23 MED ORDER — MIDAZOLAM HCL 5 MG/5ML IJ SOLN
INTRAMUSCULAR | Status: DC | PRN
  Administered 2020-07-23: 2 mg via INTRAVENOUS

## 2020-07-23 MED ORDER — PROPOFOL 10 MG/ML IV BOLUS
INTRAVENOUS | Status: DC | PRN
  Administered 2020-07-23: 160 mg via INTRAVENOUS
  Administered 2020-07-23: 40 mg via INTRAVENOUS

## 2020-07-23 MED ORDER — DEXAMETHASONE SODIUM PHOSPHATE 10 MG/ML IJ SOLN
INTRAMUSCULAR | Status: DC | PRN
  Administered 2020-07-23: 5 mg via INTRAVENOUS

## 2020-07-23 MED ORDER — LIDOCAINE 2% (20 MG/ML) 5 ML SYRINGE
INTRAMUSCULAR | Status: AC
  Filled 2020-07-23: qty 5

## 2020-07-23 MED ORDER — ONDANSETRON HCL 4 MG/2ML IJ SOLN
INTRAMUSCULAR | Status: AC
  Filled 2020-07-23: qty 2

## 2020-07-23 MED ORDER — CHLORHEXIDINE GLUCONATE CLOTH 2 % EX PADS: 6.0000 | MEDICATED_PAD | Freq: Once | CUTANEOUS | Status: DC

## 2020-07-23 MED ORDER — EPHEDRINE 5 MG/ML INJ
INTRAVENOUS | Status: AC
  Filled 2020-07-23: qty 10

## 2020-07-23 MED ORDER — EPHEDRINE SULFATE 50 MG/ML IJ SOLN
INTRAMUSCULAR | Status: DC | PRN
  Administered 2020-07-23: 5 mg via INTRAVENOUS

## 2020-07-23 MED ORDER — PROPOFOL 10 MG/ML IV BOLUS
INTRAVENOUS | Status: AC
  Filled 2020-07-23: qty 20

## 2020-07-23 MED ORDER — OXYCODONE HCL 5 MG/5ML PO SOLN: 5.0000 mg | Freq: Once | ORAL | Status: DC | PRN

## 2020-07-23 MED ORDER — BUPIVACAINE HCL (PF) 0.25 % IJ SOLN
INTRAMUSCULAR | Status: DC | PRN
  Administered 2020-07-23: 25 mL

## 2020-07-23 MED ORDER — MEPERIDINE HCL 25 MG/ML IJ SOLN: 6.2500 mg | INTRAMUSCULAR | Status: DC | PRN

## 2020-07-23 MED ORDER — ACETAMINOPHEN 325 MG PO TABS: 325.0000 mg | ORAL_TABLET | ORAL | Status: DC | PRN

## 2020-07-23 MED ORDER — LIDOCAINE HCL (CARDIAC) PF 100 MG/5ML IV SOSY
PREFILLED_SYRINGE | INTRAVENOUS | Status: DC | PRN
  Administered 2020-07-23: 60 mg via INTRATRACHEAL

## 2020-07-23 MED ORDER — DEXAMETHASONE SODIUM PHOSPHATE 10 MG/ML IJ SOLN
INTRAMUSCULAR | Status: AC
  Filled 2020-07-23: qty 1

## 2020-07-23 MED ORDER — CEFAZOLIN SODIUM-DEXTROSE 2-4 GM/100ML-% IV SOLN
2.0000 g | INTRAVENOUS | Status: AC
  Administered 2020-07-23: 2 g via INTRAVENOUS

## 2020-07-23 MED ORDER — MIDAZOLAM HCL 2 MG/2ML IJ SOLN
INTRAMUSCULAR | Status: AC
  Filled 2020-07-23: qty 2

## 2020-07-23 MED ORDER — ACETAMINOPHEN 160 MG/5ML PO SOLN: 325.0000 mg | ORAL | Status: DC | PRN

## 2020-07-23 MED ORDER — CEFAZOLIN SODIUM-DEXTROSE 2-4 GM/100ML-% IV SOLN
INTRAVENOUS | Status: AC
  Filled 2020-07-23: qty 100

## 2020-07-23 MED ORDER — ONDANSETRON HCL 4 MG/2ML IJ SOLN
INTRAMUSCULAR | Status: DC | PRN
  Administered 2020-07-23: 4 mg via INTRAVENOUS

## 2020-07-23 MED ORDER — FENTANYL CITRATE (PF) 100 MCG/2ML IJ SOLN
25.0000 ug | INTRAMUSCULAR | Status: DC | PRN
Start: 2020-07-23 — End: 2020-07-23

## 2020-07-23 MED ORDER — FENTANYL CITRATE (PF) 100 MCG/2ML IJ SOLN
INTRAMUSCULAR | Status: AC
  Filled 2020-07-23: qty 2

## 2020-07-23 MED ORDER — OXYCODONE HCL 5 MG PO TABS: 5.0000 mg | ORAL_TABLET | Freq: Four times a day (QID) | ORAL | 0 refills | Status: DC | PRN

## 2020-07-23 MED ORDER — FENTANYL CITRATE (PF) 100 MCG/2ML IJ SOLN
INTRAMUSCULAR | Status: DC | PRN
  Administered 2020-07-23: 50 ug via INTRAVENOUS
  Administered 2020-07-23 (×2): 25 ug via INTRAVENOUS

## 2020-07-23 SURGICAL SUPPLY — 48 items
ADH SKN CLS APL DERMABOND .7 (GAUZE/BANDAGES/DRESSINGS) ×1
APL PRP STRL LF DISP 70% ISPRP (MISCELLANEOUS) ×1
APPLIER CLIP 9.375 MED OPEN (MISCELLANEOUS)
APR CLP MED 9.3 20 MLT OPN (MISCELLANEOUS)
BINDER BREAST XLRG (GAUZE/BANDAGES/DRESSINGS) ×2 IMPLANT
BINDER BREAST XXLRG (GAUZE/BANDAGES/DRESSINGS) IMPLANT
BLADE SURG 15 STRL LF DISP TIS (BLADE) ×1 IMPLANT
BLADE SURG 15 STRL SS (BLADE) ×2
CANISTER SUC SOCK COL 7IN (MISCELLANEOUS) IMPLANT
CHLORAPREP W/TINT 26 (MISCELLANEOUS) ×2 IMPLANT
CLIP APPLIE 9.375 MED OPEN (MISCELLANEOUS) IMPLANT
COVER BACK TABLE 60X90IN (DRAPES) ×2 IMPLANT
COVER MAYO STAND STRL (DRAPES) ×2 IMPLANT
COVER PROBE W GEL 5X96 (DRAPES) ×2 IMPLANT
COVER WAND RF STERILE (DRAPES) IMPLANT
DECANTER SPIKE VIAL GLASS SM (MISCELLANEOUS) IMPLANT
DERMABOND ADVANCED (GAUZE/BANDAGES/DRESSINGS) ×1
DERMABOND ADVANCED .7 DNX12 (GAUZE/BANDAGES/DRESSINGS) ×1 IMPLANT
DRAPE LAPAROTOMY 100X72 PEDS (DRAPES) ×2 IMPLANT
DRAPE UTILITY XL STRL (DRAPES) ×2 IMPLANT
ELECT COATED BLADE 2.86 ST (ELECTRODE) ×2 IMPLANT
ELECT REM PT RETURN 9FT ADLT (ELECTROSURGICAL) ×2
ELECTRODE REM PT RTRN 9FT ADLT (ELECTROSURGICAL) ×1 IMPLANT
GLOVE ECLIPSE 8.0 STRL XLNG CF (GLOVE) ×2 IMPLANT
GLOVE SRG 8 PF TXTR STRL LF DI (GLOVE) ×1 IMPLANT
GLOVE SURG ENC MOIS LTX SZ6.5 (GLOVE) ×4 IMPLANT
GLOVE SURG UNDER POLY LF SZ8 (GLOVE) ×2
GOWN STRL REUS W/ TWL LRG LVL3 (GOWN DISPOSABLE) ×2 IMPLANT
GOWN STRL REUS W/ TWL XL LVL3 (GOWN DISPOSABLE) ×2 IMPLANT
GOWN STRL REUS W/TWL LRG LVL3 (GOWN DISPOSABLE) ×4
GOWN STRL REUS W/TWL XL LVL3 (GOWN DISPOSABLE) ×4
HEMOSTAT ARISTA ABSORB 3G PWDR (HEMOSTASIS) IMPLANT
HEMOSTAT SNOW SURGICEL 2X4 (HEMOSTASIS) IMPLANT
KIT MARKER MARGIN INK (KITS) ×2 IMPLANT
NEEDLE HYPO 25X1 1.5 SAFETY (NEEDLE) ×2 IMPLANT
NS IRRIG 1000ML POUR BTL (IV SOLUTION) ×2 IMPLANT
PACK BASIN DAY SURGERY FS (CUSTOM PROCEDURE TRAY) ×2 IMPLANT
PENCIL SMOKE EVACUATOR (MISCELLANEOUS) ×2 IMPLANT
SLEEVE SCD COMPRESS KNEE MED (MISCELLANEOUS) ×2 IMPLANT
SPONGE LAP 4X18 RFD (DISPOSABLE) ×4 IMPLANT
SUT MNCRL AB 4-0 PS2 18 (SUTURE) ×2 IMPLANT
SUT SILK 2 0 SH (SUTURE) IMPLANT
SUT VICRYL 3-0 CR8 SH (SUTURE) ×2 IMPLANT
SYR CONTROL 10ML LL (SYRINGE) ×2 IMPLANT
TOWEL GREEN STERILE FF (TOWEL DISPOSABLE) ×2 IMPLANT
TRAY FAXITRON CT DISP (TRAY / TRAY PROCEDURE) ×2 IMPLANT
TUBE CONNECTING 20X1/4 (TUBING) IMPLANT
YANKAUER SUCT BULB TIP NO VENT (SUCTIONS) IMPLANT

## 2020-07-23 NOTE — Anesthesia Postprocedure Evaluation (Signed)
Anesthesia Post Note  Patient: Teacher, music  Procedure(s) Performed: RIGHT BREAST LUMPECTOMYX 2  WITH RADIOACTIVE SEED LOCALIZATION (Right Breast)     Patient location during evaluation: PACU Anesthesia Type: General Level of consciousness: awake and alert Pain management: pain level controlled Vital Signs Assessment: post-procedure vital signs reviewed and stable Respiratory status: spontaneous breathing, nonlabored ventilation, respiratory function stable and patient connected to nasal cannula oxygen Cardiovascular status: blood pressure returned to baseline and stable Postop Assessment: no apparent nausea or vomiting Anesthetic complications: no   No complications documented.  Last Vitals:  Vitals:   07/23/20 1030 07/23/20 1043  BP: 107/68 111/83  Pulse: 66 65  Resp: 13 15  Temp:  36.4 C  SpO2: 99% 100%    Last Pain:  Vitals:   07/23/20 1043  TempSrc:   PainSc: 0-No pain                 Nicholes Hibler

## 2020-07-23 NOTE — Anesthesia Preprocedure Evaluation (Addendum)
Anesthesia Evaluation  Patient identified by MRN, date of birth, ID band Patient awake    Reviewed: Allergy & Precautions, H&P , NPO status , Patient's Chart, lab work & pertinent test results  Airway Mallampati: II      Comment: LOWER BRACES Dental no notable dental hx. (+) Teeth Intact, Dental Advisory Given   Pulmonary neg pulmonary ROS,    Pulmonary exam normal breath sounds clear to auscultation       Cardiovascular Exercise Tolerance: Good negative cardio ROS   Rhythm:regular Rate:Normal     Neuro/Psych negative neurological ROS  negative psych ROS   GI/Hepatic negative GI ROS, Neg liver ROS,   Endo/Other  negative endocrine ROS  Renal/GU negative Renal ROS  negative genitourinary   Musculoskeletal   Abdominal Normal abdominal exam  (+)   Peds  Hematology negative hematology ROS (+)   Anesthesia Other Findings   Reproductive/Obstetrics                            Anesthesia Physical  Anesthesia Plan  ASA: II  Anesthesia Plan: General   Post-op Pain Management:    Induction:   PONV Risk Score and Plan: 3 and Ondansetron, Dexamethasone and Treatment may vary due to age or medical condition  Airway Management Planned: LMA and Oral ETT  Additional Equipment: None  Intra-op Plan:   Post-operative Plan:   Informed Consent: I have reviewed the patients History and Physical, chart, labs and discussed the procedure including the risks, benefits and alternatives for the proposed anesthesia with the patient or authorized representative who has indicated his/her understanding and acceptance.       Plan Discussed with: Anesthesiologist, CRNA and Surgeon  Anesthesia Plan Comments:         Anesthesia Quick Evaluation

## 2020-07-23 NOTE — Op Note (Signed)
Preoperative diagnosis: Chronic right breast abscess with mass  Postoperative diagnosis: Same  Procedure: Right breast seed lumpectomy x2  Surgeon: Erroll Luna, MD  Anesthesia: LMA with local consisting of 0.25% Marcaine plain  EBL: 10 cc  Specimen: Right breast tissue with 2 seeds verified by Faxitron  Drains: None  Indications for procedure: The patient is a 35 year old female who has had issues with chronic right breast abscesses.  There are 2 areas in the right upper outer quadrant of her breast that have recurred and required multiple aspirations.  These resolve but recur.  Core biopsy showed no evidence of granulomatous mastitis or malignancy but due to recurrence she was counseled on excision to hopefully help reduce that risk.  Risks and benefits of surgery were discussed as well as long-term expectations and success rates of roughly 80% 90% at relieving this problem.  This was done with the means of a translator.The procedure has been discussed with the patient. Alternatives to surgery have been discussed with the patient.  Risks of surgery include bleeding,  Infection,  Seroma formation, death,  and the need for further surgery.   The patient understands and wishes to proceed.   Description of procedure: The patient was met in the holding area and questions were answered.  Translator was available to answer questions.  Right breast was marked as correct site and neoprobe used to verify seed location.  She was then taken back to the operative room.  She is placed supine upon the OR table.  After induction of general esthesia, the right breast was prepped and draped in a sterile fashion and timeout performed.  Neoprobe was used to localize the seeds right breast upper outer quadrant and films were available for review.  Curvilinear incision was made along the lateral border of the nipple areolar complex.  Dissection was carried all tissue around both seeds and clip were excised with a  grossly negative margin.  Additional medial margin was taken.  This was oriented and sent to pathology.  Seed removal was verified by Faxitron.  The cavities made hemostatic with cautery irrigation was used and local anesthetic infiltrated.  It was then closed with 3-0 Vicryl and 4 Monocryl.  Dermabond applied.  All counts found to be correct.  Breast binder placed.  Patient was awoke extubated taken to recovery in satisfactory condition.

## 2020-07-23 NOTE — Transfer of Care (Signed)
Immediate Anesthesia Transfer of Care Note  Patient: Kristine Adams  Procedure(s) Performed: RIGHT BREAST LUMPECTOMYX 2  WITH RADIOACTIVE SEED LOCALIZATION (Right Breast)  Patient Location: PACU  Anesthesia Type:General  Level of Consciousness: drowsy, patient cooperative and responds to stimulation  Airway & Oxygen Therapy: Patient Spontanous Breathing and Patient connected to face mask oxygen  Post-op Assessment: Report given to RN and Post -op Vital signs reviewed and stable  Post vital signs: Reviewed and stable  Last Vitals:  Vitals Value Taken Time  BP    Temp    Pulse 89 07/23/20 1019  Resp    SpO2 100 % 07/23/20 1019  Vitals shown include unvalidated device data.  Last Pain:  Vitals:   07/23/20 0812  TempSrc: Oral  PainSc: 0-No pain         Complications: No complications documented.

## 2020-07-23 NOTE — Anesthesia Procedure Notes (Signed)
Procedure Name: LMA Insertion Date/Time: 07/23/2020 9:23 AM Performed by: Thornell Mule, CRNA Pre-anesthesia Checklist: Patient identified, Emergency Drugs available, Suction available and Patient being monitored Patient Re-evaluated:Patient Re-evaluated prior to induction Oxygen Delivery Method: Circle system utilized Preoxygenation: Pre-oxygenation with 100% oxygen Induction Type: IV induction LMA: LMA inserted LMA Size: 4.0 Number of attempts: 1 Placement Confirmation: positive ETCO2 Tube secured with: Tape Dental Injury: Teeth and Oropharynx as per pre-operative assessment

## 2020-07-23 NOTE — Discharge Instructions (Signed)
Tumorectoma, cuidados posteriores Lumpectomy, Care After Esta hoja le brinda informacin sobre cmo cuidarse despus del procedimiento. Su mdico tambin podr darle instrucciones ms especficas. Comunquese con el mdico si tiene problemas o preguntas. Qu puedo esperar despus del procedimiento? Despus del procedimiento, es comn DIRECTV siguientes sntomas:  Hinchazn de las [place].  Dolor a Multimedia programmer.  Rigidez en el brazo o el hombro.  Cambio en la forma y la sensibilidad de las [place].  Tejido cicatricial que se siente duro al tacto en la zona donde se extrajo el ndulo. Siga estas instrucciones en su casa: Medicamentos  [employer] de venta libre y los recetados solamente como se lo haya indicado el mdico.  Si le recetaron un antibitico, tmelo como se lo haya indicado el mdico. No deje de tomar los antibiticos aunque comience a [place].  Pregntele al mdico si el medicamento recetado: ? Hace necesario que evite conducir o usar maquinaria pesada. ? Puede causarle estreimiento. Es posible que tenga que tomar estas medidas para prevenir o tratar el estreimiento:  Beba suficiente lquido como para Pharmacologist la orina de color amarillo plido.  Tome medicamentos recetados o de venta [place].  Consuma alimentos ricos en fibra, como frijoles, cereales integrales, y frutas y verduras frescas.  Limite el consumo de alimentos ricos en grasa y azcares procesados, como alimentos fritos o dulces. Cuidados de la incisin  Siga las instrucciones del mdico acerca del cuidado de la incisin. Asegrese de hacer lo siguiente: ? Lvese las manos con agua y [place] antes y despus de cambiar la venda (vendaje). Use desinfectante para manos si no dispone de [place] y [place]. ? Cambie el vendaje como se lo haya indicado el mdico. ? No retire los puntos (suturas), la goma para cerrar la piel o las tiras [place]. Es posible que estos cierres cutneos deban  quedar puestos en la piel durante 2semanas o ms tiempo. Si los bordes de las tiras [address] empiezan a despegarse y Scientific laboratory technician, puede recortar los que estn sueltos. No retire las tiras [employer] por completo a menos que el mdico se lo indique.  Controle la zona de la incisin todos los [address]  [address] para detectar signos de infeccin. Est atenta a los siguientes signos: ? Aumento del enrojecimiento, la hinchazn o Chief Technology Officer. ? Lquido o sangre. ? Calor. ? Pus o mal olor.  Mantenga el vendaje limpio y seco.  Si la enviaron de regreso a su casa con un drenaje quirrgico colocado, siga las indicaciones del mdico sobre cmo vaciarlo.      Baos  No tome baos de inmersin, no nade ni use el jacuzzi hasta que el mdico la autorice.  Pregntele al mdico si puede ducharse. Delle Reining solo le permitan darse baos de [place]. Actividad  Haga reposo como se lo haya indicado el mdico.  Evite estar sentada durante largos perodos sin moverse. Levntese y camine un poco cada 1 a 2 horas. Esto es importante para mejorar el flujo sanguneo y la respiracin. Pida ayuda si se siente dbil o inestable.  Retome sus actividades normales segn lo indicado por el mdico. Pregntele al mdico qu actividades son seguras para usted.  Evite cualquier actividad que pueda causarle una lesin en el brazo que est del lado de la [place].  No levante ningn objeto que pese ms de 10libras (4,5kg) o el lmite de peso que le hayan indicado, hasta que el mdico le diga que puede [place]. Evite levantar objetos con el brazo que est del lado  de la [place].  No cargue objetos pesados sobre el hombro del lado de la [place].  Haga ejercicios para evitar que el hombro y el brazo se pongan rgidos y se hinchen. Consulte al [employer] tipos de ejercicios que son seguros para usted. Instrucciones generales  Use un sostn de soporte como se lo haya indicado su mdico.  Cuando est sentada o acostada, levante (eleve)  el brazo por encima del nivel del corazn.  No use anillos, pulseras ni otros accesorios ajustados en el brazo, la [place] o los dedos del lado de la [place].  Concurra a todas las visitas de [address] se lo haya indicado el mdico. Esto es importante. ? Probablemente necesite que le hagan un estudio para determinar la presencia de ms lquido alrededor de los ganglios linfticos e hinchazn en la mama y el brazo (linfedema). Siga las instrucciones del mdico acerca de la frecuencia con la que se debe hacer los controles.  Si le han extrado algn ganglio linftico durante el procedimiento, asegrese de darles toda la informacin a sus mdicos. Esta es una informacin importante que se debe compartir antes de ciertos procedimientos, como anlisis de [place] o medicin de presin arterial. Comunquese con un mdico si:  Presenta una erupcin cutnea.  Tiene fiebre.  Los analgsicos no [employer].  Tiene hinchazn, debilidad o adormecimiento en el brazo que no mejora despus de unas semanas.  Tiene una hinchazn nueva en la mama.  Tiene cualquiera de estos signos de infeccin: ? Ms enrojecimiento, hinchazn o dolor en la zona de la incisin. ? Lquido o sangre que salen de la incisin. ? Calor que proviene de la zona de la incisin. ? Pus o mal olor en Immunologist de la incisin. Solicite ayuda inmediatamente si tiene:  Investment banker, operational intenso en la mama o el brazo.  Hinchazn en las piernas o los brazos.  Enrojecimiento, calor o dolor en las piernas o los brazos.  Dolor de pecho.  Dificultad para respirar. Resumen  Despus del procedimiento, es comn tener sensibilidad al tacto e hinchazn en la mama, y rigidez en el brazo y [place].  Siga las instrucciones del mdico acerca del cuidado de la incisin.  No levante ningn objeto que pese ms de 10libras (4,5kg) o el lmite de peso que le hayan indicado, hasta que el mdico le diga que puede [place]. Evite levantar  objetos con el brazo que est del lado de la [place].  Si le han extrado algn ganglio linftico durante el procedimiento, asegrese de darles toda la informacin a sus mdicos. Esta es una informacin importante que se debe compartir antes de ciertos procedimientos, como anlisis de [place] o medicin de presin arterial. Esta informacin no tiene Theme park manager el consejo del mdico. Asegrese de hacerle al mdico cualquier pregunta que tenga. Document Revised: 01/25/2019 Document Reviewed: 01/25/2019 Elsevier Patient Education  2021 Elsevier Inc.  Janann August general, en adultos General Anesthesia, Adult La anestesia general es el uso de medicamentos para dormir a Physiological scientist persona (dejarla inconsciente) para un procedimiento mdico. La anestesia general se debe usar para ciertos procedimientos y normalmente se recomienda para procedimientos que:  Teaching laboratory technician.  Requieren que se quede quieto o que est en una posicin inusual.  Son importantes y pueden producir prdida de Retail buyer. Los medicamentos utilizados para la anestesia general se llaman anestsicos generales. Adems de dejarlo inconsciente durante una cierta cantidad de [place], estos medicamentos:  Geneticist, molecular.  Controlan su presin arterial.  Relajan los  msculos. Informe al mdico acerca de lo siguiente:  Cualquier alergia que tenga.  Todos los [place] Energymedicamentos que [place], incluidos vitaminas, hierbas, gotas oftlmicas, cremas y [address]amentos de [address] lib[address].  Cualquier problema previo que usted o algn miembro de su familia haya tenido con los anestsicos.  Tipos de anestsicos que le hayan administrado en el pasado.  Cualquier trastorno de la sangre que tenga.  Cirugas a las que se haya sometido.  Cualquier afeccin mdica que tenga.  Infecciones recientes en las vas respiratorias altas, el pecho o el odo.  Antecedentes de: ? Afecciones cardacas o pulmonares, como insuficiencia cardaca, apnea del sueo, asma  o enfermedad pulmonar obstructiva crnica (EPOC). ? Paramedicervicio militar. ? Depresin o ansiedad.  Cualquier consumo de tabaco o drogas ilegales, lo que incluye consumo de marihuana o alcohol.  Si est embarazada o podra estarlo. Cules son los riesgos? En general, se trata de un procedimiento seguro. Sin embargo, pueden ocurrir complicaciones, por ejemplo:  Automotive engineereaccin alrgica.  Problemas cardacos o pulmonares.  Inhalacin de alimentos o lquidos del estmago a los pulmones (aspiracin).  Lesiones en los nervios.  Lesin ONEOKen los dientes.  Aire en el torrente sanguneo, que puede provocar un accidente cerebrovascular.  Nerviosismo o confusin extremos (delirio) al despertarse de la anestesia.  Despertarse durante su procedimiento y no poder moverse. Esto es poco frecuente. Es ms probable que Set designersurjan estos problemas en caso de que le realicen una ciruga mayor o si tiene una afeccin mdica avanzada o grave. Puede evitar algunas de estas complicaciones respondiendo todas las preguntas del mdico meticulosamente y siguiendo todas las indicaciones que le brinden antes del procedimiento. La anestesia general puede causar efectos secundarios, como por ejemplo:  Nuseas o vmitos.  Dolor de garganta causado por el tubo respiratorio.  Ronquera.  Tos o sibilancias.  Escalofros.  Cansancio.  Dolores PepsiCoen el cuerpo.  Ansiedad.  Sueo o somnolencia.  Confusin o agitacin. Qu ocurre antes del procedimiento? Mantenerse hidratado Siga las instrucciones del mdico acerca de mantenerse hidratado, las cuales pueden incluir lo siguiente:  CIT GroupHasta dos horas antes del procedimiento, puede beber lquidos transparentes, como agua, jugos de fruta sin pulpa, caf negro y t solo.   Restricciones en las comidas y bebidas Siga las instrucciones del mdico respecto de las restricciones de comidas o bebidas, las cuales pueden incluir lo siguiente:  Ocho horas antes del procedimiento, deje de  ingerir comidas o alimentos pesados, como carne, alimentos fritos o alimentos grasos.  Seis horas antes del procedimiento, deje de ingerir comidas o alimentos livianos, como tostadas o cereales.  Seis horas antes del procedimiento, deje de beber [place]leche o bebidas que ConocoPhillipscontengan leche.  Dos horas antes del procedimiento, deje de beber lquidos transparentes. Medicamentos Consulte al mdico sobre:  Multimedia programmerCambiar o suspender los medicamentos que [place] habitualmente. Esto es muy importante si toma medicamentos para la diabetes o anticoagulantes.  Tomar medicamentos como aspirina e ibuprofeno. Estos medicamentos pueden tener un efecto anticoagulante en la [place] Citysangre. No tome estos medicamentos a menos que el mdico se lo indique.  Tomar medicamentos de H. J. Heinzventa libre, vitaminas, hierbas y suplementos. No tome estos medicamentos durante la semana anterior al procedimiento, a menos que el mdico lo autorice. Indicaciones generales  A partir de 3 a 6 semanas antes del procedimiento, no consuma ningn producto que contenga tabaco o nicotina, como cigarrillos y Administrator, Civil Servicecigarrillos electrnicos. Si necesita ayuda para dejar de fumar, consulte al mdico.  Si se lava los dientes la maana del procedimiento, asegrese de escupir toda la pasta de dientes.  Informe al mdico si se enferma o si se resfra, tiene tos o fiebre.  Si se lo indica el mdico, lleve el dispositivo para la apnea del sueo con usted el da de la ciruga (si corresponde).  Pregntele al mdico si volver a su casa el mismo da o al da siguiente, o si debe quedarse en el hospital por DIRECTV. ? Haga que un adulto responsable lo lleve a su casa desde el hospital o la clnica. ? Planifique que un adulto responsable lo cuide durante el tiempo que le indiquen despus de que le den el alta del hospital o de la [place]. Esto es importante. Qu ocurre durante el procedimiento?  Se le administrar la anestesia a travs lo siguiente: ? Earline [place] colocada  sobre su nariz y boca. ? Una va intravenosa en una de las venas.  Pueden administrarle un medicamento para que se relaje (sedante).  Una vez que est inconsciente, se le insertar un tubo respiratorio en la garganta para ayudarlo a respirar. Este se quitar antes de que despierte.  Un anestesista permanecer con usted durante todo el procedimiento. El mdico: ? Continuar administrndole medicamentos y ajustando la dosis para mantenerlo cmodo y Dispensing optician. ? Controlar la presin arterial, el pulso y los niveles de oxgeno para asegurarse de que la anestesia no le cause ningn problema. Este procedimiento puede variar segn el mdico y el hospital.   Ladell Heads ocurre despus del procedimiento?  Le controlarn la presin arterial, la temperatura, la frecuencia cardaca, la frecuencia respiratoria y Air cabin crew de oxgeno en la sangre hasta que desaparezca el efecto de los medicamentos administrados.  Se despertar en un rea de recuperacin. Puede que se despierte lentamente.  Si est agitado o ansioso, pueden darle medicamentos que lo ayuden a Animator.  Si volver a Sales promotion account executive, el mdico verificar que pueda caminar, beber y Geographical information systems officer.  El mdico tambin tratar Psychologist, sport and exercise o efecto secundario que tenga antes de que se vaya a su casa.  No conduzca ni opere maquinaria hasta que el mdico le indique que es seguro [place]. Resumen  La anestesia general se [place] para mantenerlo quieto y Psychologist, sport and exercise durante un procedimiento.  Es importante que le informe al mdico acerca de sus antecedentes mdicos y cualquier ciruga que haya tenido, as como su experiencia previa con la anestesia.  Siga las instrucciones del mdico acerca de cundo dejar de comer, beber o tomar ciertos medicamentos antes del procedimiento.  Haga que un adulto responsable lo lleve a su casa desde el hospital o la clnica. Esta informacin no tiene Theme park manager el consejo del mdico. Asegrese de hacerle al  mdico cualquier pregunta que tenga. Document Revised: 11/30/2019 Document Reviewed: 11/30/2019 Elsevier Patient Education  2021 Elsevier Inc.    Lumpectomy, Care After This sheet gives you information about how to care for yourself after your procedure. Your health care provider may also give you more specific instructions. If you have problems or questions, contact your health care provider. What can I expect after the procedure? After the procedure, it is common to have:  Breast swelling.  Breast tenderness.  Stiffness in your arm or shoulder.  A change in the shape and feel of your breast.  Scar tissue that feels hard to the touch in the area where the lump was removed. Follow these instructions at home: Medicines  Take over-the-counter and prescription medicines only as told by your health care provider.  If you were prescribed an antibiotic medicine, take  it as told by your health care provider. Do not stop taking the antibiotic even if you start to feel better.  Ask your health care provider if the medicine prescribed to you: ? Requires you to avoid driving or using heavy machinery. ? Can cause constipation. You may need to take these actions to prevent or treat constipation:  Drink enough fluid to keep your urine pale yellow.  Take over-the-counter or prescription medicines.  Eat foods that are high in fiber, such as beans, whole grains, and fresh fruits and vegetables.  Limit foods that are high in fat and processed sugars, such as fried or sweet foods. Incision care  Follow instructions from your health care provider about how to take care of your incision. Make sure you: ? Wash your hands with soap and water before and after you change your bandage (dressing). If soap and water are not available, use hand sanitizer. ? Change your dressing as told by your health care provider. ? Leave stitches (sutures), skin glue, or adhesive strips in place. These skin closures  may need to stay in place for 2 weeks or longer. If adhesive strip edges start to loosen and curl up, you may trim the loose edges. Do not remove adhesive strips completely unless your health care provider tells you to do that.  Check your incision area every day for signs of infection. Check for: ? More redness, swelling, or pain. ? Fluid or blood. ? Warmth. ? Pus or a bad smell.  Keep your dressing clean and dry.  If you were sent home with a surgical drain in place, follow instructions from your health care provider about emptying it.      Bathing  Do not take baths, swim, or use a hot tub until your health care provider approves.  Ask your health care provider if you may take showers. You may only be allowed to take sponge baths. Activity  Rest as told by your health care provider.  Avoid sitting for a long time without moving. Get up to take short walks every 1-2 hours. This is important to improve blood flow and breathing. Ask for help if you feel weak or unsteady.  Return to your normal activities as told by your health care provider. Ask your health care provider what activities are safe for you.  Be careful to avoid any activities that could cause an injury to your arm on the side of your surgery.  Do not lift anything that is heavier than 10 lb (4.5 kg), or the limit that you are told, until your health care provider says that it is safe. Avoid lifting with the arm that is on the side of your surgery.  Do not carry heavy objects on your shoulder on the side of your surgery.  Do exercises to keep your shoulder and arm from getting stiff and swollen. Talk with your health care provider about which exercises are safe for you. General instructions  Wear a supportive bra as told by your health care provider.  Raise (elevate) your arm above the level of your heart while you are sitting or lying down.  Do not wear tight jewelry on your arm, wrist, or fingers on the side of  your surgery.  Keep all follow-up visits as told by your health care provider. This is important. ? You may need to be screened for extra fluid around the lymph nodes and swelling in the breast and arm (lymphedema). Follow instructions from your health care provider  about how often you should be checked.  If you had any lymph nodes removed during your procedure, be sure to tell all of your health care providers. This is important information to share before you are involved in certain procedures, such as having blood tests or having your blood pressure taken. Contact a health care provider if:  You develop a rash.  You have a fever.  Your pain medicine is not working.  You have swelling, weakness, or numbness in your arm that does not improve after a few weeks.  You have new swelling in your breast.  You have any of these signs of infection: ? More redness, swelling, or pain in your incision area. ? Fluid or blood coming from your incision. ? Warmth coming from the incision area. ? Pus or a bad smell coming from your incision. Get help right away if you have:  Very bad pain in your breast or arm.  Swelling in your legs or arms.  Redness, warmth, or pain in your leg or arm.  Chest pain.  Difficulty breathing. Summary  After the procedure, it is common to have breast tenderness, swelling in your breast, and stiffness in your arm and shoulder.  Follow instructions from your health care provider about how to take care of your incision.  Do not lift anything that is heavier than 10 lb (4.5 kg), or the limit that you are told, until your health care provider says that it is safe. Avoid lifting with the arm that is on the side of your surgery.  If you had any lymph nodes removed during your procedure, be sure to tell all of your health care providers. This is important information to share before you are involved in certain procedures, such as having blood tests or having your  blood pressure taken. This information is not intended to replace advice given to you by your health care provider. Make sure you discuss any questions you have with your health care provider. Document Revised: 12/12/2018 Document Reviewed: 12/12/2018 Elsevier Patient Education  2021 Elsevier Inc.   Post Anesthesia Home Care Instructions  Activity: Get plenty of rest for the remainder of the day. A responsible individual must stay with you for 24 hours following the procedure.  For the next 24 hours, DO NOT: -Drive a car -Advertising copywriter -Drink alcoholic beverages -Take any medication unless instructed by your physician -Make any legal decisions or sign important papers.  Meals: Start with liquid foods such as gelatin or soup. Progress to regular foods as tolerated. Avoid greasy, spicy, heavy foods. If nausea and/or vomiting occur, drink only clear liquids until the nausea and/or vomiting subsides. Call your physician if vomiting continues.  Special Instructions/Symptoms: Your throat may feel dry or sore from the anesthesia or the breathing tube placed in your throat during surgery. If this causes discomfort, gargle with warm salt water. The discomfort should disappear within 24 hours.  If you had a scopolamine patch placed behind your ear for the management of post- operative nausea and/or vomiting:  1. The medication in the patch is effective for 72 hours, after which it should be removed.  Wrap patch in a tissue and discard in the trash. Wash hands thoroughly with soap and water. 2. You may remove the patch earlier than 72 hours if you experience unpleasant side effects which may include dry mouth, dizziness or visual disturbances. 3. Avoid touching the patch. Wash your hands with soap and water after contact with the patch.

## 2020-07-23 NOTE — H&P (Signed)
Kristine Adams  Location: [facility] Patient #: [id number] DOB: 10-Aug-1985 Single / Language: Undefined / Race: Refused to Report/Unreported Female  History of Present IllnessPatient words: Patient presents to the office for evaluation of right breast pain. History of complex right breast abscesses. This is being managed medically since July of this year. She does have a remote history of left breast granulomatous mastitis that improved with steroid/year. She underwent core biopsy of the right breast mass which showed inflammation and abscess but no signs granulomatous mastitis. She had multiple bouts of aspiration in the area will not clear up despite antibiotics and multiple bouts of aspiration. She presents for evaluation of her right breast chronic abscess and mass. Her symptoms come and go. She said issues since July of this year on the right side and it is not resolved despite multiple bouts of antibiotics. She's also had multiple aspirations. It's a complex area of multiple small fluid collections involving about 3 cm of breast tissue at about 8:00 looks like. I discussed the case with the radiologist as well. She has no fever or chills. She does complain of fatigue and mood change.  The patient is a 35 year old female.   Allergies  No Known Drug Allergies [02/01/2019]: Allergies Reconciled  Medication History Bactrim DS (800-160MG  Tablet, 1 (one) Oral two times daily, Taken starting 02/01/2019) Active. Ferrous Sulfate (325MG  Capsule, Oral) Active. Clindamycin HCl (150MG  Capsule, Oral) Active. Medications Reconciled     Review of Systems ( PM) All other systems negative  Vitals 05/21/2020 1:47 PM Weight: 172 lb Height: 62in Body Surface Area: 1.79 m Body Mass Index: 31.46 kg/m  Temp.: 98.8F  Pulse: 94 (Regular)  P.OX: 100% (Room air) BP: 118/68(Sitting, Left Arm, Standard)        Physical Exam    General Mental Status-Alert. General Appearance-Consistent with stated age. Hydration-Well hydrated. Voice-Normal.  Breast Note: On the lateral border of the nipple complex is an area of ecchymoses. There is no redness or fluctuance. There is full and sore. No evidence of abscess on exam. This is at about the 8:00 to 9 o'clock position. Left breast is normal.  Neurologic Neurologic evaluation reveals -alert and oriented x 3 with no impairment of recent or remote memory. Mental Status-Normal.  Lymphatic Head & Neck  General Head & Neck Lymphatics: Bilateral - Description - Normal. Axillary  General Axillary Region: Bilateral - Description - Normal. Tenderness - Non Tender.    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 Since this does not improve with medical treatment despite multiple bouts of antibiotics and aspiration, recommended excision of this area to hopefully improve her symptoms. Chance of recurrence. Risks and benefits discussed. A translator was available to assist with today's exam as well as discussion of surgical options. I discussed the case with Dr. of radiology she will be able to bracket this area to clip she thinks to remove this and states a recurrent problem that is failed medical management. Her core biopsies were done of this which did not show evidence of granulomatous mastitis therefore do not think steroids would be beneficial.   Risk of lumpectomy include bleeding, infection, seroma, more surgery, use of seed/wire, wound care, cosmetic deformity and the need for other treatments, death , blood clots, death. Pt agrees to proceed.  Current Plans Pt Education -  [facility] Breast Biopsy HCI: discussed with patient and provided  information. Pt Education - Pamphlet Given - Breast Biopsy: discussed with patient and provided information. Pt Education - [facility] Breast Pains Education

## 2020-07-23 NOTE — Interval H&P Note (Signed)
History and Physical Interval Note:  07/23/2020 9:04 AM  Kristine Adams  has presented today for surgery, with the diagnosis of RIGHT BREAST MASTITIS.  The various methods of treatment have been discussed with the patient and family. After consideration of risks, benefits and other options for treatment, the patient has consented to  Procedure(s): RIGHT BREAST LUMPECTOMYX 2  WITH RADIOACTIVE SEED LOCALIZATION (Right) as a surgical intervention.  The patient's history has been reviewed, patient examined, no change in status, stable for surgery.  I have reviewed the patient's chart and labs.  Questions were answered to the patient's satisfaction.     Moana Munford A Lace Chenevert

## 2020-07-24 ENCOUNTER — Encounter (HOSPITAL_BASED_OUTPATIENT_CLINIC_OR_DEPARTMENT_OTHER): Payer: Self-pay | Admitting: Surgery

## 2020-07-29 LAB — SURGICAL PATHOLOGY

## 2021-01-07 ENCOUNTER — Other Ambulatory Visit: Payer: Self-pay

## 2021-01-07 ENCOUNTER — Ambulatory Visit: Payer: Self-pay | Admitting: *Deleted

## 2021-01-07 VITALS — BP 108/70 | Wt 184.0 lb

## 2021-01-07 DIAGNOSIS — Z1239 Encounter for other screening for malignant neoplasm of breast: Secondary | ICD-10-CM

## 2021-01-07 DIAGNOSIS — N644 Mastodynia: Secondary | ICD-10-CM

## 2021-01-07 NOTE — Patient Instructions (Signed)
Explained breast self awareness with Kristine Adams. Patient did not need a Pap smear today due to last Pap smear was in July 2021 per patient. Let her know BCCCP will cover Pap smears every 3 years unless has a history of abnormal Pap smears. Referred patient to [facility] for a diagnostic mammogram. Appointment scheduled Friday, January 10, 2021 at 0815. Patient aware of appointment and will be there. Kristine Adams verbalized understanding.  Zuriel Yeaman, [name], RN 3:35 PM

## 2021-01-07 NOTE — Progress Notes (Signed)
Kristine Adams is a 35 y.o. female who presents to [facility] clinic today with complaint of bilateral breast pain and swelling since February 2022 after her surgery. Patient states the pain comes and goes. Patient rates the pain at a 10 out of 10.    Pap Smear: Pap smear not completed today. Last Pap smear was in July 2021 at [facility]) clinic and was normal per patient. Per patient has no history of an abnormal Pap smear. Last Pap smear result is not available in Epic.  Physical exam: Breasts Left breast slightly larger than right breast. Patient has history of right breast lumpectomy 07/23/2020 due to right breast abscess. No skin abnormalities bilateral breasts. No nipple retraction bilateral breasts. No nipple discharge bilateral breasts. No lymphadenopathy. No lumps palpated bilateral breasts. Complaints of right diffuse breast pain on exam that was greater around the nipple area and at 9 o'clock.        Pelvic/Bimanual Pap is not indicated today per BCCCP guidelines.   Smoking History: Patient has never smoked.   Patient Navigation: Patient education provided. Access to services provided for patient through [facility] program. Spanish interpreter Natale Lay from [facility] provided.    Breast and Cervical Cancer Risk Assessment: Patient does not have family history of breast cancer, known genetic mutations, or radiation treatment to the chest before age 68. Patient does not have history of cervical dysplasia, immunocompromised, or DES exposure in-utero.  Risk Assessment     Risk Scores       01/07/2021 03/26/2020   Last edited by: Meryl Dare, CMA McGill, Sherie Demetrius Charity, LPN   5-year risk: 0.2 % 0.1 %   Lifetime risk: 5.2 % 5.3 %            A: BCCCP exam without pap smear Complaint of bilateral breast pain and swelling.  P: Referred patient to [facility] for a diagnostic mammogram. Appointment scheduled Friday, January 10, 2021 at  0815.  Priscille Heidelberg, RN 01/07/2021 3:35 PM

## 2021-02-03 ENCOUNTER — Encounter: Payer: Self-pay | Admitting: Obstetrics & Gynecology

## 2021-02-03 ENCOUNTER — Ambulatory Visit: Payer: Self-pay | Admitting: Obstetrics & Gynecology

## 2021-02-03 ENCOUNTER — Other Ambulatory Visit: Payer: Self-pay

## 2021-02-03 VITALS — BP 105/62 | HR 74 | Ht 62.0 in | Wt 184.0 lb

## 2021-02-03 DIAGNOSIS — N760 Acute vaginitis: Secondary | ICD-10-CM

## 2021-02-03 MED ORDER — FLUCONAZOLE 150 MG PO TABS: 150.0000 mg | ORAL_TABLET | Freq: Once | ORAL | 1 refills | Status: AC

## 2021-02-03 NOTE — Progress Notes (Signed)
   GYN VISIT Patient name: Kristine Adams MRN [id number]  Date of birth: 01-Oct-1985 Chief Complaint:   Vaginal Itching (Irritation and odor, currently on flagyl and diflucan)  History of Present Illness:   Kristine Adams is a 35 y.o. [id number]  female being seen today for the following:.     Recurrent vaginitis: Notes h/o recurrent vaginitis for the past 77yrs, typically a yeast infection.  In the past, she has been treated with both Diflucan and metronidazole.  The medication will work, but it will return within a few mos. Of note, she is currently on treatment and has noted improvement of her symptoms.  Previously noted considerable itching, mostly external.  Also noted burning sensation and vaginal odor.  Of note, she is in the process of DM testing and per pt was previously told she was a pre-diabetic.  Patient's last menstrual period was 01/06/2021 (exact date).  Review of Systems:   Pertinent items are noted in HPI Denies fever/chills, dizziness, headaches, visual disturbances, fatigue, shortness of breath, chest pain, abdominal pain, vomiting, no problems with periods, bowel movements, urination, or intercourse unless otherwise stated above.  Pertinent History Reviewed:  Reviewed past medical,surgical, social, obstetrical and family history.  Reviewed problem list, medications and allergies. Physical Assessment:   Vitals:   02/03/21 1614  BP: 105/62  Pulse: 74  Weight: 184 lb (83.5 kg)  Height: 5\' 2"  (1.575 m)  Body mass index is 33.65 kg/m.       Physical Examination:   General appearance: alert, well appearing, and in no distress  Psych: mood appropriate, normal affect  Skin: warm & dry   Cardiovascular: normal heart rate noted  Respiratory: normal respiratory effort, no distress  Abdomen: soft, non-tender   Pelvic: deferred  Extremities: no edema   Chaperone: N/A    Assessment & Plan:  1) Recurrent vaginitis Based on clinical history concern  for hyperglycemia as contributing factor.  Discussed triggering factors such as high sugars, frequent IC, etc. For now plan to treat with Diflucan weekly or as needed []  also plan to obtain prior records with prior testing and results of lab work F/U prn or if no improvement with current medication  Also recommended vitamin D level be evaluated as this can also impact recurrent vaginitis  Questions and concerns were addressed  Return if symptoms worsen or fail to improve.   , DO Attending Obstetrician & Gynecologist, [facility] for Myna Hidalgo, [facility]

## 2021-02-03 NOTE — Addendum Note (Signed)
Addended by: Sharon Seller on: 02/03/2021 04:57 PM   Modules accepted: Level of Service

## 2021-05-29 ENCOUNTER — Telehealth: Payer: Self-pay | Admitting: Obstetrics & Gynecology

## 2021-05-29 NOTE — Telephone Encounter (Signed)
Dr Clint Bolder in [facility] [phone]

## 2021-05-29 NOTE — Telephone Encounter (Signed)
Patient called stating that she had her tubes tied and she would like a reversal on this procedure. I informed the patient that we do not do that here in the office but I could see if there is someone Dr. Despina Hidden recommends. Please feel free to message me back, patient speaks spanish.

## 2021-07-02 ENCOUNTER — Ambulatory Visit (INDEPENDENT_AMBULATORY_CARE_PROVIDER_SITE_OTHER): Payer: Self-pay | Admitting: Adult Health

## 2021-07-02 ENCOUNTER — Other Ambulatory Visit: Payer: Self-pay

## 2021-07-02 ENCOUNTER — Other Ambulatory Visit (HOSPITAL_COMMUNITY)
Admission: RE | Admit: 2021-07-02 | Discharge: 2021-07-02 | Disposition: A | Discharge status: Home / Self Care | Payer: Self-pay | Source: Ambulatory Visit | Attending: Adult Health | Admitting: Adult Health

## 2021-07-02 ENCOUNTER — Encounter: Payer: Self-pay | Admitting: Adult Health

## 2021-07-02 VITALS — BP 100/67 | HR 75 | Ht 62.0 in | Wt 181.0 lb

## 2021-07-02 DIAGNOSIS — N949 Unspecified condition associated with female genital organs and menstrual cycle: Secondary | ICD-10-CM | POA: Insufficient documentation

## 2021-07-02 DIAGNOSIS — N898 Other specified noninflammatory disorders of vagina: Secondary | ICD-10-CM

## 2021-07-02 DIAGNOSIS — N9489 Other specified conditions associated with female genital organs and menstrual cycle: Secondary | ICD-10-CM

## 2021-07-02 DIAGNOSIS — N760 Acute vaginitis: Secondary | ICD-10-CM

## 2021-07-02 MED ORDER — FLUCONAZOLE 100 MG PO TABS: ORAL_TABLET | ORAL | 0 refills | Status: DC

## 2021-07-02 NOTE — Progress Notes (Signed)
°  Subjective:     Patient ID: Kristine Adams, female   DOB: 20-Dec-1985, 36 y.o.   MRN: [id number]  HPI Kristine Adams is a 36 year old Hispanic female, married, [id number], in complaining of vaginal burning and itching. Has interpreter on phone. PCP is [facility].  Review of Systems +vaginal itching +vaginal burning Burns when pees some Pain with sex Reviewed past medical,surgical, social and family history. Reviewed medications and allergies.     Objective:   Physical Exam BP 100/67 (BP Location: Left Arm, Patient Position: Sitting, Cuff Size: Normal)    Pulse 75    Ht 5\' 2"  (1.575 m)    Wt 181 lb (82.1 kg)    LMP 06/08/2021    BMI 33.11 kg/m     Skin warm and dry.Pelvic: external genitalia is normal in appearance no lesions, vagina: white thick discharge without odor, vaginal walls are red,urethra has no lesions or masses noted, cervix:smooth and bulbous, uterus: normal size, shape and contour, non tender, no masses felt, adnexa: no masses or tenderness noted. Bladder is non tender and no masses felt. CV swab obtained. Painted vagina with gentian violet Fall risk is low  Upstream - 07/02/21 1621       Pregnancy Intention Screening   Does the patient want to become pregnant in the next year? No    Does the patient's partner want to become pregnant in the next year? No    Would the patient like to discuss contraceptive options today? No      Contraception Wrap Up   Current Method Female Sterilization    End Method Female Sterilization    Contraception Counseling Provided No            Examination chaperoned by 08/30/21 LPN  Assessment:     1. Vaginal itching CV swab sent for GC/CHL,trich,BV and yeast   2. Vaginal burning CV swab sent  3. Recurrent vaginitis Painted vagina with gentian violet and will rx diflucan Do not wash off tonight, pat dry  No sex for 2 weeks  Meds ordered this encounter  Medications   fluconazole (DIFLUCAN) 100 MG tablet    Sig: Take 1  daily for 10 days    Dispense:  10 tablet    Refill:  0    Order Specific Question:   Supervising Provider    Answer:   Malachy Mood [[id number]]       Plan:     Follow up with me in 2 weeks

## 2021-07-04 ENCOUNTER — Other Ambulatory Visit: Payer: Self-pay | Admitting: Adult Health

## 2021-07-04 LAB — CERVICOVAGINAL ANCILLARY ONLY
Bacterial Vaginitis (gardnerella): NEGATIVE
Candida Glabrata: NEGATIVE
Candida Vaginitis: POSITIVE — AB
Chlamydia: NEGATIVE
Comment: NEGATIVE
Comment: NEGATIVE
Comment: NEGATIVE
Comment: NEGATIVE
Comment: NEGATIVE
Comment: NORMAL
Neisseria Gonorrhea: NEGATIVE
Trichomonas: NEGATIVE

## 2021-07-16 ENCOUNTER — Ambulatory Visit: Payer: Self-pay | Admitting: Adult Health

## 2021-07-21 ENCOUNTER — Ambulatory Visit: Payer: Self-pay | Admitting: Adult Health

## 2021-07-28 ENCOUNTER — Ambulatory Visit (INDEPENDENT_AMBULATORY_CARE_PROVIDER_SITE_OTHER): Payer: Self-pay | Admitting: Adult Health

## 2021-07-28 ENCOUNTER — Other Ambulatory Visit: Payer: Self-pay

## 2021-07-28 ENCOUNTER — Encounter: Payer: Self-pay | Admitting: Adult Health

## 2021-07-28 VITALS — BP 118/61 | HR 73 | Ht 62.0 in | Wt 177.0 lb

## 2021-07-28 DIAGNOSIS — L292 Pruritus vulvae: Secondary | ICD-10-CM

## 2021-07-28 MED ORDER — TRIAMCINOLONE ACETONIDE 0.5 % EX OINT: TOPICAL_OINTMENT | CUTANEOUS | 1 refills | Status: DC

## 2021-07-28 NOTE — Progress Notes (Signed)
°  Subjective:     Patient ID: Kristine Adams, female   DOB: [birth year], 36 y.o.   MRN: [id number]  HPI Kristine Adams is a 36 year old Hispanic female, married, [id number], back in follow up in being treated for yeast 07/02/21 with diflucan and gentian violet, and feels much better,an interpreter is with her. CV swab was +yeast, 07/02/21. PCP is [facility].  Review of Systems Feels much better A little itching on vulva Has decreased sex drive, increase frequency of sex to see if helps  Denies any hot flashes or irregular periods No pain with sex Reviewed past medical,surgical, social and family history. Reviewed medications and allergies.     Objective:   Physical Exam BP 118/61 (BP Location: Right Arm, Patient Position: Sitting, Cuff Size: Normal)    Pulse 73    Ht 5\' 2"  (1.575 m)    Wt 177 lb (80.3 kg)    LMP 07/09/2021 (Exact Date)    BMI 32.37 kg/m     Skin warm and dry.Pelvic: external genitalia is normal in appearance no lesions, vagina: pink.  Upstream - 07/28/21 1036       Pregnancy Intention Screening   Does the patient want to become pregnant in the next year? No    Does the patient's partner want to become pregnant in the next year? No    Would the patient like to discuss contraceptive options today? No      Contraception Wrap Up   Current Method Female Sterilization    End Method Female Sterilization    Contraception Counseling Provided No            Examination chaperoned by 09/25/21 LPN  Assessment:     1. Itching of vulva Will rx kenalog ointment for the itching Meds ordered this encounter  Medications   triamcinolone ointment (KENALOG) 0.5 %    Sig: Use 1-2 x daily to affected external area as needed    Dispense:  30 g    Refill:  1    Order Specific Question:   Supervising Provider    Answer:   Faith Rogue [[id number]]       Plan:     Return in 7 months for pap and physical

## 2021-10-14 ENCOUNTER — Encounter (HOSPITAL_COMMUNITY): Payer: Self-pay

## 2022-01-05 ENCOUNTER — Other Ambulatory Visit: Payer: Self-pay

## 2022-01-05 ENCOUNTER — Emergency Department (HOSPITAL_COMMUNITY)
Admission: EM | Admit: 2022-01-05 | Discharge: 2022-01-06 | Disposition: A | Discharge status: Home / Self Care | Payer: Self-pay | Attending: Emergency Medicine | Admitting: Emergency Medicine

## 2022-01-05 ENCOUNTER — Encounter (HOSPITAL_COMMUNITY): Payer: Self-pay | Admitting: Emergency Medicine

## 2022-01-05 ENCOUNTER — Emergency Department (HOSPITAL_COMMUNITY): Payer: Self-pay

## 2022-01-05 DIAGNOSIS — X501XXA Overexertion from prolonged static or awkward postures, initial encounter: Secondary | ICD-10-CM | POA: Insufficient documentation

## 2022-01-05 DIAGNOSIS — S83004A Unspecified dislocation of right patella, initial encounter: Secondary | ICD-10-CM | POA: Insufficient documentation

## 2022-01-05 MED ORDER — HYDROMORPHONE HCL 1 MG/ML IJ SOLN
1.0000 mg | Freq: Once | INTRAMUSCULAR | Status: AC
  Administered 2022-01-05: 1 mg via INTRAVENOUS
  Filled 2022-01-05: qty 1

## 2022-01-05 MED ORDER — HYDROCODONE-ACETAMINOPHEN 5-325 MG PO TABS: 1.0000 | ORAL_TABLET | ORAL | 0 refills | Status: DC | PRN

## 2022-01-05 MED ORDER — ONDANSETRON HCL 4 MG/2ML IJ SOLN
4.0000 mg | Freq: Once | INTRAMUSCULAR | Status: AC
  Administered 2022-01-05: 4 mg via INTRAVENOUS
  Filled 2022-01-05: qty 2

## 2022-01-05 NOTE — ED Provider Notes (Signed)
[facility] EMERGENCY DEPARTMENT Provider Note   CSN: [id number] Arrival date & time: 01/05/22  2117     History  Chief Complaint  Patient presents with   Knee Pain    Kristine Adams is a 36 y.o. female.  Patient presents to the emergency department for a right knee injury.  Patient slipped and twisted her knee prior to coming to the ER.  Patient with severe pain and inability to bend the knee.  She did not hit her head or injure any other parts of her body.       Home Medications Prior to Admission medications   Medication Sig Start Date End Date Taking? Authorizing Provider  HYDROcodone-acetaminophen (NORCO/VICODIN) 5-325 MG tablet Take 1 tablet by mouth every 4 (four) hours as needed. 01/05/22  Yes Shamirah Ivan, Canary Brim, MD  ferrous sulfate 325 (65 FE) MG tablet Take 325 mg by mouth daily with breakfast.    [provider]  ibuprofen (ADVIL) 800 MG tablet Take 800 mg by mouth.    [provider]  triamcinolone ointment (KENALOG) 0.5 % Use 1-2 x daily to affected external area as needed 07/28/21   Adline Potter, NP      Allergies    Patient has no known allergies.    Review of Systems   Review of Systems  Physical Exam Updated Vital Signs BP 101/75   Pulse 63   Temp 98.2 F (36.8 C)   Resp 14   Ht 5\' 3"  (1.6 m)   Wt 80.3 kg   LMP 11/22/2021   SpO2 100%   BMI 31.36 kg/m  Physical Exam Vitals and nursing note reviewed.  Constitutional:      Appearance: Normal appearance.  HENT:     Head: Normocephalic.  Eyes:     Pupils: Pupils are equal, round, and reactive to light.  Cardiovascular:     Rate and Rhythm: Normal rate.  Pulmonary:     Effort: Pulmonary effort is normal.  Musculoskeletal:     Right hip: Normal.     Right knee: Swelling and deformity (Lateral displacement of patella) present. No erythema or ecchymosis. Decreased range of motion. Tenderness present.  Neurological:     Mental Status: She is alert.      ED Results / Procedures / Treatments   Labs (all labs ordered are listed, but only abnormal results are displayed) Labs Reviewed - No data to display  EKG None  Radiology DG Knee 1-2 Views Right  Result Date: 01/05/2022 CLINICAL DATA:  01/07/2022, right knee pain, deformity EXAM: RIGHT KNEE - 1-2 VIEW COMPARISON:  None Available. FINDINGS: Frontal and cross-table lateral views of the right knee are obtained. There is lateral patellar dislocation. No evidence of displaced fracture. No joint effusion. Soft tissues are unremarkable. IMPRESSION: 1. Lateral patellar dislocation. No evidence of fracture. Repeat imaging after reduction recommended. Electronically Signed   By: Larey Seat M.D.   On: 01/05/2022 21:52    Procedures .Ortho Injury Treatment  Date/Time: 01/05/2022 11:53 PM  Performed by: 01/07/2022, MD Authorized by: Gilda Crease, MD   Consent:    Consent obtained:  Verbal   Consent given by:  Patient   Risks discussed:  Restricted joint movement, irreducible dislocation and recurrent dislocationInjury location: knee Location details: right knee Injury type: dislocation Dislocation type: lateral patellar Pre-procedure neurovascular assessment: neurovascularly intact Pre-procedure distal perfusion: normal Pre-procedure neurological function: normal Pre-procedure range of motion: reduced  Anesthesia: Local anesthesia used: no  Patient sedated:  NoManipulation performed: yes Reduction method: Extension of knee with slight medial pressure on patella. Reduction successful: yes X-ray confirmed reduction: yes Immobilization: Knee immobilizer. Post-procedure neurovascular assessment: post-procedure neurovascularly intact Post-procedure distal perfusion: normal Post-procedure neurological function: normal Post-procedure range of motion: improved       Medications Ordered in ED Medications  HYDROmorphone (DILAUDID) injection 1 mg (1 mg Intravenous  Given 01/05/22 2340)  ondansetron (ZOFRAN) injection 4 mg (4 mg Intravenous Given 01/05/22 2340)    ED Course/ Medical Decision Making/ A&P                           Medical Decision Making Risk Prescription drug management.   Presents to the emergency department for evaluation of knee injury.  X-ray shows lateral patella dislocation without fracture.  This was reduced.  Immobilized with knee immobilizer, follow-up with orthopedics.        Final Clinical Impression(s) / ED Diagnoses Final diagnoses:  Patellar dislocation, right, initial encounter    Rx / DC Orders ED Discharge Orders          Ordered    HYDROcodone-acetaminophen (NORCO/VICODIN) 5-325 MG tablet  Every 4 hours PRN        01/05/22 2356    Ambulatory referral to Orthopedic Surgery        01/05/22 2356              Gilda Crease, MD 01/06/22 [phone]

## 2022-01-05 NOTE — ED Triage Notes (Signed)
Pt c/o right knee pain after fall on play ground. Pt has obvious injury to the knee.

## 2022-01-05 NOTE — ED Provider Notes (Incomplete)
[facility] EMERGENCY DEPARTMENT Provider Note   CSN: [id number] Arrival date & time: 01/05/22  2117     History {Add pertinent medical, surgical, social history, OB history to HPI:1} Chief Complaint  Patient presents with  . Knee Pain    Kristine Adams Ardyth Harps is a 36 y.o. female.  Patient presents to the emergency department for a right knee injury.  Patient slipped and twisted her knee prior to coming to the ER.  Patient with severe pain and inability to bend the knee.  She did not hit her head or injure any other parts of her body.       Home Medications Prior to Admission medications   Medication Sig Start Date End Date Taking? Authorizing Provider  HYDROcodone-acetaminophen (NORCO/VICODIN) 5-325 MG tablet Take 1 tablet by mouth every 4 (four) hours as needed. 01/05/22  Yes Lylee Corrow, Canary Brim, MD  ferrous sulfate 325 (65 FE) MG tablet Take 325 mg by mouth daily with breakfast.    [provider]  ibuprofen (ADVIL) 800 MG tablet Take 800 mg by mouth.    [provider]  triamcinolone ointment (KENALOG) 0.5 % Use 1-2 x daily to affected external area as needed 07/28/21   Adline Potter, NP      Allergies    Patient has no known allergies.    Review of Systems   Review of Systems  Physical Exam Updated Vital Signs BP 101/75   Pulse 63   Temp 98.2 F (36.8 C)   Resp 14   Ht 5\' 3"  (1.6 m)   Wt 80.3 kg   LMP 11/22/2021   SpO2 100%   BMI 31.36 kg/m  Physical Exam Vitals and nursing note reviewed.  Constitutional:      Appearance: Normal appearance.  HENT:     Head: Normocephalic.  Eyes:     Pupils: Pupils are equal, round, and reactive to light.  Cardiovascular:     Rate and Rhythm: Normal rate.  Pulmonary:     Effort: Pulmonary effort is normal.  Musculoskeletal:     Right hip: Normal.     Right knee: Swelling and deformity (Lateral displacement of patella) present. No erythema or ecchymosis. Decreased range of motion.  Tenderness present.  Neurological:     Mental Status: She is alert.     ED Results / Procedures / Treatments   Labs (all labs ordered are listed, but only abnormal results are displayed) Labs Reviewed - No data to display  EKG None  Radiology DG Knee 1-2 Views Right  Result Date: 01/05/2022 CLINICAL DATA:  01/07/2022, right knee pain, deformity EXAM: RIGHT KNEE - 1-2 VIEW COMPARISON:  None Available. FINDINGS: Frontal and cross-table lateral views of the right knee are obtained. There is lateral patellar dislocation. No evidence of displaced fracture. No joint effusion. Soft tissues are unremarkable. IMPRESSION: 1. Lateral patellar dislocation. No evidence of fracture. Repeat imaging after reduction recommended. Electronically Signed   By: Larey Seat M.D.   On: 01/05/2022 21:52    Procedures .Ortho Injury Treatment  Date/Time: 01/05/2022 11:53 PM  Performed by: 01/07/2022, MD Authorized by: Gilda Crease, MD   Consent:    Consent obtained:  Verbal   Consent given by:  Patient   Risks discussed:  Restricted joint movement, irreducible dislocation and recurrent dislocationInjury location: knee Location details: right knee Injury type: dislocation Dislocation type: lateral patellar Pre-procedure neurovascular assessment: neurovascularly intact Pre-procedure distal perfusion: normal Pre-procedure neurological function: normal Pre-procedure range of motion: reduced  Anesthesia: Local anesthesia used: no  Patient sedated: NoManipulation performed: yes Reduction method: Extension of knee with slight medial pressure on patella. Reduction successful: yes Immobilization: Knee immobilizer. Post-procedure neurovascular assessment: post-procedure neurovascularly intact Post-procedure distal perfusion: normal Post-procedure neurological function: normal Post-procedure range of motion: improved     {Document cardiac monitor, telemetry assessment procedure when  appropriate:1}  Medications Ordered in ED Medications  HYDROmorphone (DILAUDID) injection 1 mg (1 mg Intravenous Given 01/05/22 2340)  ondansetron (ZOFRAN) injection 4 mg (4 mg Intravenous Given 01/05/22 2340)    ED Course/ Medical Decision Making/ A&P                           Medical Decision Making Risk Prescription drug management.   Presents to the emergency department for evaluation of knee injury.  X-ray shows lateral patella dislocation without fracture.  This was reduced.  Immobilized with knee immobilizer, follow-up with orthopedics.  {Document critical care time when appropriate:1} {Document review of labs and clinical decision tools ie heart score, Chads2Vasc2 etc:1}  {Document your independent review of radiology images, and any outside records:1} {Document your discussion with family members, caretakers, and with consultants:1} {Document social determinants of health affecting pt's care:1} {Document your decision making why or why not admission, treatments were needed:1} Final Clinical Impression(s) / ED Diagnoses Final diagnoses:  Patellar dislocation, right, initial encounter    Rx / DC Orders ED Discharge Orders          Ordered    HYDROcodone-acetaminophen (NORCO/VICODIN) 5-325 MG tablet  Every 4 hours PRN        01/05/22 2356    Ambulatory referral to Orthopedic Surgery        01/05/22 2356

## 2022-01-06 ENCOUNTER — Emergency Department (HOSPITAL_COMMUNITY): Payer: Self-pay

## 2022-01-06 NOTE — ED Notes (Signed)
Prepack and dc papers given. Went over with family. Verbalized all understanding. Wheeled out to car and assisted into vehicle.

## 2022-01-12 ENCOUNTER — Ambulatory Visit (INDEPENDENT_AMBULATORY_CARE_PROVIDER_SITE_OTHER): Payer: Self-pay | Admitting: Orthopedic Surgery

## 2022-01-12 ENCOUNTER — Ambulatory Visit (HOSPITAL_COMMUNITY)
Admission: RE | Admit: 2022-01-12 | Discharge: 2022-01-12 | Disposition: A | Discharge status: Home / Self Care | Payer: Self-pay | Source: Ambulatory Visit | Attending: Orthopedic Surgery | Admitting: Orthopedic Surgery

## 2022-01-12 ENCOUNTER — Encounter: Payer: Self-pay | Admitting: Orthopedic Surgery

## 2022-01-12 VITALS — BP 123/82 | HR 92 | Ht 63.0 in

## 2022-01-12 DIAGNOSIS — M25561 Pain in right knee: Secondary | ICD-10-CM | POA: Insufficient documentation

## 2022-01-12 DIAGNOSIS — I824Z1 Acute embolism and thrombosis of unspecified deep veins of right distal lower extremity: Secondary | ICD-10-CM

## 2022-01-12 DIAGNOSIS — I82411 Acute embolism and thrombosis of right femoral vein: Secondary | ICD-10-CM

## 2022-01-12 DIAGNOSIS — M2201 Recurrent dislocation of patella, right knee: Secondary | ICD-10-CM

## 2022-01-12 MED ORDER — APIXABAN 5 MG PO TABS: 5.0000 mg | ORAL_TABLET | Freq: Two times a day (BID) | ORAL | 5 refills | Status: DC

## 2022-01-12 MED ORDER — IBUPROFEN 800 MG PO TABS: 800.0000 mg | ORAL_TABLET | Freq: Three times a day (TID) | ORAL | 1 refills | Status: DC | PRN

## 2022-01-12 MED ORDER — HYDROCODONE-ACETAMINOPHEN 5-325 MG PO TABS: 1.0000 | ORAL_TABLET | Freq: Three times a day (TID) | ORAL | 0 refills | Status: AC | PRN

## 2022-01-12 NOTE — Addendum Note (Signed)
Addended by: Fuller Canada E on: 01/12/2022 12:16 PM   Modules accepted: Orders

## 2022-01-12 NOTE — Progress Notes (Addendum)
Chief Complaint  Patient presents with   Knee Pain    Right, playing with child on playground had two steps left jumped down and fell and knee dislocated, but c/o lower back pain goes all the way down the leg pain so bad I feel like I'm going to faint, numbness and coldness in foot, but can move toes now wasn't able to before    History 36 year old female injured on July 17 went to the emergency department with a patellar dislocation of the right knee was a lateral dislocation it was reduced in the emergency room she was placed in a knee immobilizer started on ibuprofen and hydrocodone presents now in severe pain.  She never got the hydrocodone  She is Spanish-speaking have an interpreter with her  She complains of severe calf pain  BP 123/82   Pulse 92   Ht 5\' 3"  (1.6 m)   LMP 11/22/2021   BMI 31.36 kg/m   She is awake alert and oriented x3 mood and affect are noted for distress from pain in her right knee and calf  Neurovascular exam is intact  She has some ecchymosis in the skin near the anterior tibia  Swelling of the right knee  Exam was difficult because of pain  Range of motion was limited to 20 degrees  Lachman test could not perform posterior drawer test could not perform collateral ligaments were stable  She was tender completely around the knee and patella  Tenderness in the calf positive Homans' sign no compartment syndrome  Imaging   My interpretation of the imaging is as follows   first imaging dislocated patella lateral  Second image shows reduced patella   Treatment plan  Medication for pain ibuprofen and hydrocodone  Knee immobilizer for 2 more weeks  Ultrasound rule out DVT  If the DVT is negative continue immobilization weight-bear as tolerated ibuprofen hydrocodone  If the DVT is positive then we will start her on anticoagulant.  Addendum DVT femoral vein  IMPRESSION: Positive for DVT involving the distal right femoral vein,  popliteal vein, and calf veins.   These results will be called to the ordering clinician or representative by the Radiologist Assistant, and communication documented in the PACS or 01/22/2022.     Electronically Signed   By: [employer] D.O.   On: 01/12/2022 11:21  Meds ordered this encounter  Medications   HYDROcodone-acetaminophen (NORCO/VICODIN) 5-325 MG tablet    Sig: Take 1 tablet by mouth every 8 (eight) hours as needed for up to 5 days for moderate pain.    Dispense:  15 tablet    Refill:  0   ibuprofen (ADVIL) 800 MG tablet    Sig: Take 1 tablet (800 mg total) by mouth every 8 (eight) hours as needed.    Dispense:  90 tablet    Refill:  1   apixaban (ELIQUIS) 5 MG TABS tablet    Sig: Take 1 tablet (5 mg total) by mouth 2 (two) times daily.    Dispense:  60 tablet    Refill:  5

## 2022-01-13 ENCOUNTER — Telehealth: Payer: Self-pay

## 2022-01-13 NOTE — Telephone Encounter (Signed)
Patient had her daughter to translate for her and this is the concern. When she wears the brace it forms a ball below her knee where the yellow spot was. States she was told that she could take it off for sleep and shower, but she only uses it when she has to go to the bathroom, etc. She is asking for a return call at [phone] to discuss her concerns.

## 2022-01-14 NOTE — Telephone Encounter (Signed)
As I told her   She has to wear the brace final answer

## 2022-01-14 NOTE — Telephone Encounter (Signed)
I called her to advise. Left message for her to call me back.  

## 2022-01-14 NOTE — Telephone Encounter (Signed)
Patient called back and I relayed the message. She wants to know if she has to keep it on all the time? She is asking if she is laying down or sitting down does she have to keep it on. They think if she keeps it on all the time its hurting her.  Please call her and advise. [phone]

## 2022-01-15 NOTE — Telephone Encounter (Signed)
I spoke to her, and then her daughter, she is not wearing the brace but will put it back on.

## 2022-01-19 ENCOUNTER — Ambulatory Visit: Payer: Self-pay | Admitting: Orthopedic Surgery

## 2022-01-26 ENCOUNTER — Ambulatory Visit (INDEPENDENT_AMBULATORY_CARE_PROVIDER_SITE_OTHER): Payer: Self-pay | Admitting: Orthopedic Surgery

## 2022-01-26 ENCOUNTER — Telehealth: Payer: Self-pay | Admitting: Radiology

## 2022-01-26 ENCOUNTER — Encounter: Payer: Self-pay | Admitting: Orthopedic Surgery

## 2022-01-26 DIAGNOSIS — S83004D Unspecified dislocation of right patella, subsequent encounter: Secondary | ICD-10-CM

## 2022-01-26 DIAGNOSIS — I82411 Acute embolism and thrombosis of right femoral vein: Secondary | ICD-10-CM

## 2022-01-26 MED ORDER — WARFARIN SODIUM 2.5 MG PO TABS: ORAL_TABLET | ORAL | 0 refills | Status: DC

## 2022-01-26 NOTE — Telephone Encounter (Signed)
I called her to advise the cardiologist can see her tomorrow in [place] at 1pm after that they can see her in [place], for the Coumadin management I called her and spoke to her son. They voiced understanding

## 2022-01-26 NOTE — Progress Notes (Unsigned)
FOLLOW UP   Encounter Diagnoses  Name Primary?   Closed dislocation of right patella, subsequent encounter Yes   Acute deep vein thrombosis (DVT) of femoral vein of right lower extremity [facility])      Chief Complaint  Patient presents with   Knee Injury    RT// patella dislocation//DVT DOI 01/05/22   21 days post injury   Does not tolerate knee immob  NOTE:  Placed on eloquis  $600 out of pocket for 1 month   Changing to warfarin and INR will be followed at cardiology clinic   As for the knee   Brace off continue crutuches start ROM program    Meds ordered this encounter  Medications   warfarin (COUMADIN) 2.5 MG tablet    Sig: Take 2 today and then 1 daily    Dispense:  60 tablet    Refill:  0

## 2022-01-26 NOTE — Patient Instructions (Addendum)
Ok to stop brace  We will refer to cardiology for Coumadin management Coagulopata por warfarina Warfarin Coagulopathy La coagulopata por warfarina se refiere al sangrado que puede ocurrir como complicacin por el uso del medicamento warfarina. Esto puede ser potencialmente mortal. La warfarina es un diluyente sanguneo (anticoagulante). Los anticoagulantes previenen los cogulos de sangre peligrosos. La complicacin ms comn y ms grave de la warfarina es el sangrado. Durante el tratamiento con warfarina, deber realizarse peridicamente anlisis de sangre (tiempo de protrombina o [place]) para determinar el tiempo de Biomedical scientist. Los [employer] del anlisis del tiempo de protrombina ([place]) se informarn como el ndice internacional normalizado (IIN). El IIN le informa al mdico si es necesario modificar la dosis de warfarina. Cuanto ms [employer] tome a la Media planner, mayor ser el valor del IIN. El riesgo de coagulopata por warfarina aumenta a medida que aumenta el valor del IIN. Cules son las causas? Esta afeccin puede ser causada por lo siguiente: El consumo excesivo de warfarina (sobredosis). Afecciones mdicas subyacentes. Cambios en la dieta. Interacciones con medicamentos, suplementos o alcohol. Cules son los signos o sntomas? La coagulopata por warfarina puede causar sangrado de Corporate treasurer. Entre los sntomas, se pueden incluir los siguientes: Sangrado de las encas o sangrado nasal que no se detiene fcilmente. Sangre en la orina o en las heces. Las [employer] verse de color rojo brillante, oscuro, negras o alquitranadas, y la [place] puede verse rosada, roja o Child psychotherapist. Moretones inusuales o de fcil aparicin, o cortes que no dejan de Geophysicist/field seismologist en 10 minutos. Vomitar o Community education officer. Tener nuseas durante ms de 1 da. Rotura de vasos sanguneos en el ojo. Dolor abdominal o de espalda, con o sin moretones. Hemorragia vaginal inusual. Hinchazn o dolor en el sitio de la  inyeccin. Cicatrices en la piel debido a la muerte del tejido adiposo. Esto puede causar [employer] cintura, los muslos o las [place]. Esto es ms frecuente en las mujeres. Los sntomas tambin pueden incluir signos de accidente cerebrovascular, por ejemplo: Dolor de cabeza repentino e intenso. Debilidad o adormecimiento sbito de la cara, el brazo o la pierna, especialmente en un lado del cuerpo. Confusin sbita. Dificultad para hablar o entender el lenguaje. Dificultad sbita en la visin en uno o ambos ojos. Dificultad imprevista para caminar. Mareos. Prdida del equilibrio o de la coordinacin. Cmo se diagnostica? Esta afeccin se diagnostica despus de que el mdico le receta warfarina y realiza anlisis para determinar la coagulacin de la [place]. Se realizan anlisis del tiempo de protrombina (TP) para determinar la capacidad de coagulacin. Cmo se trata? Si tiene sangrado, puede recibir tratamiento con vitamina K. La vitamina K ayuda a que la [employer]. Tambin puede recibir plasma donado. El plasma es la parte lquida de la [place]. Contiene sustancias que ayudan a que la sangre [place]. Si tiene Government social research officer mortal, es posible que le administren otros medicamentos a travs de una va [place]. Siga estas instrucciones en su casa: Medicamentos Tome la Dispensing optician se lo haya indicado el mdico, a la [employer]. Esto lo ayuda a Personnel officer o los cogulos sanguneos que podran causar lesiones graves, dolor o discapacidad. Comunquese con el mdico si olvida u omite una dosis. No cambie ni tome dosis adicionales para compensar las dosis omitidas o las dosis extra que tom por accidente. Muchos medicamentos recetados, de venta libre y Clinical research associate, as como las vitaminas, las hierbas y los suplementos, pueden interferir con la warfarina. Hable con  el mdico o con el farmacutico antes de Corporate investment banker a tomar o de Dentist. Tal vez haya que ajustarle la dosis de warfarina. Comida y bebida Es importante mantener una dieta normal y equilibrada mientras se toma warfarina. Evite hacer cambios importantes en su dieta. Si planifica cambiar su dieta, hable con el mdico antes de hacer modificaciones. Su mdico puede recomendarle que trabaje con un nutricionista. La vitamina K puede reducir la eficacia de la warfarina. Esta vitamina se encuentra en muchos alimentos. Consuma una cantidad constante de alimentos que contienen vitamina K. Por ejemplo, puede decidir comer 2 alimentos que contengan vitamina K cada da. Comer la misma cantidad [employer] permite al mdico establecer la dosis correcta de warfarina. Pruebas  Asegrese de [employer] del TP al menos una vez cada 4 a 6 semanas mientras toma la warfarina. Pregntele al mdico cul es su rango ideal del IIN. Asegrese de saber [employer] de su rango ideal. Si los valores del IIN no estn dentro del rango ideal, el mdico puede ajustarle la dosis. Cmo prevenir hemorragias y lesiones Algunos medicamentos y suplementos comunes de venta libre pueden aumentar el riesgo de hemorragias mientras est tomando warfarina, por ejemplo: Aspirina. Antiinflamatorios no esteroideos (AINE), como ibuprofeno o naproxeno. Vitamina E. Aceites de pescado. Evite las situaciones que le puedan producir sangrado, de las siguientes maneras: Utilice un cepillo de dientes blando. Utilice un hilo dental encerado, en lugar del no encerado. Rasrese con afeitadora elctrica, no con navaja. Limite el uso de objetos afilados. Evite las [address] potencialmente peligrosas, como los deportes de [place]. Instrucciones generales Use un brazalete de alerta mdica o lleve una tarjeta con una lista de los medicamentos que toma. Asegrese de informar a todos los mdicos, incluido el dentista, que est tomando warfarina. Si tiene planes de Museum/gallery exhibitions officer o de  quedar embarazada mientras est tomando warfarina, hable con el mdico. Evite el alcohol, las drogas y los productos que contengan nicotina y tabaco. Si cambia la cantidad de nicotina, tabaco o alcohol que consume, informe al mdico. Acuda a todas las visitas de seguimiento, incluidas las visitas para los anlisis de laboratorio. Esto es importante. Comunquese con un mdico si: Omite una dosis. Toma una dosis de ms. Tiene planes de someterse a cualquier tipo de [place] o procedimiento. Es posible que deba dejar de tomar warfarina antes de la [place]. No puede tomar el medicamento debido a las nuseas, los vmitos o la diarrea. Realiza cualquier cambio importante en la dieta o tiene planes de hacerlo. Empieza a tomar o suspende cualquier medicamento de venta libre, medicamento recetado o suplemento alimenticio. Ardelle Anton, tiene planes de quedar embarazada o cree que podra estarlo. Las menstruaciones son ms abundantes de lo normal, o tiene sangrado vaginal fuera de lo normal. Tiene moretones fuera de lo comn. Pierde el apetito. Tiene fiebre. Tiene diarrea durante ms de 24 horas. Solicite ayuda de inmediato si: Le aparecen sntomas de una reaccin alrgica, por ejemplo: Hinchazn de los labios, la cara, la [place], la boca o Administrator. Erupcin cutnea. Picazn. Zonas de piel hinchadas, rojas y que producen picazn (ronchas). Dificultad para respirar. Opresin en el pecho. Tiene sntomas de un accidente cerebrovascular. "BE FAST" es una manera fcil de recordar las principales seales de advertencia de un accidente cerebrovascular: B: Balance (equilibrio). Los signos son mareos, dificultad repentina para caminar o prdida del equilibrio. E: Eyes (ojos). Los signos son problemas para ver o un cambio repentino en la visin. F: Face (rostro).  Los signos son debilidad repentina o adormecimiento del rostro, o el rostro o el prpado que se caen hacia un lado. A: Arms (brazos). Los  signos son debilidad o adormecimiento en un brazo. Esto sucede de repente y generalmente en un lado del cuerpo. S: Speech (habla). Los signos son dificultad para hablar, hablar arrastrando las palabras o dificultad para comprender lo que las Forensic scientist. T: Time (tiempo). Es tiempo de llamar al servicio de Sports administrator. Anote la hora a la que [employer] sntomas. Presenta otros signos de accidente cerebrovascular, como los siguientes: Dolor de cabeza sbito e intenso que no tiene causa aparente. Nuseas o vmitos. Convulsiones. Tiene signos o sntomas de un cogulo de sangre, por ejemplo, los siguientes: Dolor o hinchazn en la pierna o el brazo. Piel enrojecida o caliente al tacto en el brazo o la pierna. Falta de aire o dificultad para respirar. Dolor de pecho. Fiebre sin motivo. Tiene los siguientes sntomas: Una cada o un accidente, especialmente si se golpea la cabeza. Sangre en la orina. La orina puede verse rojiza, rosada o de color t. Sangre en las heces. Las heces pueden ser negras o de color rojo brillante. Hemorragia que no se detiene despus de ejercer presin en la zona durante 30 minutos. Dolor intenso en las articulaciones o la espalda. Dedos de los pies morados o [place]. Ulceraciones cutneas que no desaparecen. Vmitos o tos con sangre. La sangre puede ser de color rojo brillante o similar a los granos de caf. Estos sntomas pueden representar un problema grave que constituye Radio broadcast assistant. No espere a ver si los sntomas desaparecen. Solicite atencin mdica de inmediato. Comunquese con el servicio de emergencias de su localidad (911 en los [place]). No conduzca por sus propios medios [employer] hospital. Resumen Se debe controlar rigurosamente a la warfarina con anlisis de [place]. Es muy importante que concurra a todas las visitas del laboratorio y a las visitas de seguimiento con el mdico. Asegrese de Artist rango ideal del IIN y su dosis de  warfarina. Vigile la cantidad de vitamina K que consume a diario. Intente comer la misma cantidad [employer]. Use o lleve una identificacin que indique que toma warfarina. Tome la warfarina a la misma hora [employer]. Llame al mdico si omite una dosis o si toma una dosis de ms. No modifique usted mismo la dosis de warfarina. Conozca los signos y los sntomas de un cogulo de [place], una hemorragia y un accidente cerebrovascular. Sepa cundo solicitar ayuda mdica de Associate Professor. Esta informacin no tiene Theme park manager el consejo del mdico. Asegrese de hacerle al mdico cualquier pregunta que tenga. Document Revised: 09/26/2020 Document Reviewed: 09/26/2020 Elsevier Patient Education  2023 [employer].

## 2022-01-27 ENCOUNTER — Ambulatory Visit (INDEPENDENT_AMBULATORY_CARE_PROVIDER_SITE_OTHER): Payer: Self-pay | Admitting: Internal Medicine

## 2022-01-27 ENCOUNTER — Encounter: Payer: Self-pay | Admitting: Internal Medicine

## 2022-01-27 VITALS — BP 124/62 | HR 57 | Ht 62.0 in | Wt 178.0 lb

## 2022-01-27 DIAGNOSIS — Z79899 Other long term (current) drug therapy: Secondary | ICD-10-CM

## 2022-01-27 MED ORDER — APIXABAN 5 MG PO TABS: 5.0000 mg | ORAL_TABLET | Freq: Two times a day (BID) | ORAL | 3 refills | Status: DC

## 2022-01-27 NOTE — Progress Notes (Signed)
Cardiology Office Note   Date:  01/27/2022   ID:  [name], [name] 1985/10/16, MRN [id number]  PCP:  [facility]  Cardiologist:   Dietrich Pates, MD   Pt presents for anticoagulation monitoring      History of Present Illness: Kristine Adams is a 36 y.o. female with a history of R knee injury in July 2023   Seen in ED   After this, she was diagnosed with a  R LE DVT ( distal femoral, popliteal, cafl veins)  She is followed by Dr Romeo Apple (ortho)  Without insurance, cannot afford Eliquis on her own.   Referred for consideration of coumadin Rx.  The pt denies CP   Denies SOB    No dizziness      Diet:   Breakfast:    Cereal, milk Lunch:  Beans, tostadas, Dinner   [place], veggies      Drinks   Water   Coke       Current Meds  Medication Sig   apixaban (ELIQUIS) 5 MG TABS tablet Take 5 mg by mouth 2 (two) times daily.   HYDROcodone-acetaminophen (NORCO/VICODIN) 5-325 MG tablet Take 1 tablet by mouth every 6 (six) hours as needed for moderate pain.   ibuprofen (ADVIL) 800 MG tablet Take 800 mg by mouth every 8 (eight) hours as needed for moderate pain.     Allergies:   Patient has no known allergies.   Past Medical History:  Diagnosis Date   Anemia    Hyperlipidemia    No pertinent past medical history     Past Surgical History:  Procedure Laterality Date   BREAST LUMPECTOMY WITH RADIOACTIVE SEED LOCALIZATION Right 07/23/2020   Procedure: RIGHT BREAST LUMPECTOMYX 2  WITH RADIOACTIVE SEED LOCALIZATION;  Surgeon: Harriette Bouillon, MD;  Location: [facility];  Service: General;  Laterality: Right;   CESAREAN SECTION     CESAREAN SECTION WITH BILATERAL TUBAL LIGATION  07/11/2012   Procedure: CESAREAN SECTION WITH BILATERAL TUBAL LIGATION;  Surgeon: Tilda Burrow, MD;  Location: [facility];  Service: Obstetrics;  Laterality: Bilateral;  speaks spanish, interpreter needed   TUBAL LIGATION       Social History:  The patient   reports that she has never smoked. She has never used smokeless tobacco. She reports that she does not drink alcohol and does not use drugs.   Family History:  The patient's family history includes Diabetes in her mother.    ROS:  Please see the history of present illness. All other systems are reviewed and  Negative to the above problem except as noted.    PHYSICAL EXAM: VS:  BP 124/62   Pulse (!) 57   Ht 5\' 2"  (1.575 m)   Wt 178 lb (80.7 kg) Comment: Pt couldnt stand on scale-stated weight  LMP 11/22/2021   SpO2 99%   BMI 32.56 kg/m   GEN: Well nourished, well developed, in no acute distress  HEENT: normal  Neck: no JVD, carotid bruits Cardiac: RRR; no murmurs  No LE edema  Respiratory:  clear to auscultation bilaterally, GI: soft, nontender, nondistended, + BS  No hepatomegaly  MS:  R knee minimally swollen Moving all extremities   Skin: warm and dry, no rash Neuro:  Strength and sensation are intact Psych: euthymic mood, full affect   EKG:  EKG is not ordered today.   Lipid Panel No results found for: "CHOL", "TRIG", "HDL", "CHOLHDL", "VLDL", "LDLCALC", "LDLDIRECT"    Wt Readings  from Last 3 Encounters:  01/27/22 178 lb (80.7 kg)  01/05/22 177 lb 0.5 oz (80.3 kg)  07/28/21 177 lb (80.3 kg)      ASSESSMENT AND PLAN:  1  DVT  Pt with provoked DVT after R knee injury, immobility.    No prior hx of clotting       Plan to anticoagulate  for 3 to 6 months She has been on Eliquis for a few wks    Will see if she can continue this  Discuss with pharmacy       2  Hx HL  Will check lipids   Discussed diet   LImit sugars      Will check CBC, BMET and lipids      Follow up to be determined       Current medicines are reviewed at length with the patient today.  The patient does not have concerns regarding medicines.  Signed, Dietrich Pates, MD  01/27/2022 1:37 PM    [facility] [address] [place], [place], [place]  [address] Phone: [phone]; Fax:  [phone]

## 2022-01-27 NOTE — Patient Instructions (Signed)
Medication Instructions:  Eliquis 5 mg twice a day  *If you need a refill on your cardiac medications before your next appointment, please call your pharmacy*   Lab Work: LIPID, BMET, CBC  If you have labs (blood work) drawn today and your tests are completely normal, you will receive your results only by: MyChart Message (if you have MyChart) OR A paper copy in the mail If you have any lab test that is abnormal or we need to change your treatment, we will call you to review the results.   Testing/Procedures:    Follow-Up: At [facility], you and your health needs are our priority.  As part of our continuing mission to provide you with exceptional heart care, we have created designated Provider Care Teams.  These Care Teams include your primary Cardiologist (physician) and Advanced Practice Providers (APPs -  Physician Assistants and Nurse Practitioners) who all work together to provide you with the care you need, when you need it.  We recommend signing up for the patient portal called "MyChart".  Sign up information is provided on this After Visit Summary.  MyChart is used to connect with patients for Virtual Visits (Telemedicine).  Patients are able to view lab/test results, encounter notes, upcoming appointments, etc.  Non-urgent messages can be sent to your provider as well.   To learn more about what you can do with MyChart, go to [email or web address].    Your next appointment:  AS NEEDED  Important Information About Sugar

## 2022-01-28 ENCOUNTER — Telehealth: Payer: Self-pay

## 2022-01-28 LAB — LIPID PANEL
Chol/HDL Ratio: 5.7 ratio — ABNORMAL HIGH (ref 0.0–4.4)
Cholesterol, Total: 182 mg/dL (ref 100–199)
HDL: 32 mg/dL — ABNORMAL LOW (ref >39)
LDL Chol Calc (NIH): 113 mg/dL — ABNORMAL HIGH (ref 0–99)
Triglycerides: 212 mg/dL — ABNORMAL HIGH (ref 0–149)
VLDL Cholesterol Cal: 37 mg/dL (ref 5–40)

## 2022-01-28 LAB — CBC
Hematocrit: 35 % (ref 34.0–46.6)
Hemoglobin: 10.4 g/dL — ABNORMAL LOW (ref 11.1–15.9)
MCH: 19.8 pg — ABNORMAL LOW (ref 26.6–33.0)
MCHC: 29.7 g/dL — ABNORMAL LOW (ref 31.5–35.7)
MCV: 67 fL — ABNORMAL LOW (ref 79–97)
Platelets: 415 10*3/uL (ref 150–450)
RBC: 5.25 x10E6/uL (ref 3.77–5.28)
RDW: 21.7 % — ABNORMAL HIGH (ref 11.7–15.4)
WBC: 7.5 10*3/uL (ref 3.4–10.8)

## 2022-01-28 LAB — BASIC METABOLIC PANEL
BUN/Creatinine Ratio: 15 (ref 9–23)
BUN: 11 mg/dL (ref 6–20)
CO2: 18 mmol/L — ABNORMAL LOW (ref 20–29)
Calcium: 9.5 mg/dL (ref 8.7–10.2)
Chloride: 103 mmol/L (ref 96–106)
Creatinine, Ser: 0.73 mg/dL (ref 0.57–1.00)
Glucose: 82 mg/dL (ref 70–99)
Potassium: 4.2 mmol/L (ref 3.5–5.2)
Sodium: 138 mmol/L (ref 134–144)
eGFR: 109 mL/min/{1.73_m2} (ref >59)

## 2022-01-28 NOTE — Telephone Encounter (Signed)
**Note De-Identified Kristine Adams Obfuscation** I called [employer] and [employer] ID #: [phone] assisted me with this call.  The pt is aware too call [employer] at [phone] to ask questions about their Eliquis assistance program and her eligibility for approval. She is aware that if approved she will receive her Eliquis free of charge from them for the remainder of this year.  She is also aware to request that they mail her an application to her home address if she is eligible for approval and that when she receives it to complete her part of it, obtain required documents required per [employer]MSPAF, and to bring all to Dr Charlott Rakes office at [employer] at [address]. [address] in [place] to drop off and that we will take care of the provides page of her application and will fax all to [employer]MS[employer].  The pt verbalized understanding to all instructions given. All of her questions were answered to her satisfaction and she thanked me for calling her to explain the process.

## 2022-02-02 ENCOUNTER — Telehealth: Payer: Self-pay

## 2022-02-02 DIAGNOSIS — Z79899 Other long term (current) drug therapy: Secondary | ICD-10-CM

## 2022-02-02 NOTE — Telephone Encounter (Signed)
-----   Message from Pricilla Riffle, MD sent at 01/28/2022  8:18 AM EDT ----- Electrolytes and kidney functoin are normal Lipids    LDL is a little high      HDL is a little low      For now look at diet   We talked about eliminating sugar drinks, keep carbs low, lots of veggies  CBC :  Hgb is a little low     I would repeat in 2 months    (she is on Eliquis)  (Pt does not speak [place]   Children do and interpreter)

## 2022-02-02 NOTE — Telephone Encounter (Signed)
The patient's son has been notified of the result and verbalized understanding.  All questions (if any) were answered. Theresia Majors, RN 02/02/2022 3:22 PM

## 2022-02-11 ENCOUNTER — Telehealth: Payer: Self-pay | Admitting: Licensed Clinical Social Worker

## 2022-02-11 NOTE — Telephone Encounter (Signed)
H&V Care Navigation CSW Progress Note  Clinical Social Worker contacted patient by phone to f/u on recent visit. I was able to reach pt at [phone] with the assistance of [facility] #[id number]. Introduced self, role, reason for call. Pt confirmed that she is uninsured, she shares that she has received many papers from hospital. She was able to locate CAFA and we began to discuss additional assistance when the line disconnected. Interpreter and I attempted to reach pt again- unfortunately we were unable to reconnect with pt at this time. Will re-attempt as able.   Patient is participating in a Managed Medicaid Plan:  No, self pay only.  SDOH Screenings   Alcohol Screen: Not on file  Depression (GUY4-0): Not on file  Financial Resource Strain: Not on file  Food Insecurity: No Food Insecurity (01/07/2021)   Hunger Vital Sign    Worried About Running Out of Food in the Last Year: Never true    Ran Out of Food in the Last Year: Never true  Housing: Not on file  Physical Activity: Not on file  Social Connections: Not on file  Stress: Not on file  Tobacco Use: Low Risk  (01/27/2022)   Patient History    Smoking Tobacco Use: Never    Smokeless Tobacco Use: Never    Passive Exposure: Not on file  Transportation Needs: No Transportation Needs (01/07/2021)   PRAPARE - Transportation    Lack of Transportation (Medical): No    Lack of Transportation (Non-Medical): No   Octavio Graves, MSW, LCSW Clinical Social Worker II [facility] Heart/Vascular Care Navigation  [phone]- work cell phone (preferred) [phone]- desk phone

## 2022-02-16 ENCOUNTER — Ambulatory Visit (INDEPENDENT_AMBULATORY_CARE_PROVIDER_SITE_OTHER): Payer: Self-pay | Admitting: Orthopedic Surgery

## 2022-02-16 ENCOUNTER — Encounter: Payer: Self-pay | Admitting: Orthopedic Surgery

## 2022-02-16 DIAGNOSIS — G8929 Other chronic pain: Secondary | ICD-10-CM

## 2022-02-16 DIAGNOSIS — S83004D Unspecified dislocation of right patella, subsequent encounter: Secondary | ICD-10-CM

## 2022-02-16 NOTE — Patient Instructions (Addendum)
Dr Romeo Apple wants you to have  MRI please go ahead and call to schedule your appointment with [facility] within at least one (1) week.   Central Scheduling [phone]   Crutches   Ice 3 x a day

## 2022-02-16 NOTE — Progress Notes (Unsigned)
Chief Complaint  Patient presents with   Knee Pain    Right    FOLLOW UP        Encounter Diagnoses  Name Primary?   Closed dislocation of right patella, subsequent encounter Yes   Acute deep vein thrombosis (DVT) of femoral vein of right lower extremity [facility])      Follow-up visit       Chief Complaint  Patient presents with   Knee Injury      RT// patella dislocation//DVT DOI 01/05/22    This patient had a patellar dislocation complicated by deep vein thrombosis.  She is here with an interpreter.  She was placed on Eliquis to cost over $400.  She did get the medicine  I tried to change it to warfarin  She became confused and bought a second round of Eliquis so she is still on it  I told her that after this medication has been taken she can go to warfarin and go to the Coumadin clinic at a much cheaper cost  Today she still has swollen right knee although she can weight-bear with crutches she still cannot bend the knee well.  Reexamination of the knee feels as if she has ACL laxity limited flexion to 90  Current recommendation  Continue Eliquis switched to warfarin for period of 4 months total time on the warfarin plus to 2 months on Eliquis to give 6 months to cover the DVT  MRI to rule out ACL tear

## 2022-02-17 ENCOUNTER — Ambulatory Visit: Payer: Self-pay | Admitting: Adult Health

## 2022-03-04 ENCOUNTER — Ambulatory Visit (HOSPITAL_COMMUNITY)
Admission: RE | Admit: 2022-03-04 | Discharge: 2022-03-04 | Disposition: A | Discharge status: Home / Self Care | Payer: Self-pay | Source: Ambulatory Visit | Attending: Orthopedic Surgery | Admitting: Orthopedic Surgery

## 2022-03-04 DIAGNOSIS — S83004D Unspecified dislocation of right patella, subsequent encounter: Secondary | ICD-10-CM | POA: Insufficient documentation

## 2022-03-04 DIAGNOSIS — M25561 Pain in right knee: Secondary | ICD-10-CM | POA: Insufficient documentation

## 2022-03-04 DIAGNOSIS — G8929 Other chronic pain: Secondary | ICD-10-CM | POA: Insufficient documentation

## 2022-03-09 ENCOUNTER — Telehealth: Payer: Self-pay | Admitting: Licensed Clinical Social Worker

## 2022-03-09 NOTE — Telephone Encounter (Signed)
H&V Care Navigation CSW Progress Note  Clinical Social Worker contacted patient by phone to f/u on CAFA.  With assistance of [facility] #[id number] I called pt at [phone], no answer and voicemail full. Will re-attempt assessment for a third time as able.   Patient is participating in a Managed Medicaid Plan:  No, self pay only. Per notes pt had received CAFA and stated she would return to financial counseling at 03/13/2022.   SDOH Screenings   Food Insecurity: No Food Insecurity (01/07/2021)  Transportation Needs: No Transportation Needs (01/07/2021)  Tobacco Use: Low Risk  (02/16/2022)   Westley Hummer, MSW, [place]  [phone]- work cell phone (preferred) [phone]- desk phone

## 2022-03-12 ENCOUNTER — Telehealth: Payer: Self-pay | Admitting: Internal Medicine

## 2022-03-12 ENCOUNTER — Telehealth: Payer: Self-pay | Admitting: Radiology

## 2022-03-12 ENCOUNTER — Encounter: Payer: Self-pay | Admitting: Orthopedic Surgery

## 2022-03-12 ENCOUNTER — Ambulatory Visit (INDEPENDENT_AMBULATORY_CARE_PROVIDER_SITE_OTHER): Payer: Self-pay | Admitting: Orthopedic Surgery

## 2022-03-12 DIAGNOSIS — M25511 Pain in right shoulder: Secondary | ICD-10-CM

## 2022-03-12 DIAGNOSIS — M545 Low back pain, unspecified: Secondary | ICD-10-CM

## 2022-03-12 DIAGNOSIS — I82411 Acute embolism and thrombosis of right femoral vein: Secondary | ICD-10-CM

## 2022-03-12 DIAGNOSIS — S83231D Complex tear of medial meniscus, current injury, right knee, subsequent encounter: Secondary | ICD-10-CM

## 2022-03-12 NOTE — Telephone Encounter (Signed)
Received a phone call from Dr Aline Brochure (ortho)   He says patient cannot afford Eliquis  Thinks patient should do better on coumadin   Set to see Edrick Oh in coumadin clinci in [facility] translator I think   Confirm about coverage (Eliquis)   Looks like effort was made, that potentially patient could get Eliqius  ? If she did her part

## 2022-03-12 NOTE — Telephone Encounter (Signed)
FYI only we were told patient will not qualify for any patient assistance program through the company / Eliquis because she is not a citizen of the [place]  To you FYI only

## 2022-03-12 NOTE — Telephone Encounter (Signed)
Called and spoke to pt using the [phone]). Asked pt is she was able to fill out the paper work for [employer] patient assistance.   Pt stated that she saw Dr. Dorothyann Peng today and she is no longer on Eliquis but is going to start taking warfarin. He prescribed her warfarin 2.5mg  tablets (mint green color). Pt stated Dr. Laure Kidney told her to start taking warfarin tomorrow. Pt is taking her last dose of Eliquis tonight. Pt stated Dr. Dorothyann Peng told her to not take anymore Eliquis after tonight's dose and that she will need to come to a coumadin appointment at [facility] on Monday 03/16/2022 at 10:00 am.   Pt stated that she had bruising with Eliquis and informed her that like Eliquis, Warfarin is a blood thinner and she would be at a higher risk for bruising and bleeding.   Went over the following warfarin education with pt:  The need for frequent and regular monitoring, precise dosage adjustment and compliance is stressed.  Side effects of potential bleeding are discussed.  The patient should avoid any OTC items containing aspirin or ibuprofen, and should avoid great swings in general diet.  Avoid alcohol consumption.  Gave pt the number to the [facility] office and informed her that we would look forward to seeing her on Monday.

## 2022-03-12 NOTE — Patient Instructions (Addendum)
[facility] [place] [phone] //[address] [place]. Monday Sept 25th 945am Pregunte al mdico qu ejercicios son seguros para usted. Haga los ejercicios exactamente como se lo haya indicado el mdico y gradelos como se lo hayan indicado. Es normal sentir un leve estiramiento, tironeo, opresin o Tree surgeon al [employer] ejercicios. Detngase de inmediato si siente un dolor repentino o Printmaker. No comience a hacer estos ejercicios hasta que se lo indique el mdico. Ejercicio de elongacin y amplitud de movimiento Este ejercicio calienta los msculos y las articulaciones, y mejora la movilidad y la flexibilidad de la rodilla. Adems, el ejercicio ayuda a Best boy y la rigidez. Msculos isquiotibiales, estiramiento en la puerta Este es un ejercicio en el que debe recostarse frente a una puerta abierta y Theatre manager la pierna elevada contra una pared para estirar la parte posterior de la rodilla y el muslo (isquiotibiales). Recustese boca arriba frente a una puerta abierta, con la pierna izquierda/derecha apoyada contra la pared y la otra pierna estirada sobre el suelo, a travs de la [place]. La rodilla izquierda/derecha debe estar levemente flexionada. Extienda la rodilla izquierda/derecha. Debe sentir un estiramiento detrs de la rodilla o del muslo. Si no lo siente, acerque las nalgas a la puerta. Mantenga esta posicin durante _10_________ segundos. Repita _____10_____ veces. Realice este ejercicio ______0____ veces al da. Ejercicios de fortalecimiento Estos ejercicios fortalecen la rodilla y Building control surveyor resistencia. La resistencia es la capacidad de usar los msculos durante un tiempo prolongado, incluso despus de que se cansen. Cudriceps, ejercicio isomtrico En este ejercicio, se estiran los msculos de la parte delantera del muslo (cudriceps) sin mover la articulacin de la rodilla (isomtrico). Recustese boca arriba con la pierna izquierda/derecha  extendida y la rodilla opuesta flexionada. Tensione lentamente los msculos de la parte de adelante del muslo izquierdo/derecho. Al hacerlo, deber ver que la rtula se desliza hacia la cadera o que se profundizan los hoyuelos en la parte de arriba de la rodilla. Este movimiento [employer] parte de atrs de la rodilla hacia el suelo. Si le duele, coloque una toalla pequea enrollada debajo de la rodilla para brindar apoyo en una posicin flexionada. Cambie el tamao de la toalla para Pension scheme manager una posicin que le permita hacer este ejercicio sin Education officer, environmental. Mantenga el msculo tan apretado como pueda sin aumentar el dolor durante ___10_______ segundos. Relaje los msculos lentamente y por completo. Repita _____15____ veces. Realice este ejercicio _______3___ veces al da. Elevaciones de la pierna extendida, flexores Este ejercicio estira los msculos de la parte de adelante del muslo (cudriceps) y los msculos que permiten mover las caderas (flexores de cadera). Recustese boca arriba con la pierna izquierda/derecha extendida y la rodilla opuesta flexionada. Tensione los msculos de la parte de adelante del muslo izquierdo/derecho. Al hacerlo, deber ver que la rtula se desliza o que se profundizan los hoyuelos en la parte de arriba de la rodilla. Mantenga estos msculos contrados mientras levanta la pierna a una altura de 4 a 6 pulgadas (10 a 15 cm) del suelo. No flexione la rodilla que mover. Mantenga esta posicin durante ______10____ segundos. Con los msculos contrados, baje la pierna lentamente. Relaje los msculos lentamente y por completo. Repita ___10_______ veces. Realice este ejercicio __5________ veces al da. Cuclillas Este es un ejercicio con soporte de Physiological scientist en el que debe flexionar las rodillas y Sports coach la cadera mientras contrae los msculos del muslo. Prese enfrente de [employer], con los pies y las rodillas apuntando  hacia adelante. Puede colocar las [employer] la mesa para  mantener el equilibrio, pero no para apoyarse. Flexione lentamente las rodillas y baje la cadera, como si fuera a sentarse en una silla. Mantenga el [employer] talones, no [employer]vin[employer]itor dedos. Mantenga la parte inferior de las piernas rectas, de modo que queden paralelas a las patas de la mesa. No deje que la cadera quede ms abajo que las rodillas. No flexione las rodillas ms de lo indicado por el mdico. Si el dolor en las rodillas [place], no las flexione Equality. Mantenga la posicin de cuclillas durante _______3___ segundos. Haga fuerza con las piernas para pararse de [place]. No use las manos para pararse. Repita _____10_____ veces. Realice este ejercicio 0000000 veces al da. Con escaln Este es un ejercicio en el que debe bajar lentamente mientras contrae los msculos de las piernas. Prese sobre el borde de un escaln. Manteniendo el peso sobre el taln izquierdo/derecho, flexione lentamente la rodilla izquierda/derecha para llevar el taln izquierdo/derecho hacia el suelo. Baje el taln [employer], sin perder el control y sin que aumenten las Bertram. No deje que la rodilla izquierda/derecha se mueva hacia adelante. Use los msculos de la pierna, y no la gravedad para bajar el cuerpo. Sostngase de [place] pared o una baranda para mantener el equilibrio si lo necesita. Lentamente, haga fuerza con el taln para volver a elevar el cuerpo. Regrese a la posicin inicial. Repita ____10______ veces. Realice este ejercicio 0000000 veces al da. Elevaciones de la pierna extendida, abductores Este ejercicio [employer] msculos que rotan la pierna a la altura de la cadera y la alejan del cuerpo (abductores de la cadera). Recustese de costado con la pierna izquierda/derecha arriba. Recustese de [employer] cabeza, los hombros, la rodilla y la cadera estn alineados. Puede flexionar la rodilla de abajo para mantener el equilibrio. Mueva ligeramente la cadera hacia adelante, de modo  que quede alineada y que la rodilla izquierda/derecha quede hacia adelante. Con el taln como gua, levante la pierna de 4 a 6 pulgadas (10 a 15 cm). Debe sentir que se elevan los msculos de la parte externa de la cadera. No lleve el pie hacia adelante. No gire la rodilla hacia el techo. Mantenga esta posicin durante ______5____ segundos. Baje lentamente la pierna hasta la posicin inicial. Deje que los msculos se relajen completamente despus de cada repeticin. Repita ______10____ veces. Realice este ejercicio [id number] veces al da. Esta informacin no tiene Marine scientist el consejo del mdico. Asegrese de hacerle al mdico cualquier pregunta que tenga. Document Revised: 02/19/2021 Document Reviewed: 02/19/2021 Elsevier Patient Education  [place].

## 2022-03-12 NOTE — Progress Notes (Signed)
36 year old female follows up after MRI  Encounter Diagnoses  Name Primary?   Acute deep vein thrombosis (DVT) of femoral vein of right lower extremity (HCC) Yes   Complex tear of medial meniscus of right knee as current injury, subsequent encounter    Midline low back pain without sciatica, unspecified chronicity    Right shoulder pain, unspecified chronicity     In review she twisted her knee if she was thought to have patellar subluxation dislocation.  She developed a blood clot in a week  She was started on Eliquis and then we wanted to converted to Coumadin.  She is still on the Eliquis she has 1 more tablet left.  We called the cardiology clinic to see if we can get her on warfarin and be followed in [place] but she can only be followed in [place].  The Eliquis is too expensive the Coumadin is $10.  We will keep her on the Coumadin  Her main problem is that she is having stiffness in the right knee and trouble bending it.  Her pain and swelling generally have significantly improved.  The MRI shows she has a torn medial meniscus a sprained ACL possible medial ligament sprain as well  The knee actually feels stable  After much back-and-forth through the interpreter and calling the cardiologist we have decided to   1 keep her on Coumadin for the blood clot  2 see her in 2 months and reevaluate the knee for possible arthroscopic surgery  3 medication management stop ibuprofen, stop Eliquis after today, start warfarin.  Take Tylenol.  The patient also complains of back pain and shoulder pain since her fall.  She has no neurologic deficits upper or lower extremities.  I recommended she take hot showers and warm Epsom salt baths

## 2022-03-12 NOTE — Telephone Encounter (Signed)
Called and spoke to pt, who stated that she had 1 tablet left of Eliquis. She also had a bottle of of warfarin 2.5mg  that she was going to start once she is done with Eliquis.   Asked pt if she had applied for patient assistance. Pt stated to give her a call back in 5 minutes when she was with her son, who speaks better [place].   Attempted to call pt back and no answer.

## 2022-03-12 NOTE — Telephone Encounter (Signed)
**Note De-Identified Lebron Nauert Obfuscation** I had a long conversation with the pt on 8/9 concerning her applying for Eliquis assistance through [employer]MSPAF.  I answered all of her questions and I provided her with [employer]MS[employer]Fs phone number and advised her to call them with questions about her eligibility to be approved for assistance.   I have not received her application or any calls from her concerning Eliquis since that time.  Looks like the pt has been working with Westley Hummer, MSW, LCSW for assistance. I will include her in the message so she is aware.

## 2022-03-16 ENCOUNTER — Ambulatory Visit: Payer: Self-pay | Admitting: *Deleted

## 2022-03-16 ENCOUNTER — Encounter: Payer: Self-pay | Admitting: *Deleted

## 2022-03-16 ENCOUNTER — Ambulatory Visit: Payer: Self-pay | Attending: Cardiology | Admitting: *Deleted

## 2022-03-16 DIAGNOSIS — Z7901 Long term (current) use of anticoagulants: Secondary | ICD-10-CM

## 2022-03-16 DIAGNOSIS — I82411 Acute embolism and thrombosis of right femoral vein: Secondary | ICD-10-CM

## 2022-03-16 DIAGNOSIS — Z5181 Encounter for therapeutic drug level monitoring: Secondary | ICD-10-CM | POA: Insufficient documentation

## 2022-03-16 DIAGNOSIS — I82409 Acute embolism and thrombosis of unspecified deep veins of unspecified lower extremity: Secondary | ICD-10-CM | POA: Insufficient documentation

## 2022-03-16 LAB — POCT INR: INR: 1.5 — AB (ref 2.0–3.0)

## 2022-03-16 MED ORDER — WARFARIN SODIUM 2.5 MG PO TABS: ORAL_TABLET | ORAL | 0 refills | Status: DC

## 2022-03-16 NOTE — Progress Notes (Signed)
This encounter was created in error - please disregard.

## 2022-03-16 NOTE — Patient Instructions (Signed)
Description   Take 1.5 tablets of warfarin today and tomorrow, then continue to take warfarin 1 tablet daily. Recheck INR in 1 week.  A full discussion of the nature of anticoagulants has been carried out.  A benefit risk analysis has been presented to the patient, so that they understand the justification for choosing anticoagulation at this time. The need for frequent and regular monitoring, precise dosage adjustment and compliance is stressed.  Side effects of potential bleeding are discussed.  The patient should avoid any OTC items containing aspirin or ibuprofen, and should avoid great swings in general diet.  Avoid alcohol consumption.  Call if any signs of abnormal bleeding.

## 2022-03-20 ENCOUNTER — Ambulatory Visit: Payer: Self-pay | Attending: Internal Medicine | Admitting: *Deleted

## 2022-03-20 DIAGNOSIS — I82401 Acute embolism and thrombosis of unspecified deep veins of right lower extremity: Secondary | ICD-10-CM

## 2022-03-20 DIAGNOSIS — Z5181 Encounter for therapeutic drug level monitoring: Secondary | ICD-10-CM

## 2022-03-20 DIAGNOSIS — Z7901 Long term (current) use of anticoagulants: Secondary | ICD-10-CM

## 2022-03-20 LAB — POCT INR: INR: 3 (ref 2.0–3.0)

## 2022-03-20 NOTE — Patient Instructions (Signed)
Continue to take warfarin 1 tablet daily. Recheck INR in 1 week.

## 2022-03-25 ENCOUNTER — Ambulatory Visit: Payer: Self-pay | Attending: Internal Medicine | Admitting: *Deleted

## 2022-03-25 DIAGNOSIS — Z7901 Long term (current) use of anticoagulants: Secondary | ICD-10-CM

## 2022-03-25 DIAGNOSIS — I82401 Acute embolism and thrombosis of unspecified deep veins of right lower extremity: Secondary | ICD-10-CM

## 2022-03-25 LAB — POCT INR: INR: 2.1 (ref 2.0–3.0)

## 2022-03-25 NOTE — Patient Instructions (Signed)
Continue to take warfarin 1 tablet daily. Recheck INR in 2 weeks

## 2022-03-30 ENCOUNTER — Ambulatory Visit: Payer: Self-pay | Admitting: Adult Health

## 2022-04-08 ENCOUNTER — Ambulatory Visit: Payer: Self-pay | Attending: Internal Medicine | Admitting: *Deleted

## 2022-04-08 DIAGNOSIS — I82411 Acute embolism and thrombosis of right femoral vein: Secondary | ICD-10-CM

## 2022-04-08 DIAGNOSIS — Z5181 Encounter for therapeutic drug level monitoring: Secondary | ICD-10-CM

## 2022-04-08 DIAGNOSIS — Z7901 Long term (current) use of anticoagulants: Secondary | ICD-10-CM

## 2022-04-08 DIAGNOSIS — I82401 Acute embolism and thrombosis of unspecified deep veins of right lower extremity: Secondary | ICD-10-CM

## 2022-04-08 LAB — POCT INR: INR: 2.4 (ref 2.0–3.0)

## 2022-04-08 MED ORDER — WARFARIN SODIUM 2.5 MG PO TABS: ORAL_TABLET | ORAL | 3 refills | Status: DC

## 2022-04-08 NOTE — Patient Instructions (Signed)
Continue to take warfarin 1 tablet daily.  Recheck INR in 3 weeks

## 2022-04-30 ENCOUNTER — Ambulatory Visit: Payer: Self-pay | Attending: Internal Medicine | Admitting: *Deleted

## 2022-04-30 DIAGNOSIS — I82402 Acute embolism and thrombosis of unspecified deep veins of left lower extremity: Secondary | ICD-10-CM

## 2022-04-30 DIAGNOSIS — Z7901 Long term (current) use of anticoagulants: Secondary | ICD-10-CM

## 2022-04-30 LAB — POCT INR: INR: 2 (ref 2.0–3.0)

## 2022-04-30 NOTE — Patient Instructions (Signed)
Take warfarin 1 1/2 tablets tonight then continue 1 tablet daily.  Recheck INR in 4 weeks

## 2022-05-12 ENCOUNTER — Encounter: Payer: Self-pay | Admitting: Adult Health

## 2022-05-12 ENCOUNTER — Ambulatory Visit (INDEPENDENT_AMBULATORY_CARE_PROVIDER_SITE_OTHER): Payer: Self-pay | Admitting: Adult Health

## 2022-05-12 ENCOUNTER — Other Ambulatory Visit (HOSPITAL_COMMUNITY)
Admission: RE | Admit: 2022-05-12 | Discharge: 2022-05-12 | Disposition: A | Discharge status: Home / Self Care | Payer: Self-pay | Source: Ambulatory Visit | Attending: Adult Health | Admitting: Adult Health

## 2022-05-12 VITALS — BP 101/67 | HR 66 | Ht 62.0 in | Wt 185.0 lb

## 2022-05-12 DIAGNOSIS — N941 Unspecified dyspareunia: Secondary | ICD-10-CM

## 2022-05-12 DIAGNOSIS — Z01419 Encounter for gynecological examination (general) (routine) without abnormal findings: Secondary | ICD-10-CM

## 2022-05-12 DIAGNOSIS — M25511 Pain in right shoulder: Secondary | ICD-10-CM

## 2022-05-12 DIAGNOSIS — M25561 Pain in right knee: Secondary | ICD-10-CM

## 2022-05-12 DIAGNOSIS — Z86718 Personal history of other venous thrombosis and embolism: Secondary | ICD-10-CM

## 2022-05-12 NOTE — Progress Notes (Signed)
Patient ID: Kristine Adams, female   DOB: July 31, 1985, 36 y.o.   MRN: [id number] History of Present Illness: Kristine Adams is a 36 year old Hispanic female,married, [id number], in for a well woman gyn exam and pap. She feel in July and hurt her right knee and then had DVT she is on Coumadin 2.5 mg daily. She is complaining of low back pain and  right shoulder hurting. Had interpreter on phone.  PCP is [facility].   Current Medications, Allergies, Past Medical History, Past Surgical History, Family History and Social History were reviewed in [name].     Review of Systems: Patient denies any headaches, hearing loss, fatigue, blurred vision, shortness of breath, chest pain, abdominal pain, problems with bowel movements, urination, or intercourse(has pain with sex at times). No joint pain or mood swings.  See HPI for positives.   Physical Exam:BP 101/67 (BP Location: Left Arm, Patient Position: Sitting, Cuff Size: Normal)   Pulse 66   Ht 5\' 2"  (1.575 m)   Wt 185 lb (83.9 kg)   LMP 05/04/2022   BMI 33.84 kg/m   General:  Well developed, well nourished, no acute distress Skin:  Warm and dry Neck:  Midline trachea, normal thyroid, good ROM, no lymphadenopathy Lungs; Clear to auscultation bilaterally Breast:  No dominant palpable mass, retraction, or nipple discharge Cardiovascular: Regular rate and rhythm Abdomen:  Soft, non tender, no hepatosplenomegaly Pelvic:  External genitalia is normal in appearance, no lesions.  The vagina is normal in appearance. Urethra has no lesions or masses. The cervix is bulbous. Pap with GC/CHL and HR HPV genotyping performed. Uterus is felt to be normal size, shape, and contour.  No adnexal masses or tenderness noted.Bladder is non tender, no masses felt. Rectal: deferred Extremities/musculoskeletal:  No swelling or varicosities noted, no clubbing or cyanosis, can not raise right arm over head and decrease strength, has pop with  movement. Both calves tight, no redness or heat. Right knee is mildly swollen Psych:  No mood changes, alert and cooperative,seems happy AA is 0 Fall risk is moderate    05/12/2022    3:46 PM  Depression screen PHQ 2/9  Decreased Interest 0  Down, Depressed, Hopeless 0  PHQ - 2 Score 0  Altered sleeping 0  Tired, decreased energy 0  Change in appetite 0  Feeling bad or failure about yourself  0  Trouble concentrating 0  Moving slowly or fidgety/restless 0  Suicidal thoughts 0  PHQ-9 Score 0       05/12/2022    3:46 PM  GAD 7 : Generalized Anxiety Score  Nervous, Anxious, on Edge 0  Control/stop worrying 0  Worry too much - different things 0  Trouble relaxing 0  Restless 0  Easily annoyed or irritable 0  Afraid - awful might happen 0  Total GAD 7 Score 0      Upstream - 05/12/22 1505       Pregnancy Intention Screening   Does the patient want to become pregnant in the next year? No    Does the patient's partner want to become pregnant in the next year? No    Would the patient like to discuss contraceptive options today? No      Contraception Wrap Up   Current Method Female Sterilization    End Method Female Sterilization            Examination chaperoned by 05/14/22 LPN   Impression and Plan: 1. Encounter for gynecological examination with  Papanicolaou smear of cervix Pap sent  Pap in 3 years if normal Physical in 1 year   2. History of DVT of lower extremity On coumadin 2.5 mg 1 daily   3. Right shoulder pain, unspecified chronicity Can't raise over her head and decreased strength Tell Dr Romeo Apple on 05/20/22  4. Right knee pain, unspecified chronicity Pain and swelling right knee  Has follow up with Dr Romeo Apple 05/20/22  5. Dyspareunia, female Try good lubricate like astroglide, try different positions

## 2022-05-15 LAB — CYTOLOGY - PAP
Chlamydia: NEGATIVE
Comment: NEGATIVE
Comment: NEGATIVE
Comment: NORMAL
Diagnosis: NEGATIVE
High risk HPV: NEGATIVE
Neisseria Gonorrhea: NEGATIVE

## 2022-05-20 ENCOUNTER — Encounter: Payer: Self-pay | Admitting: Orthopedic Surgery

## 2022-05-20 ENCOUNTER — Ambulatory Visit (INDEPENDENT_AMBULATORY_CARE_PROVIDER_SITE_OTHER): Payer: Self-pay | Admitting: Orthopedic Surgery

## 2022-05-20 ENCOUNTER — Ambulatory Visit (INDEPENDENT_AMBULATORY_CARE_PROVIDER_SITE_OTHER): Payer: Self-pay

## 2022-05-20 DIAGNOSIS — M545 Low back pain, unspecified: Secondary | ICD-10-CM

## 2022-05-20 DIAGNOSIS — M25511 Pain in right shoulder: Secondary | ICD-10-CM

## 2022-05-20 DIAGNOSIS — S83511D Sprain of anterior cruciate ligament of right knee, subsequent encounter: Secondary | ICD-10-CM

## 2022-05-20 DIAGNOSIS — S83231D Complex tear of medial meniscus, current injury, right knee, subsequent encounter: Secondary | ICD-10-CM

## 2022-05-20 DIAGNOSIS — G8929 Other chronic pain: Secondary | ICD-10-CM

## 2022-05-20 DIAGNOSIS — I82411 Acute embolism and thrombosis of right femoral vein: Secondary | ICD-10-CM

## 2022-05-20 MED ORDER — METHOCARBAMOL 500 MG PO TABS: 500.0000 mg | ORAL_TABLET | Freq: Three times a day (TID) | ORAL | 1 refills | Status: DC

## 2022-05-20 MED ORDER — PREDNISONE 10 MG (48) PO TBPK: ORAL_TABLET | Freq: Every day | ORAL | 1 refills | Status: DC

## 2022-05-20 NOTE — Progress Notes (Signed)
Chief Complaint  Patient presents with   Knee Pain    Right/ states improving but walks with knee in extension    36 year old female injured in July developed DVT was treated initially with Eliquis and then warfarin is now continuing on Coumadin  Reevaluation right knee  Patient complains of persistent shoulder and back pain since her fall back in July  Right shoulder painful crepitance superior angle of the scapula and posterior lateral deltoid and shoulder without loss of motion but painful forward elevation  Lumbar spine bilateral back pain without leg pain or radiculopathy no bowel or bladder dysfunction  Right knee range of motion has improved patient is still apprehensive about bearing full weight  Reexamination right knee Her range of motion has improved to 125 degrees of flexion she is actually hyperextended when compared to the left knee both legs/knees hyperextend about 10 degrees  Now that her pain and swelling have subsided her range of motion has returned better Lachman test was done today and it showed a 2+ Lachman test  This was painful for her   She has tenderness over the right scapula  Pain and crepitance with range of motion of the right shoulder but no evidence of weakness  Imaging of the right shoulder showed no fracture or dislocation no evidence of subluxation  Imaging of the lumbar spine shows a scoliosis with no fracture dislocation or spondylolisthesis or lysis   Moving forward we discussed possible injections for the trigger point as well as the rotator cuff syndrome but because of the cost we opted for prednisone  She will also get some Robaxin for the back pain  She will follow-up in January to schedule surgery for February 6 months after the DVT  MRI findings: IMPRESSION: 1.  Radial tear of the posterior horn of the lateral meniscus.   2. Thickening/partial-thickness tear of the medial collateral ligament,, likely a chronic process.   2.  Edema and thickening of the anterior cruciate ligament without evidence of discrete tear suggesting chronic ligamentous sprain.   3.  Moderate knee joint effusion.   4.  No evidence of fracture or osteonecrosis.     Electronically Signed   By: Larose Hires D.O.   On: 03/05/2022 10:35  Through the interpreter I explained to her that she has a strongly positive Lachman test on exam although her MRI shows only possible tear  This would best be evaluated by exam under anesthesia and arthroscopic evaluation.  If the ligament is indeed incompetent then ligament reconstruction would be in order.  I would choose allograft for her  We can address the meniscal tear as well  Of course we will have to stop the Coumadin prior to surgery Meds ordered this encounter  Medications   DISCONTD: methocarbamol (ROBAXIN) 500 MG tablet    Sig: Take 1 tablet (500 mg total) by mouth 3 (three) times daily.    Dispense:  60 tablet    Refill:  1   predniSONE (STERAPRED UNI-PAK 48 TAB) 10 MG (48) TBPK tablet    Sig: Take by mouth daily. 10 mg 12 days as directed    Dispense:  48 tablet    Refill:  1   methocarbamol (ROBAXIN) 500 MG tablet    Sig: Take 1 tablet (500 mg total) by mouth 3 (three) times daily.    Dispense:  60 tablet    Refill:  1    Xrays shoulder back

## 2022-05-28 ENCOUNTER — Ambulatory Visit: Payer: Self-pay | Attending: Internal Medicine | Admitting: *Deleted

## 2022-05-28 DIAGNOSIS — Z86718 Personal history of other venous thrombosis and embolism: Secondary | ICD-10-CM

## 2022-05-28 DIAGNOSIS — Z7901 Long term (current) use of anticoagulants: Secondary | ICD-10-CM

## 2022-05-28 LAB — POCT INR: INR: 2.3 (ref 2.0–3.0)

## 2022-05-28 NOTE — Patient Instructions (Signed)
Continue warfarin 1 tablet daily  Recheck INR in 4 weeks.  

## 2022-06-25 ENCOUNTER — Ambulatory Visit: Payer: Self-pay | Attending: Internal Medicine | Admitting: *Deleted

## 2022-06-25 DIAGNOSIS — Z86718 Personal history of other venous thrombosis and embolism: Secondary | ICD-10-CM

## 2022-06-25 DIAGNOSIS — Z7901 Long term (current) use of anticoagulants: Secondary | ICD-10-CM

## 2022-06-25 LAB — POCT INR: INR: 1.6 — AB (ref 2.0–3.0)

## 2022-06-25 NOTE — Patient Instructions (Signed)
Take warfarin 2 tablets tonight, take 1 1/2 tablets tomorrow night then resume 1 tablet daily.  Recheck INR in 2 weeks

## 2022-07-09 ENCOUNTER — Ambulatory Visit: Payer: Self-pay | Attending: Internal Medicine | Admitting: *Deleted

## 2022-07-09 DIAGNOSIS — Z86718 Personal history of other venous thrombosis and embolism: Secondary | ICD-10-CM

## 2022-07-09 DIAGNOSIS — Z7901 Long term (current) use of anticoagulants: Secondary | ICD-10-CM

## 2022-07-09 LAB — POCT INR: INR: 1.8 — AB (ref 2.0–3.0)

## 2022-07-09 NOTE — Patient Instructions (Signed)
Increase warfarin to 1 tablet daily except 1 1/2 tablets on Sundays and Thursdays  Recheck INR in 2 weeks

## 2022-07-15 ENCOUNTER — Encounter: Payer: Self-pay | Admitting: Orthopedic Surgery

## 2022-07-15 ENCOUNTER — Ambulatory Visit (INDEPENDENT_AMBULATORY_CARE_PROVIDER_SITE_OTHER): Payer: Self-pay | Admitting: Orthopedic Surgery

## 2022-07-15 DIAGNOSIS — Z01818 Encounter for other preprocedural examination: Secondary | ICD-10-CM

## 2022-07-15 DIAGNOSIS — S83231D Complex tear of medial meniscus, current injury, right knee, subsequent encounter: Secondary | ICD-10-CM

## 2022-07-15 DIAGNOSIS — S83511D Sprain of anterior cruciate ligament of right knee, subsequent encounter: Secondary | ICD-10-CM

## 2022-07-15 NOTE — Patient Instructions (Signed)
Your surgery will be at [facility] by Dr Harrison  The hospital will contact you with a preoperative appointment to discuss Anesthesia.  Please arrive on time or 15 minutes early for the preoperative appointment, they have a very tight schedule if you are late or do not come in your surgery will be cancelled.  The phone number is [phone]. Please bring your medications with you for the appointment. They will tell you the arrival time and medication instructions when you have your preoperative evaluation. Do not wear nail polish the day of your surgery and if you take Phentermine you need to stop this medication ONE WEEK prior to your surgery. If you take Invokana, Farxiga, Jardiance, or Steglatro) - Hold 72 hours before the procedure.  If you take Ozempic,  Bydureon or Trulicity do not take for 8 days before your surgery. If you take Victoza, Rybelsis, Saxenda or Adlyxi stop 24 hours before the procedure.  Please arrive at the hospital 2 hours before procedure if scheduled at 9:30 or later in the day or at the time the nurse tells you at your preoperative visit.   If you have my chart do not use the time given in my chart use the time given to you by the nurse during your preoperative visit.   Your surgery  time may change. Please be available for phone calls the day of your surgery and the day before. The Short Stay department may need to discuss changes about your surgery time. Not reaching the you could lead to procedure delays and possible cancellation.  You must have a ride home and someone to stay with you for 24 to 48 hours. The person taking you home will receive and sign for the your discharge instructions.  Please be prepared to give your support person's name and telephone number to Central Registration. Dr Harrison will need that name and phone number post procedure.   

## 2022-07-15 NOTE — Progress Notes (Signed)
Chief Complaint  Patient presents with   Knee Pain    Right     This is a 37 year old female who injured her knee approximately 6 months ago.  She was thought to have had a patellar dislocation  However after regaining her motion and reexamination it was felt that she may have an ACL tear  She was sent for an MRI that showed a possible ACL tear and medial meniscus tear MCL sprain as well.  She is on warfarin because she developed a DVT after the initial knee injury  She is followed at the warfarin clinic  She needs Spanish interpreter who is here today to help [place] with communication  Reexamination of the knee reveals she is regained full range of motion there is tenderness over the medial joint line with some effusion she has a positive anterior drawer test as well  There is mild laxity of the medial collateral ligament at 30 degrees flexion  Plan is for her to stop Coumadin on 13 March in preparation for surgery on 19 March  The plan is to  1 doing an exam under anesthesia 2 perform arthroscopic evaluation of the joint medial meniscectomy and then 3 possible quadriceps tendon graft ACL reconstruction

## 2022-07-23 ENCOUNTER — Ambulatory Visit: Payer: Self-pay | Attending: Internal Medicine | Admitting: *Deleted

## 2022-07-23 DIAGNOSIS — Z86718 Personal history of other venous thrombosis and embolism: Secondary | ICD-10-CM

## 2022-07-23 DIAGNOSIS — Z7901 Long term (current) use of anticoagulants: Secondary | ICD-10-CM

## 2022-07-23 LAB — POCT INR: INR: 2.1 (ref 2.0–3.0)

## 2022-07-23 NOTE — Patient Instructions (Signed)
Continue warfarin 1 tablet daily except 1 1/2 tablets on Sundays and Thursdays  Recheck INR in 2 weeks

## 2022-08-06 ENCOUNTER — Ambulatory Visit: Payer: Self-pay | Attending: Internal Medicine | Admitting: *Deleted

## 2022-08-06 DIAGNOSIS — Z7901 Long term (current) use of anticoagulants: Secondary | ICD-10-CM

## 2022-08-06 DIAGNOSIS — Z86718 Personal history of other venous thrombosis and embolism: Secondary | ICD-10-CM

## 2022-08-06 LAB — POCT INR: INR: 1.5 — AB (ref 2.0–3.0)

## 2022-08-06 NOTE — Patient Instructions (Signed)
Take warfarin 2 tablets tonight and tomorrow night then continue 1 tablet daily except 1 1/2 tablets on Sundays and Thursdays  Recheck INR in 2 weeks

## 2022-08-20 ENCOUNTER — Ambulatory Visit: Payer: Self-pay | Attending: Internal Medicine | Admitting: *Deleted

## 2022-08-20 ENCOUNTER — Telehealth: Payer: Self-pay | Admitting: *Deleted

## 2022-08-20 DIAGNOSIS — Z7901 Long term (current) use of anticoagulants: Secondary | ICD-10-CM

## 2022-08-20 DIAGNOSIS — I82401 Acute embolism and thrombosis of unspecified deep veins of right lower extremity: Secondary | ICD-10-CM

## 2022-08-20 LAB — POCT INR: POC INR: 3.1

## 2022-08-20 NOTE — Telephone Encounter (Signed)
Pt is scheduled to have Knee Arthroscopy 09/08/2022 by Dr. Aline Brochure.   Pt is on Warfarin- hx of a R LE DVT   Per Dr. Ruthe Mannan Note from 07/15/2022: Plan is for her to stop Coumadin on 13 March in preparation for surgery on 19 March

## 2022-08-20 NOTE — Patient Instructions (Signed)
Description   Take 1/2 a tablet today and then continue 1 tablet daily except 1 1/2 tablets on Sundays and Thursdays  Recheck INR in 2 weeks

## 2022-08-21 NOTE — Telephone Encounter (Signed)
Patient with diagnosis of DVT on warfarin for anticoagulation.    Procedure: knee arthroscopy Date of procedure: 09/08/22   CrCl 138 Platelet count 415  Patient has history of provoked DVT 12/2021 after knee injury and immobilization.  Was to be on anticoagulation for 6 months post event.    Will review with Dr. Harrington Challenger if patient should be bridged and continue warfarin after procedure for a short time.    **This guidance is not considered finalized until pre-operative APP has relayed final recommendations.**

## 2022-08-24 NOTE — Telephone Encounter (Signed)
With provoked event should be able to come off of anticoagulation after 6 months   She should be ok to stop Needs to mobilize after arthroscopy

## 2022-08-25 NOTE — Telephone Encounter (Signed)
Please advise pt she can stop her warfarin permanently and that I have canceled her 3/14 INR check. Med list updated.

## 2022-08-25 NOTE — Telephone Encounter (Signed)
FAX # [phone]. I WILL FAX NOTES  PER PRE OP APP.

## 2022-08-25 NOTE — Telephone Encounter (Signed)
     Primary Cardiologist: Dorris Carnes, MD  Chart reviewed as part of pre-operative protocol coverage. Given past medical history and time since last visit, based on ACC/AHA guidelines, Kristine Adams would be at acceptable risk for the planned procedure without further cardiovascular testing.   Patient with diagnosis of DVT on warfarin for anticoagulation.     Procedure: knee arthroscopy Date of procedure: 09/08/22     CrCl 138 Platelet count 415   Patient has history of provoked DVT 12/2021 after knee injury and immobilization.  Was to be on anticoagulation for 6 months post event.     Patient may stop Coumadin on 09/02/2022.  She will not need to resume therapy.  I will route this recommendation to the requesting party via Epic fax function and remove from pre-op pool.  Please call with questions.  Jossie Ng. Olivette Beckmann NP-C     08/25/2022, [address] Office [phone] Fax [phone]

## 2022-08-28 NOTE — Telephone Encounter (Signed)
Please make pt aware she does not need to come in for INR check on 2/14, this appt has been canceled since she will be permanently stopping her warfarin.

## 2022-08-28 NOTE — Telephone Encounter (Signed)
I s/w the pt today through [facility] # [phone]. Pt has been made aware she has been cleared and to stop coumadin 09/02/22. Per Pharm-d Megan Supple RPH-CPP pt will permanently will stop coumadin 09/02/22. Pt tells me that she only has 2 pills left and does she need a refill to carry her through 09/02/22. I assured the pt that I will ask our Pharm-D for recommendations and we will call her back. Pt said thank you.   I will fax notes to surgeon office.

## 2022-08-28 NOTE — Telephone Encounter (Addendum)
No she doesn't need a refill, she can stop her warfarin when she runs out in the next few days since she's already completed > 6 months of treatment after her DVT.

## 2022-08-28 NOTE — Telephone Encounter (Signed)
I called the pt to let her know that she can just finish the coumadin that she has and the then stop. The pt asked if I could hold on. Sounded like she clicked the phone over and then the phone hung up. I tried to call her back but no answer and vm is full.    I did re-try the pt again and was able to reach the pt and she has ben made aware to finish the supply of coumadin that she has then stop. Pt said her last dose then will be Saturday or Sunday. I said that is fine. Pt said thank you for the help and the call.

## 2022-09-01 NOTE — Patient Instructions (Addendum)
Instrucciones Para Antes de la Ciruga   Su ciruga est programada para-(your procedure is scheduled on)  09/08/2022 at [facility] - (enter)    Por favor llame al [phone] si tiene algn problema en la maana de la ciruga. (please call if you have any problems the morning of surgery.)                  Recuerde: (Remember)   No coma alimentos ni tome lquidos, incluyendo agua, despus de la medianoche del on 09/07/2022. (Do not eat food or drink liquids including water after midnight on   [employer] en la maana de la ciruga con un SORBITO de agua (take these meds the morning of surgery with a SIP of water)                                            Robaxin    Puede cepillarse los dientes en la maana de la ciruga. (you may brush your teeth the morning of surgery)   No use joyas, maquillaje de ojos, lpiz labial, crema para el cuerpo o esmalte de uas oscuro. (Do not wear jewelry, eye makeup, lipstick, body lotion, or dark fingernail polish)   No puede usar desodorante. (you may wear deodorant)   Si va a ser ingresado despues de la ciruga, deje la maleta en el carro hasta que se le haya asignado una habitacin. (If you are to be admitted after surgery, leave suitcase in car until your room has been assigned.)   A los pacientes que se les d de alta el mismo da no se les permitir manejar a casa.  (Patients discharged on the day of surgery will not be allowed to drive home)   Use ropa suelta y cmoda de regreso a casa. (wear loose comfortable clothes for ride home)     Reconstruccin del ligamento cruzado anterior, cuidados posteriores Anterior Cruciate Ligament Reconstruction, Care After Esta hoja le brinda informacin sobre cmo cuidarse despus del procedimiento. El mdico tambin podr darle instrucciones ms especficas. Comunquese con el mdico si tiene problemas o preguntas. Qu puedo  esperar despus del procedimiento? Despus del procedimiento, es normal tener los siguientes sntomas: Molestias. Hinchazn. Dolor. Una pequea cantidad de lquido proveniente de la incisin. Siga estas instrucciones en su casa: Control del dolor, la rigidez y la hinchazn  Si se lo indican, aplique hielo sobre la zona afectada. Para hacer esto: Si [place] un dispositivo ortopdico desmontable, quteselo segn las indicaciones del mdico. Ponga el hielo en una bolsa plstica. Coloque una toalla entre la piel y Therapist, nutritional. Aplique el hielo durante 20 minutos, 2 o 3 veces por da. Retire el hielo si la piel se pone de color rojo brillante. Esto es [employer]. Si no puede sentir dolor, calor o fro, tiene un mayor riesgo de que se dae la zona. Mueva los dedos del pie con frecuencia para reducir la rigidez y la hinchazn. Cuando est sentado o acostado, levante (eleve) la zona afectada por encima del nivel del corazn. Plum  instrucciones del mdico acerca del cuidado de las incisiones. Asegrese de hacer lo siguiente: Lvese las manos con agua y jabn durante al menos 20 segundos antes y despus de cambiarse la venda (vendaje). Use desinfectante para manos si no dispone de [place] y [place]. Cambie el vendaje como se lo haya indicado el mdico. No retire los puntos (suturas), la goma para cerrar la piel o las tiras [place]. Es posible que estos cierres cutneos deban quedar puestos en la piel durante 2 semanas o ms tiempo. Si los bordes de las tiras adhesivas empiezan a despegarse y Therapist, sports, puede recortar los que estn sueltos. No retire las tiras [facility] por completo a menos que el mdico se lo indique. Psychiatric nurse de la incisin todos los das para detectar signos de infeccin. Est atento a los siguientes signos: Enrojecimiento. Ms hinchazn o dolor. Mayor presencia de [place] o lquido. Calor. Pus o mal olor. Baarse No tome baos de inmersin, no  nade ni use el jacuzzi hasta que el mdico lo autorice. Pregntele al mdico si puede ducharse. [place] Pounds solo le permitan darse baos de [place]. Mantenga el vendaje seco hasta que el mdico le diga que se lo puede quitar. Si tiene un dispositivo ortopdico: selo como se lo haya indicado el mdico. Quteselo solamente como se lo haya indicado el mdico. Afljelo si los dedos de los pies se le adormecen, siente hormigueos o se le enfran y se tornan de Optician, dispensing. Mantenga limpio el dispositivo ortopdico. Si el dispositivo ortopdico no es impermeable: No deje que se moje. Cbralo con un envoltorio hermtico cuando tome un bao de inmersin o una ducha. Medicamentos Use los medicamentos de venta libre y los recetados solamente como se lo haya indicado el mdico. Pregntele al mdico si el medicamento recetado: Hace necesario que evite conducir o usar [place]. Puede causarle estreimiento. Es posible que tenga que tomar estas medidas para prevenir o tratar el estreimiento: Electronics engineer suficiente lquido como para Theatre manager la orina de color amarillo plido. Usar medicamentos recetados o de Radio broadcast assistant. Consumir alimentos ricos en fibra, como frijoles, cereales integrales, y frutas y verduras frescas. Limitar el consumo de alimentos ricos en grasa y azcares procesados, como los alimentos fritos o dulces. Actividad Retome sus actividades normales segn lo indicado por el mdico. Pregntele al mdico qu actividades son seguras para usted. Si le indicaron fisioterapia, haga los ejercicios nicamente como se lo hayan indicado. Hacer ejercicios que fortalezcan la rodilla puede mejorar la movilidad y la fuerza de la rodilla. Conducir No conduzca hasta que el mdico se lo autorice. Pregntele al mdico cundo puede volver a conducir si tiene un dispositivo ortopdico en la pierna. Indicaciones generales No use la pierna lesionada para sostener el peso del cuerpo hasta que el mdico lo autorice. Use  las [employer] se lo haya indicado el mdico. No consuma ningn producto que contenga nicotina o tabaco, como cigarrillos, cigarrillos electrnicos y tabaco de Higher education careers adviser. Estos pueden retrasar la recuperacin de la incisin o el hueso. Si necesita ayuda para dejar de consumir estos productos, consulte al [employer]. Cumpla con todas las visitas de seguimiento. Esto es importante. Comunquese con un mdico si: Tiene fiebre o escalofros. Tiene enrojecimiento alrededor de una incisin. Tiene ms hinchazn o dolor alrededor de una incisin. Le sale sangre o ms lquido de una incisin. La zona de la incisin se siente caliente al tacto. Tiene pus o percibe que sale mal olor del lugar de la incisin. Alguna de las incisiones se abre luego de  retirarle [place] grapas. Solicite ayuda de inmediato si: Siente dolor intenso que no se alivia con medicamentos. Tiene dificultad para respirar o Risk manager. Siente dolor en el pecho. Tiene dolor o hinchazn en la pantorrilla o en la parte posterior de la rodilla. Estos sntomas pueden representar un problema grave que constituye Engineer, maintenance (IT). No espere a ver si los sntomas desaparecen. Solicite atencin mdica de inmediato. Comunquese con el servicio de emergencias de su localidad (911 en los [place]). No conduzca por sus propios medios [employer]. Resumen Despus del procedimiento, es frecuente tener molestias, hinchazn y Social research officer, government. Use los medicamentos de venta libre y los recetados solamente como se lo haya indicado el mdico. No use la pierna lesionada para sostener el peso del cuerpo hasta que el mdico lo autorice. Use las muletas como se lo haya indicado el mdico. Comunquese con un mdico si tiene cualquier signo de infeccin en la zona de la incisin. Esta informacin no tiene Marine scientist el consejo del mdico. Asegrese de hacerle al mdico cualquier pregunta que tenga. Document Revised: 12/22/2019 Document  Reviewed: 12/22/2019 Elsevier Patient Education  [place] artroscpica del ligamento de la rodilla, cuidados posteriores Arthroscopic Knee Ligament Repair, Care After Esta hoja le brinda informacin sobre cmo cuidarse despus del procedimiento. Su mdico tambin podr darle instrucciones ms especficas. Comunquese con el mdico si tiene problemas o preguntas. Qu puedo esperar despus del procedimiento? Despus del procedimiento, es normal tener los siguientes sntomas: Molestias o dolor en la rodilla. Hematomas e hinchazn de la rodilla, la pantorrilla y el tobillo durante 3 a 4 das. Las incisiones segregan una pequea cantidad de lquido. Siga estas instrucciones en su casa: Medicamentos Use los medicamentos de venta libre y los recetados solamente como se lo haya indicado el mdico. Pregntele al mdico si el medicamento recetado: Hace necesario que evite conducir o usar [place]. Puede causarle estreimiento. Es posible que tenga que tomar estas medidas para prevenir o tratar el estreimiento: Electronics engineer suficiente lquido como para Theatre manager la orina de color amarillo plido. Usar medicamentos recetados o de Radio broadcast assistant. Consumir alimentos ricos en fibra, como frijoles, cereales integrales, y frutas y verduras frescas. Limitar el consumo de alimentos ricos en grasa y azcares procesados, como los alimentos fritos o dulces. Si tiene un dispositivo ortopdico o un inmovilizador: selos como se lo haya indicado el mdico. Quteselos solamente como se lo haya indicado el mdico. Afljelos si siente hormigueo en los dedos de los pies, si se le entumecen o se le enfran y se tornan de Optician, dispensing. Mantngalos limpios y secos. Pregntele al mdico cundo es seguro volver a Forensic psychologist. Baarse No tome baos de inmersin, no nade ni use el jacuzzi hasta que el mdico lo autorice. [place] (vendaje) secas hasta que el mdico le diga que se lo puede quitar. Si  el dispositivo ortopdico o el inmovilizador no son impermeables: No deje que se mojen. Cbralos con un envoltorio hermtico cuando tome un bao de inmersin o una ducha. Cuidado de la incisin  Siga las instrucciones del mdico acerca del cuidado de las incisiones. Asegrese de hacer lo siguiente: Lvese las manos con agua y jabn durante al menos 20 segundos antes y despus de [place] manager el vendaje. Use desinfectante para manos si no dispone de [place] y [place]. Cambie el vendaje como se lo haya indicado el mdico. No retire los puntos (suturas), la goma para cerrar la piel o las tiras [place]. Es posible  que estos cierres cutneos deban quedar puestos en la piel durante 2 semanas o ms tiempo. Si los bordes de las tiras adhesivas empiezan a despegarse y Therapist, sports, puede recortar los que estn sueltos. No retire las tiras [facility] por completo a menos que el mdico se lo indique. Psychiatric nurse de la incisin todos los das para detectar signos de infeccin. Est atento a los siguientes signos: Enrojecimiento. Mayor [place]. Mayor presencia de [place] o lquido. Calor. Pus o mal olor. Control del dolor, la rigidez y la hinchazn  Si se lo indican, aplique hielo sobre la zona afectada. Para hacer esto: Si tiene un dispositivo ortopdico o un inmovilizador desmontable, quteselos como se lo haya indicado el mdico. Ponga el hielo en una bolsa plstica. Coloque una toalla entre la piel y Therapist, nutritional. Aplique el hielo durante 20 minutos, 2 o 3 veces por da. Retire el hielo si la piel se pone de color rojo brillante. Esto es [employer]. Si no puede sentir dolor, calor o fro, tiene un mayor riesgo de que se dae la zona. Mueva los dedos del pie con frecuencia para reducir la rigidez y la hinchazn. Cuando est sentado o acostado, alce (eleve) la zona de la lesin por encima del nivel del corazn. Actividad No apoye el peso del cuerpo en la rodilla hasta que el mdico lo autorice.  Use muletas u otros dispositivos como se lo haya indicado el mdico. Haga los ejercicios de fisioterapia como se lo haya indicado el mdico. La fisioterapia puede ayudarlo a recuperar el movimiento y la fuerza de la rodilla. Siga las instrucciones del mdico acerca de lo siguiente: Cundo puede comenzar a Fish farm manager de amplitud de movimiento. Cundo puede comenzar a Geographical information systems officer fija y Field seismologist otras actividades de bajo impacto. Cundo puede comenzar a trotar y Field seismologist otras actividades de alto impacto. No levante ningn objeto que pese ms de 10 libras (4.5 kg) o que supere el lmite de peso que le hayan indicado, Nurse, children's que el mdico le diga que puede [place]. Pregntele al mdico qu actividades son seguras para usted. Indicaciones generales No consuma ningn producto que contenga nicotina o tabaco, como cigarrillos, cigarrillos electrnicos y tabaco de Higher education careers adviser. Estos pueden retrasar la recuperacin. Si necesita ayuda para dejar de consumir estos productos, consulte al [employer]. Use medias de compresin como se lo haya indicado su mdico. Estas medias ayudan a evitar la formacin de cogulos de Biochemist, clinical y a reducir la hinchazn de las piernas. Cumpla con todas las visitas de seguimiento. Esto es importante. Comunquese con un mdico si: Tiene cualquiera de estos signos de infeccin: Enrojecimiento alrededor de una incisin. Sangre o ms lquido de Audiological scientist de una incisin. Calor proveniente de una incisin. Pus o mal olor provenientes de la incisin. Ms hinchazn o dolor en la rodilla. Fiebre o escalofros. Su dolor no se alivia con medicamentos. La incisin se abre. Solicite ayuda de inmediato si: Tiene dificultad para respirar. Siente dolor en el pecho. Tiene ms dolor o hinchazn en la pantorrilla o en la parte posterior de la rodilla. Tiene adormecimiento y hormigueo cerca de la articulacin de la rodilla o en el pie, el tobillo o los dedos del pie. Nota que el pie o los dedos de  los pies tienen un aspecto ms oscuro de lo normal o estn ms fros de lo normal. Estos sntomas pueden representar un problema grave que constituye Engineer, maintenance (IT). No espere a ver si los sntomas desaparecen. Solicite atencin mdica de inmediato. Comunquese con  el servicio de emergencias de su localidad (911 en los [place]). No conduzca por sus propios medios [employer] hospital. Resumen Despus del procedimiento, es comn sentir dolor en la rodilla y tener hematomas e hinchazn en la rodilla, la pantorrilla y el tobillo. Poner hielo en la rodilla y Community education officer la pierna sobre el nivel del corazn lo ayudar a Financial controller y la hinchazn. Haga los ejercicios de fisioterapia como se lo haya indicado el mdico. La fisioterapia puede ayudarlo a recuperar el movimiento y la fuerza de la rodilla. Esta informacin no tiene Marine scientist el consejo del mdico. Asegrese de hacerle al mdico cualquier pregunta que tenga. Document Revised: 12/29/2019 Document Reviewed: 12/29/2019 Elsevier Patient Education  [place] general en adultos, cuidados posteriores General Anesthesia, Adult, Care After La siguiente informacin ofrece orientacin sobre cmo cuidarse despus del procedimiento. El mdico tambin podr darle instrucciones ms especficas. Comunquese con el mdico si tiene problemas o preguntas. Qu puedo esperar despus del procedimiento? Despus del procedimiento, es normal sentir o tener lo siguiente: Dolor o Chemical engineer de la va intravenosa (i.v.). Nuseas o vmitos. Dolor de Investment banker, operational o ronquera. Dificultad para concentrarse. Fro o escalofros. Debilidad, somnolencia o cansancio (fatiga). Malestar y Hydrologist. Estos sntomas pueden afectar partes del cuerpo que no estuvieron involucradas en la ciruga. Siga estas indicaciones en su casa: Durante el perodo de [employer] le haya indicado el mdico:  Descanse. No participe en actividades  que impliquen posibles cadas o lesiones. No conduzca ni opere maquinaria. No beba alcohol. No tome somnferos ni medicamentos que causen somnolencia. No tome decisiones trascendentes ni firme documentos importantes. No cuide a nios por su cuenta. Indicaciones generales [place] suficiente lquido como para mantener la orina de color amarillo plido. Si tiene apnea del sueo, la [place] y ciertos medicamentos pueden elevar su riesgo de tener problemas respiratorios. Siga las instrucciones del mdico respecto al uso del dispositivo para dormir: Siempre que duerma, [place] siestas que tome en [employer]. Mientras tome analgsicos recetados, medicamentos para dormir o medicamentos que producen somnolencia. Retome sus actividades normales segn lo indicado por el mdico. Pregntele al mdico qu actividades son seguras para usted. Use los medicamentos de venta libre y los recetados solamente como se lo haya indicado el mdico. No consuma ningn producto que contenga nicotina o tabaco. Estos productos incluyen cigarrillos, tabaco para Higher education careers adviser y aparatos de vapeo, como los Psychologist, sport and exercise. Estos pueden retrasar la cicatrizacin de la incisin despus de la ciruga. Si necesita ayuda para dejar de consumir esos productos, consulte al mdico. Comunquese con un mdico si: Tiene nuseas o vmitos que no mejoran con medicamentos. Vomita cada vez que come o bebe. Su dolor no se alivia con medicamentos. No puede orinar o ve [employer]. Tiene una erupcin cutnea. Tiene fiebre. Solicite ayuda de inmediato si: Tiene dificultad para respirar. Siente dolor en el pecho. Vomita sangre. Estos sntomas pueden Sales executive. Solicite ayuda de inmediato. Llame al 911. No espere a ver si los sntomas desaparecen. No conduzca por sus propios medios [employer]. Resumen Despus del procedimiento, es comn tener dolor de garganta, ronquera, nuseas, vmitos o sentir debilidad,  somnolencia o fatiga. Durante el perodo de tiempo que le haya indicado el mdico, no conduzca ni use [place]. [place] ayuda de inmediato si le cuesta respirar, siente dolor en el pecho o vomita sangre. Estos sntomas pueden Sales executive. Esta informacin no tiene Marine scientist el consejo del  mdico. Asegrese de hacerle al mdico cualquier pregunta que tenga. Document Revised: 10/02/2021 Document Reviewed: 10/02/2021 Elsevier Patient Education  [place].

## 2022-09-03 ENCOUNTER — Encounter (HOSPITAL_COMMUNITY)
Admission: RE | Admit: 2022-09-03 | Discharge: 2022-09-03 | Disposition: A | Discharge status: Home / Self Care | Payer: Self-pay | Source: Ambulatory Visit | Attending: Orthopedic Surgery | Admitting: Orthopedic Surgery

## 2022-09-03 ENCOUNTER — Other Ambulatory Visit (HOSPITAL_COMMUNITY): Payer: Self-pay

## 2022-09-03 VITALS — BP 105/69 | HR 73 | Temp 98.6°F | Resp 18 | Ht 62.0 in | Wt 185.0 lb

## 2022-09-03 DIAGNOSIS — Z86718 Personal history of other venous thrombosis and embolism: Secondary | ICD-10-CM | POA: Insufficient documentation

## 2022-09-03 DIAGNOSIS — S83511D Sprain of anterior cruciate ligament of right knee, subsequent encounter: Secondary | ICD-10-CM | POA: Insufficient documentation

## 2022-09-03 DIAGNOSIS — X58XXXD Exposure to other specified factors, subsequent encounter: Secondary | ICD-10-CM | POA: Insufficient documentation

## 2022-09-03 DIAGNOSIS — Z01818 Encounter for other preprocedural examination: Secondary | ICD-10-CM | POA: Insufficient documentation

## 2022-09-03 DIAGNOSIS — S83231D Complex tear of medial meniscus, current injury, right knee, subsequent encounter: Secondary | ICD-10-CM | POA: Insufficient documentation

## 2022-09-03 LAB — CBC WITH DIFFERENTIAL/PLATELET
Abs Immature Granulocytes: 0.01 10*3/uL (ref 0.00–0.07)
Basophils Absolute: 0 10*3/uL (ref 0.0–0.1)
Basophils Relative: 1 %
Eosinophils Absolute: 0 10*3/uL (ref 0.0–0.5)
Eosinophils Relative: 1 %
HCT: 30.9 % — ABNORMAL LOW (ref 36.0–46.0)
Hemoglobin: 9 g/dL — ABNORMAL LOW (ref 12.0–15.0)
Immature Granulocytes: 0 %
Lymphocytes Relative: 59 %
Lymphs Abs: 2.2 10*3/uL (ref 0.7–4.0)
MCH: 18.9 pg — ABNORMAL LOW (ref 26.0–34.0)
MCHC: 29.1 g/dL — ABNORMAL LOW (ref 30.0–36.0)
MCV: 65.1 fL — ABNORMAL LOW (ref 80.0–100.0)
Monocytes Absolute: 0.3 10*3/uL (ref 0.1–1.0)
Monocytes Relative: 9 %
Neutro Abs: 1.1 10*3/uL — ABNORMAL LOW (ref 1.7–7.7)
Neutrophils Relative %: 30 %
Platelets: 305 10*3/uL (ref 150–400)
RBC: 4.75 MIL/uL (ref 3.87–5.11)
RDW: 17.7 % — ABNORMAL HIGH (ref 11.5–15.5)
WBC: 3.8 10*3/uL — ABNORMAL LOW (ref 4.0–10.5)
nRBC: 0 % (ref 0.0–0.2)

## 2022-09-03 LAB — BASIC METABOLIC PANEL
Anion gap: 9 (ref 5–15)
BUN: 12 mg/dL (ref 6–20)
CO2: 22 mmol/L (ref 22–32)
Calcium: 7.9 mg/dL — ABNORMAL LOW (ref 8.9–10.3)
Chloride: 104 mmol/L (ref 98–111)
Creatinine, Ser: 0.77 mg/dL (ref 0.44–1.00)
GFR, Estimated: >60 mL/min (ref >60)
Glucose, Bld: 97 mg/dL (ref 70–99)
Potassium: 3.2 mmol/L — ABNORMAL LOW (ref 3.5–5.1)
Sodium: 135 mmol/L (ref 135–145)

## 2022-09-03 LAB — POCT PREGNANCY, URINE: Preg Test, Ur: NEGATIVE

## 2022-09-03 NOTE — [facility] Notes (Signed)
Spanish interpreter, Becky Sax in person during pre admission testing and translated patient's obtain consent, pre and post op instructions.

## 2022-09-07 NOTE — H&P (Signed)
Kristine Adams is an 37 y.o. female.   Chief Complaint: Right knee pain HPI: 37 year old female injured her right knee many months ago she was initially thought to have a patellar subluxation dislocation but further workup revealed and a medial meniscal tear and ACL sprain.  Unfortunately she developed a DVT and had to be treated with anticoagulation.  She continued to have issues with the right knee in terms of pain and giving way.  Based on those symptoms and MRI findings she presents now for arthroscopic evaluation exam under anesthesia meniscus and/or cruciate ligament surgery on the right knee  Past Medical History:  Diagnosis Date   Anemia    Hyperlipidemia    No pertinent past medical history   Deep vein thrombosis requiring anticoagulation  Past Surgical History:  Procedure Laterality Date   BREAST LUMPECTOMY WITH RADIOACTIVE SEED LOCALIZATION Right 07/23/2020   Procedure: RIGHT BREAST LUMPECTOMYX 2  WITH RADIOACTIVE SEED LOCALIZATION;  Surgeon: Erroll Luna, MD;  Location: [facility];  Service: General;  Laterality: Right;   CESAREAN SECTION     CESAREAN SECTION WITH BILATERAL TUBAL LIGATION  07/11/2012   Procedure: CESAREAN SECTION WITH BILATERAL TUBAL LIGATION;  Surgeon: Jonnie Kind, MD;  Location: [facility];  Service: Obstetrics;  Laterality: Bilateral;  speaks spanish, interpreter needed   TUBAL LIGATION      Family History  Problem Relation Age of Onset   Diabetes Mother    Social History:  reports that she has never smoked. She has never used smokeless tobacco. She reports that she does not drink alcohol and does not use drugs.  Allergies: No Known Allergies  No medications prior to admission.    No results found for this or any previous visit (from the past 48 hour(s)). No results found.  Review of Systems currently not lying shortness of breath chest pain or fever no dizziness or lightheadedness despite hemoglobin of 9  There were no  vitals taken for this visit. Physical Exam Vitals and nursing note reviewed.  Constitutional:      General: She is not in acute distress.    Appearance: Normal appearance. She is normal weight. She is not ill-appearing, toxic-appearing or diaphoretic.  HENT:     Head: Normocephalic and atraumatic.     Right Ear: External ear normal.     Left Ear: External ear normal.     Nose: Nose normal. No congestion or rhinorrhea.     Mouth/Throat:     Mouth: Mucous membranes are moist.     Pharynx: No oropharyngeal exudate or posterior oropharyngeal erythema.  Eyes:     General: No scleral icterus.       Right eye: No discharge.        Left eye: No discharge.     Extraocular Movements: Extraocular movements intact.     Conjunctiva/sclera: Conjunctivae normal.     Pupils: Pupils are equal, round, and reactive to light.  Cardiovascular:     Rate and Rhythm: Normal rate and regular rhythm.     Heart sounds: Normal heart sounds.  Pulmonary:     Effort: Pulmonary effort is normal. No respiratory distress.     Breath sounds: Normal breath sounds. No stridor.  Chest:     Chest wall: No tenderness.  Abdominal:     General: Abdomen is flat. Bowel sounds are normal. There is no distension.     Palpations: Abdomen is soft. There is no mass.     Tenderness: There is no abdominal  tenderness.  Musculoskeletal:     Cervical back: Normal range of motion and neck supple. No rigidity or tenderness.     Comments: Right knee exam, She regained full range of motion there appears to be a positive anterior drawer sign but her exam is equivocal and may require exam under anesthesia.  I did not detect a real pivot she had some mild collateral ligament laxity at 30 degrees flexion  We will really need to do a real good physical exam under anesthesia and arthroscopic evaluation and then determine if this 37 year old female needs to undergo full ACL reconstruction  Lymphadenopathy:     Cervical: No cervical  adenopathy.  Skin:    General: Skin is warm and dry.     Capillary Refill: Capillary refill takes less than 2 seconds.  Neurological:     General: No focal deficit present.     Mental Status: She is alert and oriented to person, place, and time.     Cranial Nerves: No cranial nerve deficit.     Sensory: No sensory deficit.     Motor: No weakness.     Deep Tendon Reflexes: Reflexes normal.  Psychiatric:        Mood and Affect: Mood normal.        Behavior: Behavior normal.        Thought Content: Thought content normal.        Judgment: Judgment normal.      Assessment/Plan EXAM: MRI OF THE RIGHT KNEE WITHOUT CONTRAST   TECHNIQUE: Multiplanar, multisequence MR imaging of the right was performed. No intravenous contrast was administered.   COMPARISON:  Radiographs dated January 06, 2022 views of the ladder physical apical alignment disc shaft   FINDINGS: MENISCI   Medial: Intact.   Lateral: Radial tear of the posterior horn of the lateral meniscus.   LIGAMENTS   Cruciates: PCL is intact. Thickening and edema of the ACL without evidence of tear suggesting ligamentous sprain.   Collaterals: Thickening and edema of the medial collateral ligament suggesting partial-thickness tear, likely chronic process. Lateral collateral ligament complex is intact.   CARTILAGE   Patellofemoral:  No chondral defect.   Medial: Mild partial-thickness cartilage loss of the medial femorotibial compartment.   Lateral:  No chondral defect.   JOINT: Moderate joint effusion. Normal Hoffa's fat-pad. No plical thickening.   POPLITEAL FOSSA: Popliteus tendon is intact. No Baker's cyst.   EXTENSOR MECHANISM: Intact quadriceps tendon. Mild patellar tendinosis without tear. Intact lateral patellar retinaculum. Intact medial patellar retinaculum. Intact MPFL.   BONES: No aggressive osseous lesion. No fracture or dislocation.   Other: No fluid collection or hematoma. Muscles are normal.    IMPRESSION: 1.  Radial tear of the posterior horn of the lateral meniscus.   2. Thickening/partial-thickness tear of the medial collateral ligament,, likely a chronic process.   2. Edema and thickening of the anterior cruciate ligament without evidence of discrete tear suggesting chronic ligamentous sprain.   3.  Moderate knee joint effusion.   4.  No evidence of fracture or osteonecrosis.     Electronically Signed   By: Keane Police D.O.   On: 03/05/2022 10:35    Right knee lateral meniscus tear ACL sprain MCL sprain  Arthroscopy right knee exam under anesthesia possible ACL reconstruction right knee  Arther Abbott, MD 09/07/2022, 12:31 PM

## 2022-09-08 ENCOUNTER — Ambulatory Visit (HOSPITAL_BASED_OUTPATIENT_CLINIC_OR_DEPARTMENT_OTHER): Payer: Self-pay | Admitting: Certified Registered"

## 2022-09-08 ENCOUNTER — Other Ambulatory Visit: Payer: Self-pay

## 2022-09-08 ENCOUNTER — Encounter (HOSPITAL_COMMUNITY): Payer: Self-pay | Admitting: Orthopedic Surgery

## 2022-09-08 ENCOUNTER — Telehealth: Payer: Self-pay | Admitting: Orthopedic Surgery

## 2022-09-08 ENCOUNTER — Encounter (HOSPITAL_COMMUNITY)
Admission: RE | Disposition: A | Discharge status: Home / Self Care | Payer: Self-pay | Source: Home / Self Care | Attending: Orthopedic Surgery

## 2022-09-08 ENCOUNTER — Ambulatory Visit (HOSPITAL_COMMUNITY): Payer: Self-pay | Admitting: Certified Registered"

## 2022-09-08 ENCOUNTER — Ambulatory Visit (HOSPITAL_COMMUNITY)
Admission: RE | Admit: 2022-09-08 | Discharge: 2022-09-08 | Disposition: A | Discharge status: Home / Self Care | Payer: Self-pay | Attending: Orthopedic Surgery | Admitting: Orthopedic Surgery

## 2022-09-08 DIAGNOSIS — S83511D Sprain of anterior cruciate ligament of right knee, subsequent encounter: Secondary | ICD-10-CM

## 2022-09-08 DIAGNOSIS — S83511A Sprain of anterior cruciate ligament of right knee, initial encounter: Secondary | ICD-10-CM | POA: Insufficient documentation

## 2022-09-08 DIAGNOSIS — X58XXXA Exposure to other specified factors, initial encounter: Secondary | ICD-10-CM | POA: Insufficient documentation

## 2022-09-08 DIAGNOSIS — Z86718 Personal history of other venous thrombosis and embolism: Secondary | ICD-10-CM | POA: Insufficient documentation

## 2022-09-08 DIAGNOSIS — S83281A Other tear of lateral meniscus, current injury, right knee, initial encounter: Secondary | ICD-10-CM | POA: Insufficient documentation

## 2022-09-08 DIAGNOSIS — M25561 Pain in right knee: Secondary | ICD-10-CM

## 2022-09-08 HISTORY — PX: KNEE ARTHROSCOPY WITH MEDIAL MENISECTOMY: SHX5651

## 2022-09-08 HISTORY — PX: ANTERIOR CRUCIATE LIGAMENT REPAIR: SHX115

## 2022-09-08 SURGERY — ARTHROSCOPY, KNEE, WITH MEDIAL MENISCECTOMY
Anesthesia: General | Site: Knee | Laterality: Right

## 2022-09-08 MED ORDER — EPINEPHRINE PF 1 MG/ML IJ SOLN
INTRAMUSCULAR | Status: AC
  Filled 2022-09-08: qty 8

## 2022-09-08 MED ORDER — ASPIRIN 325 MG PO TBEC: 325.0000 mg | DELAYED_RELEASE_TABLET | Freq: Every day | ORAL | 3 refills | Status: AC

## 2022-09-08 MED ORDER — SODIUM CHLORIDE 0.9 % IR SOLN
Status: DC | PRN
  Administered 2022-09-08 (×5): 3000 mL

## 2022-09-08 MED ORDER — DEXAMETHASONE SODIUM PHOSPHATE 10 MG/ML IJ SOLN
INTRAMUSCULAR | Status: DC | PRN
  Administered 2022-09-08: 10 mg via INTRAVENOUS

## 2022-09-08 MED ORDER — PROPOFOL 10 MG/ML IV BOLUS
INTRAVENOUS | Status: AC
  Filled 2022-09-08: qty 20

## 2022-09-08 MED ORDER — MIDAZOLAM HCL 2 MG/2ML IJ SOLN
INTRAMUSCULAR | Status: AC
  Filled 2022-09-08: qty 2

## 2022-09-08 MED ORDER — PHENYLEPHRINE 80 MCG/ML (10ML) SYRINGE FOR IV PUSH (FOR BLOOD PRESSURE SUPPORT)
PREFILLED_SYRINGE | INTRAVENOUS | Status: DC | PRN
  Administered 2022-09-08: 160 ug via INTRAVENOUS

## 2022-09-08 MED ORDER — LIDOCAINE HCL (PF) 2 % IJ SOLN
INTRAMUSCULAR | Status: AC
  Filled 2022-09-08: qty 5

## 2022-09-08 MED ORDER — ONDANSETRON HCL 4 MG/2ML IJ SOLN
INTRAMUSCULAR | Status: DC | PRN
  Administered 2022-09-08: 4 mg via INTRAVENOUS

## 2022-09-08 MED ORDER — MIDAZOLAM HCL 5 MG/5ML IJ SOLN
INTRAMUSCULAR | Status: DC | PRN
  Administered 2022-09-08: 2 mg via INTRAVENOUS

## 2022-09-08 MED ORDER — BUPIVACAINE-EPINEPHRINE (PF) 0.5% -1:200000 IJ SOLN
INTRAMUSCULAR | Status: DC | PRN
  Administered 2022-09-08: 30 mL via PERINEURAL

## 2022-09-08 MED ORDER — KETOROLAC TROMETHAMINE 30 MG/ML IJ SOLN
30.0000 mg | Freq: Once | INTRAMUSCULAR | Status: AC
  Administered 2022-09-08: 30 mg via INTRAVENOUS
  Filled 2022-09-08: qty 1

## 2022-09-08 MED ORDER — CEFAZOLIN SODIUM-DEXTROSE 2-4 GM/100ML-% IV SOLN
INTRAVENOUS | Status: AC
  Filled 2022-09-08: qty 100

## 2022-09-08 MED ORDER — DEXMEDETOMIDINE HCL IN NACL 80 MCG/20ML IV SOLN
INTRAVENOUS | Status: AC
  Filled 2022-09-08: qty 20

## 2022-09-08 MED ORDER — DEXMEDETOMIDINE HCL IN NACL 80 MCG/20ML IV SOLN
INTRAVENOUS | Status: DC | PRN
  Administered 2022-09-08 (×2): 8 ug via BUCCAL

## 2022-09-08 MED ORDER — HYDROCODONE-ACETAMINOPHEN 10-325 MG PO TABS: 1.0000 | ORAL_TABLET | ORAL | 0 refills | Status: DC | PRN

## 2022-09-08 MED ORDER — ORAL CARE MOUTH RINSE: 15.0000 mL | Freq: Once | OROMUCOSAL | Status: DC

## 2022-09-08 MED ORDER — FENTANYL CITRATE (PF) 100 MCG/2ML IJ SOLN
INTRAMUSCULAR | Status: DC | PRN
  Administered 2022-09-08 (×8): 25 ug via INTRAVENOUS

## 2022-09-08 MED ORDER — FENTANYL CITRATE (PF) 100 MCG/2ML IJ SOLN
INTRAMUSCULAR | Status: AC
  Filled 2022-09-08: qty 2

## 2022-09-08 MED ORDER — LACTATED RINGERS IV SOLN: INTRAVENOUS | Status: DC

## 2022-09-08 MED ORDER — IBUPROFEN 800 MG PO TABS: 800.0000 mg | ORAL_TABLET | Freq: Three times a day (TID) | ORAL | 0 refills | Status: AC | PRN

## 2022-09-08 MED ORDER — HYDROMORPHONE HCL 1 MG/ML IJ SOLN
0.2500 mg | INTRAMUSCULAR | Status: DC | PRN
  Administered 2022-09-08 (×2): 0.5 mg via INTRAVENOUS
  Filled 2022-09-08 (×2): qty 0.5

## 2022-09-08 MED ORDER — BUPIVACAINE-EPINEPHRINE (PF) 0.5% -1:200000 IJ SOLN
INTRAMUSCULAR | Status: AC
  Filled 2022-09-08: qty 30

## 2022-09-08 MED ORDER — ACETAMINOPHEN 500 MG PO TABS
500.0000 mg | ORAL_TABLET | Freq: Once | ORAL | Status: AC
  Administered 2022-09-08: 500 mg via ORAL
  Filled 2022-09-08: qty 1

## 2022-09-08 MED ORDER — 0.9 % SODIUM CHLORIDE (POUR BTL) OPTIME
TOPICAL | Status: DC | PRN
  Administered 2022-09-08: 500 mL

## 2022-09-08 MED ORDER — CEFAZOLIN SODIUM-DEXTROSE 2-4 GM/100ML-% IV SOLN
2.0000 g | INTRAVENOUS | Status: AC
  Administered 2022-09-08: 2 g via INTRAVENOUS

## 2022-09-08 MED ORDER — PHENYLEPHRINE 80 MCG/ML (10ML) SYRINGE FOR IV PUSH (FOR BLOOD PRESSURE SUPPORT)
PREFILLED_SYRINGE | INTRAVENOUS | Status: AC
  Filled 2022-09-08: qty 10

## 2022-09-08 MED ORDER — HYDROCODONE-ACETAMINOPHEN 5-325 MG PO TABS
1.0000 | ORAL_TABLET | Freq: Once | ORAL | Status: AC
  Administered 2022-09-08: 1 via ORAL
  Filled 2022-09-08: qty 1

## 2022-09-08 MED ORDER — DEXAMETHASONE SODIUM PHOSPHATE 10 MG/ML IJ SOLN
INTRAMUSCULAR | Status: AC
  Filled 2022-09-08: qty 1

## 2022-09-08 MED ORDER — ONDANSETRON HCL 4 MG/2ML IJ SOLN
INTRAMUSCULAR | Status: AC
  Filled 2022-09-08: qty 2

## 2022-09-08 MED ORDER — ENOXAPARIN SODIUM 40 MG/0.4ML IJ SOSY
40.0000 mg | PREFILLED_SYRINGE | Freq: Once | INTRAMUSCULAR | Status: AC
  Administered 2022-09-08: 40 mg via SUBCUTANEOUS
  Filled 2022-09-08: qty 0.4

## 2022-09-08 MED ORDER — CHLORHEXIDINE GLUCONATE 0.12 % MT SOLN: 15.0000 mL | Freq: Once | OROMUCOSAL | Status: DC

## 2022-09-08 MED ORDER — ONDANSETRON HCL 4 MG/2ML IJ SOLN: 4.0000 mg | Freq: Once | INTRAMUSCULAR | Status: DC | PRN

## 2022-09-08 MED ORDER — MEPERIDINE HCL 50 MG/ML IJ SOLN: 6.2500 mg | INTRAMUSCULAR | Status: DC | PRN

## 2022-09-08 MED ORDER — PROPOFOL 10 MG/ML IV BOLUS
INTRAVENOUS | Status: DC | PRN
  Administered 2022-09-08: 180 mg via INTRAVENOUS

## 2022-09-08 MED ORDER — LIDOCAINE 2% (20 MG/ML) 5 ML SYRINGE
INTRAMUSCULAR | Status: DC | PRN
  Administered 2022-09-08: 80 mg via INTRAVENOUS

## 2022-09-08 SURGICAL SUPPLY — 72 items
APL PRP STRL LF DISP 70% ISPRP (MISCELLANEOUS) ×1
APL SKNCLS STERI-STRIP NONHPOA (GAUZE/BANDAGES/DRESSINGS) ×1
BAG HAMPER (MISCELLANEOUS) ×1 IMPLANT
BANDAGE ESMARK 4X12 BL STRL LF (DISPOSABLE) IMPLANT
BENZOIN TINCTURE PRP APPL 2/3 (GAUZE/BANDAGES/DRESSINGS) IMPLANT
BLADE EXCALIBUR 4.0X13 (MISCELLANEOUS) IMPLANT
BLADE SURG 15 STRL LF DISP TIS (BLADE) IMPLANT
BLADE SURG 15 STRL SS (BLADE) ×1
BLADE SURG SZ10 CARB STEEL (BLADE) IMPLANT
BNDG CMPR 12X4 ELC STRL LF (DISPOSABLE) ×1
BNDG CMPR STD VLCR NS LF 5.8X6 (GAUZE/BANDAGES/DRESSINGS) ×1
BNDG ELASTIC 4X5.8 VLCR STR LF (GAUZE/BANDAGES/DRESSINGS) IMPLANT
BNDG ELASTIC 6X5.8 VLCR NS LF (GAUZE/BANDAGES/DRESSINGS) ×1 IMPLANT
BNDG ESMARK 4X12 BLUE STRL LF (DISPOSABLE) ×1
CHLORAPREP W/TINT 26 (MISCELLANEOUS) ×1 IMPLANT
CLOTH BEACON ORANGE TIMEOUT ST (SAFETY) ×1 IMPLANT
COOLER ICEMAN CLASSIC (MISCELLANEOUS) ×1 IMPLANT
COVER LIGHT HANDLE STERIS (MISCELLANEOUS) ×2 IMPLANT
CUFF TOURN SGL QUICK 34 (TOURNIQUET CUFF) ×1
CUFF TRNQT CYL 34X4.125X (TOURNIQUET CUFF) IMPLANT
ELECT REM PT RETURN 9FT ADLT (ELECTROSURGICAL) ×1
ELECTRODE REM PT RTRN 9FT ADLT (ELECTROSURGICAL) IMPLANT
GAUZE SPONGE 4X4 12PLY STRL (GAUZE/BANDAGES/DRESSINGS) ×1 IMPLANT
GAUZE XEROFORM 5X9 LF (GAUZE/BANDAGES/DRESSINGS) ×1 IMPLANT
GLOVE BIOGEL PI IND STRL 7.0 (GLOVE) ×2 IMPLANT
GLOVE SS N UNI LF 8.5 STRL (GLOVE) ×1 IMPLANT
GLOVE SURG POLYISO LF SZ8 (GLOVE) ×1 IMPLANT
GOWN STRL REUS W/TWL LRG LVL3 (GOWN DISPOSABLE) ×1 IMPLANT
GOWN STRL REUS W/TWL XL LVL3 (GOWN DISPOSABLE) ×1 IMPLANT
IMP SYS 2ND FIX PEEK 4.75X19.1 (Miscellaneous) ×1 IMPLANT
IMPL QUADLINK SYSTEM 10 (Orthopedic Implant) IMPLANT
IMPL SYS 2ND FX PEEK 4.75X19.1 (Miscellaneous) IMPLANT
IMPLANT QUADLINK SYSTEM 10 (Orthopedic Implant) ×1 IMPLANT
IV NS IRRIG 3000ML ARTHROMATIC (IV SOLUTION) ×2 IMPLANT
KIT BLADEGUARD II DBL (SET/KITS/TRAYS/PACK) ×1 IMPLANT
KIT TRANSTIBIAL (DISPOSABLE) IMPLANT
KIT TURNOVER CYSTO (KITS) ×1 IMPLANT
KNIFE GRAFT ACL 9MM (MISCELLANEOUS) IMPLANT
MANIFOLD NEPTUNE II (INSTRUMENTS) ×1 IMPLANT
MARKER SKIN DUAL TIP RULER LAB (MISCELLANEOUS) ×1 IMPLANT
NDL HYPO 18GX1.5 BLUNT FILL (NEEDLE) ×1 IMPLANT
NDL HYPO 21X1.5 SAFETY (NEEDLE) ×1 IMPLANT
NDL SPNL 18GX3.5 QUINCKE PK (NEEDLE) ×1 IMPLANT
NEEDLE HYPO 18GX1.5 BLUNT FILL (NEEDLE) ×1 IMPLANT
NEEDLE HYPO 21X1.5 SAFETY (NEEDLE) ×1 IMPLANT
NEEDLE SPNL 18GX3.5 QUINCKE PK (NEEDLE) ×1 IMPLANT
NS IRRIG 500ML POUR BTL (IV SOLUTION) IMPLANT
PACK ARTHROSCOPY WL (CUSTOM PROCEDURE TRAY) ×1 IMPLANT
PACK BASIC III (CUSTOM PROCEDURE TRAY) ×1
PACK SRG BSC III STRL LF ECLPS (CUSTOM PROCEDURE TRAY) IMPLANT
PAD ABD 5X9 TENDERSORB (GAUZE/BANDAGES/DRESSINGS) ×1 IMPLANT
PAD ARMBOARD 7.5X6 YLW CONV (MISCELLANEOUS) ×1 IMPLANT
PAD COLD SHLDR SM WRAP-ON (PAD) IMPLANT
PADDING CAST COTTON 6X4 STRL (CAST SUPPLIES) ×1 IMPLANT
PENCIL SMOKE EVACUATOR (MISCELLANEOUS) IMPLANT
PORT APPOLLO RF 90DEGREE MULTI (SURGICAL WAND) IMPLANT
SCREW INTERFERENCE FT BC 9X20 (Screw) IMPLANT
SET ARTHROSCOPY INST (INSTRUMENTS) ×1 IMPLANT
SET BASIN LINEN APH (SET/KITS/TRAYS/PACK) ×1 IMPLANT
SPONGE T-LAP 18X18 ~~LOC~~+RFID (SPONGE) IMPLANT
STRIP CLOSURE SKIN 1/2X4 (GAUZE/BANDAGES/DRESSINGS) IMPLANT
SUT ETHIBOND 5 LR DA (SUTURE) IMPLANT
SUT ETHILON 3 0 FSL (SUTURE) ×1 IMPLANT
SUT MON AB 2-0 CT1 36 (SUTURE) IMPLANT
SUT VIC AB 1 CT1 27 (SUTURE) ×5
SUT VIC AB 1 CT1 27XBRD ANTBC (SUTURE) IMPLANT
SYR 10ML LL (SYRINGE) ×1 IMPLANT
SYR 30ML LL (SYRINGE) ×2 IMPLANT
SYR BULB IRRIG 60ML STRL (SYRINGE) IMPLANT
TUBE CONNECTING 12X1/4 (SUCTIONS) ×2 IMPLANT
TUBING IN/OUT FLOW W/MAIN PUMP (TUBING) ×1 IMPLANT
YANKAUER SUCT 12FT TUBE ARGYLE (SUCTIONS) IMPLANT

## 2022-09-08 NOTE — Brief Op Note (Signed)
09/08/2022  11:16 AM  PATIENT:  Kristine Adams  37 y.o. female  PRE-OPERATIVE DIAGNOSIS:  Right medial meniscus tear possible Anterior cruciate ligament tear  POST-OPERATIVE DIAGNOSIS:  Right lateral meniscus tear and Anterior cruciate ligament tear  PROCEDURE:  Procedure(s): KNEE ARTHROSCOPY EXAM UNDER ANESTHESIA (Right) ANTERIOR CRUCIATE LIGAMENT (ACL) RECONSTRUCTION WITH QUADRICEPS AUTOGRAFT  Findings  Exam under anesthesia right knee The Lachman test was 2+ compared to the left knee The pivot shift was grade 1 compared to the left knee  Arthroscopic findings The anteromedial bundle was intact but scarred to the PCL posterolateral bundle was torn.  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  The right knee was marked with a surgical marker with my initials.  Chart review was completed including imaging.  Discussion was performed with the surgical staff and [employer] representative to ensure all equipment was available  Patient was taken the operating given general anesthesia.  I then did an exam under anesthesia and examine both knees.  The patient had full range of motion a grade 2 Lachman and 1+ pivot shift compared to the opposite left knee  After sterile prep and drape and timeout  Lateral portal was made and the scope was placed into the joint and a diagnostic arthroscopy was performed.  The findings are listed above.  A medial portal established with a spinal needle and shaver was used to remove fat pad and ligamentum mucosum and then evaluation of the ACL bundles was completed as stated above  Once I determined that the ligament was not  repairable I remove the remnants I marked the footprints on the femur and tibia  I then exsanguinated the limb and inflated the tourniquet to 280 mm.  I made a longitudinal incision over the quadriceps tendon approximately 5 cm in length.  I divided the subcutaneous tissue.  I did blunt dissection to expose the quadriceps tendon measured 9 mm with graft attached to from the patella and then excised 90 mm of quadriceps tendon using sharp dissection double edged knife and then [employer] harvester.  This was prepared on the back table per manufacture technique.  I then used the flip cutter to perform the femoral tunnel drilling drilling a 28 mm tunnel.  I then drilled the tibial tunnel and the ACL footprint.  I cleaned both tunnels.  I remove debris from the joint.  I then passed the graft which had been tension on the back table.  I cycled the knee several times.  I extended the femoral incision to ensure that the femoral button was against the bone as it had come outside of the iliotibial band.  I adjusted the graft in terms of the thickness at the end to ensure passage into the tibial tunnel.  After cycling the knee again I fixed the distal end with an [employer] 20 x 9 screw and a backup fixation using a swivel lock.  I further cycled the knee tensioned the graft again examined and arthroscopically found to be in good position with good tension.  I checked the Lachman test I was satisfied that joint integrity and stability had been restored  Final pictures were taken to ensure full extension with no  impingement and tension on the graft was good as well.  There was no hardware in the joint.  I irrigated the wounds and the joint and closed the quadriceps tendon defect with #1 Vicryl in interrupted fashion I used subcuticular stitches after #1 Vicryl to close the subcu and 2-0 Monocryl for the skin.  I continued this on the lateral and tibial incision.  I closed the portals with 2-0 Monocryl  Sterile  dressings were applied after injecting all the wounds with Marcaine with epinephrine.  A Cryo/Cuff and straight leg brace were applied.  Plan weight-bear as tolerated with a locked knee brace  SURGEON:  Surgeon(s) and Role:    * Carole Civil, MD - Primary  PHYSICIAN ASSISTANT:   ASSISTANTS: Nikki Cox  ANESTHESIA:   general  EBL:  25 mL   BLOOD ADMINISTERED:none  DRAINS: none   LOCAL MEDICATIONS USED:  MARCAINE     SPECIMEN:  No Specimen  DISPOSITION OF SPECIMEN:  N/A  COUNTS:  YES  TOURNIQUET:   Total Tourniquet Time Documented: Thigh (Right) - 137 minutes Total: Thigh (Right) - 137 minutes   DICTATION: .Viviann Spare Dictation  PLAN OF CARE: Discharge to home after PACU  PATIENT DISPOSITION:  PACU - hemodynamically stable.   Delay start of Pharmacological VTE agent (>24hrs) due to surgical blood loss or risk of bleeding: not applicable

## 2022-09-08 NOTE — Op Note (Signed)
09/08/2022  11:16 AM  PATIENT:  Kristine Adams  37 y.o. female  PRE-OPERATIVE DIAGNOSIS:  Right medial meniscus tear possible Anterior cruciate ligament tear  POST-OPERATIVE DIAGNOSIS:  Right lateral meniscus tear and Anterior cruciate ligament tear  PROCEDURE:  Procedure(s): KNEE ARTHROSCOPY EXAM UNDER ANESTHESIA (Right) ANTERIOR CRUCIATE LIGAMENT (ACL) RECONSTRUCTION WITH QUADRICEPS AUTOGRAFT  Findings  Exam under anesthesia right knee The Lachman test was 2+ compared to the left knee The pivot shift was grade 1 compared to the left knee  Arthroscopic findings The anteromedial bundle was intact but scarred to the PCL posterolateral bundle was torn.  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  The right knee was marked with a surgical marker with my initials.  Chart review was completed including imaging.  Discussion was performed with the surgical staff and [employer] representative to ensure all equipment was available  Patient was taken the operating given general anesthesia.  I then did an exam under anesthesia and examine both knees.  The patient had full range of motion a grade 2 Lachman and 1+ pivot shift compared to the opposite left knee  After sterile prep and drape and timeout  Lateral portal was made and the scope was placed into the joint and a diagnostic arthroscopy was performed.  The findings are listed above.  A medial portal established with a spinal needle and shaver was used to remove fat pad and ligamentum mucosum and then evaluation of the ACL bundles was completed as stated above  Once I determined that the ligament was not  repairable I remove the remnants I marked the footprints on the femur and tibia  I then exsanguinated the limb and inflated the tourniquet to 280 mm.  I made a longitudinal incision over the quadriceps tendon approximately 5 cm in length.  I divided the subcutaneous tissue.  I did blunt dissection to expose the quadriceps tendon measured 9 mm with graft attached to from the patella and then excised 90 mm of quadriceps tendon using sharp dissection double edged knife and then [employer] harvester.  This was prepared on the back table per manufacture technique.  I then used the flip cutter to perform the femoral tunnel drilling drilling a 28 mm tunnel.  I then drilled the tibial tunnel and the ACL footprint.  I cleaned both tunnels.  I remove debris from the joint.  I then passed the graft which had been tension on the back table.  I cycled the knee several times.  I extended the femoral incision to ensure that the femoral button was against the bone as it had come outside of the iliotibial band.  I adjusted the graft in terms of the thickness at the end to ensure passage into the tibial tunnel.  After cycling the knee again I fixed the distal end with an [employer] 20 x 9 screw and a backup fixation using a swivel lock.  I further cycled the knee tensioned the graft again examined and arthroscopically found to be in good position with good tension.  I checked the Lachman test I was satisfied that joint integrity and stability had been restored  Final pictures were taken to ensure full extension with no  impingement and tension on the graft was good as well.  There was no hardware in the joint.  I irrigated the wounds and the joint and closed the quadriceps tendon defect with #1 Vicryl in interrupted fashion I used subcuticular stitches after #1 Vicryl to close the subcu and 2-0 Monocryl for the skin.  I continued this on the lateral and tibial incision.  I closed the portals with 2-0 Monocryl  Sterile  dressings were applied after injecting all the wounds with Marcaine with epinephrine.  A Cryo/Cuff and straight leg brace were applied.  Plan weight-bear as tolerated with a locked knee brace  SURGEON:  Surgeon(s) and Role:    * Carole Civil, MD - Primary  PHYSICIAN ASSISTANT:   ASSISTANTS: Nikki Cox  ANESTHESIA:   general  EBL:  25 mL   BLOOD ADMINISTERED:none  DRAINS: none   LOCAL MEDICATIONS USED:  MARCAINE     SPECIMEN:  No Specimen  DISPOSITION OF SPECIMEN:  N/A  COUNTS:  YES  TOURNIQUET:   Total Tourniquet Time Documented: Thigh (Right) - 137 minutes Total: Thigh (Right) - 137 minutes   DICTATION: .Viviann Spare Dictation  PLAN OF CARE: Discharge to home after PACU  PATIENT DISPOSITION:  PACU - hemodynamically stable.   Delay start of Pharmacological VTE agent (>24hrs) due to surgical blood loss or risk of bleeding: not applicable

## 2022-09-08 NOTE — Transfer of Care (Signed)
Immediate Anesthesia Transfer of Care Note  Patient: Kristine Adams  Procedure(s) Performed: KNEE ARTHROSCOPY EXAM UNDER ANESTHESIA (Right: Knee) ANTERIOR CRUCIATE LIGAMENT (ACL) REPAIR WITH QUADRICEPS AUTOGRAFT (Knee)  Patient Location: PACU  Anesthesia Type:General  Level of Consciousness: sedated, patient cooperative, and responds to stimulation  Airway & Oxygen Therapy: Patient Spontanous Breathing and Patient connected to nasal cannula oxygen  Post-op Assessment: Report given to RN, Post -op Vital signs reviewed and stable, and Patient moving all extremities  Post vital signs: Reviewed and stable  Last Vitals:  Vitals Value Taken Time  BP 101/52 09/08/22 1054  Temp    Pulse 76 09/08/22 1056  Resp 24 09/08/22 1056  SpO2 88 % 09/08/22 1056  Vitals shown include unvalidated device data.  Last Pain:  Vitals:   09/08/22 0649  PainSc: 4          Complications: No notable events documented.

## 2022-09-08 NOTE — Anesthesia Postprocedure Evaluation (Signed)
Anesthesia Post Note  Patient: Location manager  Procedure(s) Performed: KNEE ARTHROSCOPY EXAM UNDER ANESTHESIA (Right: Knee) ANTERIOR CRUCIATE LIGAMENT (ACL) REPAIR WITH QUADRICEPS AUTOGRAFT (Knee)  Patient location during evaluation: Phase II Anesthesia Type: General Level of consciousness: awake and alert and oriented Pain management: pain level controlled Vital Signs Assessment: post-procedure vital signs reviewed and stable Respiratory status: spontaneous breathing, nonlabored ventilation and respiratory function stable Cardiovascular status: blood pressure returned to baseline and stable Postop Assessment: no apparent nausea or vomiting Anesthetic complications: no  No notable events documented.   Last Vitals:  Vitals:   09/08/22 1230 09/08/22 1240  BP: 111/67 111/68  Pulse: 64 63  Resp: 12 16  Temp:  (!) 36.4 C  SpO2: 100% 100%    Last Pain:  Vitals:   09/08/22 1240  TempSrc: Oral  PainSc: 6                  Kristine Adams

## 2022-09-08 NOTE — Telephone Encounter (Signed)
I called her I am not with him, he is in surgery all day but can send order  She is asking about the Lovenox  Delay normally 12 hours post op due to risk of bleeding at surgery site wants verbal ok from Dr Lemmie Evens before giving in PACU I have notified Dr Aline Brochure to contact her and told Ricci Barker I will have him call her

## 2022-09-08 NOTE — Interval H&P Note (Signed)
History and Physical Interval Note:  09/08/2022 7:19 AM  Sublimity  has presented today for surgery, with the diagnosis of Right medial meniscus tear possible Anterior cruciate ligament tear.  The various methods of treatment have been discussed with the patient and family. After consideration of risks, benefits and other options for treatment, the patient has consented to  Procedure(s): KNEE ARTHROSCOPY WITH MEDIAL MENISCECTOMY EXAM UNDER ANESTHESIA POSSIBLE ANTERIOR CRUCIATE LIGAMENT REPAIR WITH QUADRICEPS AUTOGRAFT (Right) as a surgical intervention.  The patient's history has been reviewed, patient examined, no change in status, stable for surgery.  I have reviewed the patient's chart and labs.  Questions were answered to the patient's satisfaction.     Arther Abbott

## 2022-09-08 NOTE — Anesthesia Preprocedure Evaluation (Signed)
Anesthesia Evaluation  Patient identified by MRN, date of birth, ID band Patient awake    Reviewed: Allergy & Precautions, H&P , NPO status , Patient's Chart, lab work & pertinent test results  Airway Mallampati: II  TM Distance: >3 FB Neck ROM: Full    Dental no notable dental hx. (+) Dental Advisory Given, Caps   Pulmonary neg pulmonary ROS   Pulmonary exam normal breath sounds clear to auscultation       Cardiovascular + DVT  Normal cardiovascular exam Rhythm:Regular Rate:Normal     Neuro/Psych negative neurological ROS  negative psych ROS   GI/Hepatic negative GI ROS, Neg liver ROS,,,  Endo/Other  negative endocrine ROS    Renal/GU negative Renal ROS  negative genitourinary   Musculoskeletal negative musculoskeletal ROS (+)    Abdominal   Peds negative pediatric ROS (+)  Hematology  (+) Blood dyscrasia, anemia   Anesthesia Other Findings   Reproductive/Obstetrics negative OB ROS                             Anesthesia Physical Anesthesia Plan  ASA: 2  Anesthesia Plan: General   Post-op Pain Management: Dilaudid IV   Induction: Intravenous  PONV Risk Score and Plan: 3 and Ondansetron, Dexamethasone and Midazolam  Airway Management Planned: LMA  Additional Equipment:   Intra-op Plan:   Post-operative Plan: Extubation in OR  Informed Consent: I have reviewed the patients History and Physical, chart, labs and discussed the procedure including the risks, benefits and alternatives for the proposed anesthesia with the patient or authorized representative who has indicated his/her understanding and acceptance.     Dental advisory given  Plan Discussed with: CRNA and Surgeon  Anesthesia Plan Comments:         Anesthesia Quick Evaluation

## 2022-09-08 NOTE — Telephone Encounter (Signed)
Dr. Aline Brochure pt - Kristine Adams [phone] called regarding this patient, she needs clarification on medication order postop.

## 2022-09-08 NOTE — Anesthesia Procedure Notes (Signed)
Procedure Name: LMA Insertion Date/Time: 09/08/2022 7:35 AM  Performed by: Myna Bright, CRNAPre-anesthesia Checklist: Patient identified, Emergency Drugs available, Suction available and Patient being monitored Patient Re-evaluated:Patient Re-evaluated prior to induction Oxygen Delivery Method: Circle system utilized Preoxygenation: Pre-oxygenation with 100% oxygen Induction Type: IV induction Ventilation: Mask ventilation without difficulty LMA: LMA inserted LMA Size: 4.0 Number of attempts: 1 Placement Confirmation: positive ETCO2 and breath sounds checked- equal and bilateral Tube secured with: Tape Dental Injury: Teeth and Oropharynx as per pre-operative assessment

## 2022-09-08 NOTE — Consult Note (Signed)
Discussed with pharmacy using lovenox immediately post op   Based on her history of dvt in the operative leg 6-8 months ago I believe the risks of bleeding are less than the benefits of preventing recurrent dvt with immediate lovenox  She has a language barrier and financial constraints which may lead to a missed opportunity for prophylaxis   The surgery is not associated with excessive bleeding

## 2022-09-16 ENCOUNTER — Encounter: Payer: Self-pay | Admitting: Orthopedic Surgery

## 2022-09-16 ENCOUNTER — Encounter (HOSPITAL_COMMUNITY): Payer: Self-pay | Admitting: Orthopedic Surgery

## 2022-09-17 ENCOUNTER — Encounter: Payer: Self-pay | Admitting: Orthopedic Surgery

## 2022-09-17 ENCOUNTER — Telehealth: Payer: Self-pay

## 2022-09-17 ENCOUNTER — Ambulatory Visit (INDEPENDENT_AMBULATORY_CARE_PROVIDER_SITE_OTHER): Payer: Self-pay | Admitting: Orthopedic Surgery

## 2022-09-17 DIAGNOSIS — Z9889 Other specified postprocedural states: Secondary | ICD-10-CM

## 2022-09-17 NOTE — Telephone Encounter (Signed)
Patient reached out and requested you and Dr.Harrison be informed that she has an appointment with the chiropractor tomorrow.

## 2022-09-17 NOTE — Progress Notes (Signed)
Chief Complaint  Patient presents with   Post-op Follow-up    Right knee/ brace was at ankle, had slid down, will adjust today, using ice ACL repair 09/08/22 states knee feels hot and foot (bottom of foot) feels cold     PROCEDURE:  Procedure(s): KNEE ARTHROSCOPY EXAM UNDER ANESTHESIA (Right) ANTERIOR CRUCIATE LIGAMENT (ACL) RECONSTRUCTION WITH QUADRICEPS AUTOGRAFT   Findings   Exam under anesthesia right knee The Lachman test was 2+ compared to the left knee The pivot shift was grade 1 compared to the left knee   Arthroscopic findings The anteromedial bundle was intact but scarred to the PCL posterolateral bundle was torn.  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 Posterior horn lateral meniscus tear did not require treatment   Remaining chondral surfaces intact This is a first postop visit status post quadriceps ACL repair with autogenous graft on September 08, 2022  The patient is doing well only taking ibuprofen for pain she is on aspirin secondary to her preop DVT after initial injury  Her knee is full extension and has a good Lachman test her wounds look clean replace Steri-Strips over all the wounds  She can start physical therapy ice 3 times a day full weightbearing in the brace brace still locked in full extension until therapist opens the brace up  Follow-up in 3 to 4 weeks  Interpreter present

## 2022-09-17 NOTE — Telephone Encounter (Signed)
Patient stated she has an eval appt tomorrow with PT.

## 2022-09-17 NOTE — Patient Instructions (Signed)
Physical therapy has been ordered for you at [facility]. They should call you to schedule, [phone] is the phone number to call, if you want to call to schedule.   

## 2022-09-18 ENCOUNTER — Ambulatory Visit (HOSPITAL_COMMUNITY): Payer: Self-pay | Attending: Orthopedic Surgery

## 2022-09-18 ENCOUNTER — Other Ambulatory Visit: Payer: Self-pay

## 2022-09-18 DIAGNOSIS — R262 Difficulty in walking, not elsewhere classified: Secondary | ICD-10-CM | POA: Insufficient documentation

## 2022-09-18 DIAGNOSIS — Z9889 Other specified postprocedural states: Secondary | ICD-10-CM | POA: Insufficient documentation

## 2022-09-18 DIAGNOSIS — M25561 Pain in right knee: Secondary | ICD-10-CM | POA: Insufficient documentation

## 2022-09-18 DIAGNOSIS — R29898 Other symptoms and signs involving the musculoskeletal system: Secondary | ICD-10-CM | POA: Insufficient documentation

## 2022-09-18 NOTE — Therapy (Signed)
OUTPATIENT PHYSICAL THERAPY LOWER EXTREMITY EVALUATION   Patient Name: Kristine Adams MRN: [id number] DOB:03/03/1986, 37 y.o., female Today's Date: 09/18/2022  END OF SESSION:  PT End of Session - 09/18/22 1128     Visit Number 1    Number of Visits 16    Date for PT Re-Evaluation 10/30/22    Authorization Type 50% [facility] [employer]    PT Start Time 0900    PT Stop Time 0950    PT Time Calculation (min) 50 min    Activity Tolerance Patient tolerated treatment well    Behavior During Therapy WFL for tasks assessed/performed            Past Medical History:  Diagnosis Date   Anemia    Hyperlipidemia    No pertinent past medical history    Past Surgical History:  Procedure Laterality Date   ANTERIOR CRUCIATE LIGAMENT REPAIR Right 09/08/2022   Procedure: ANTERIOR CRUCIATE LIGAMENT (ACL) REPAIR WITH QUADRICEPS AUTOGRAFT;  Surgeon: Carole Civil, MD;  Location: [facility];  Service: Orthopedics;  Laterality: Right;   BREAST LUMPECTOMY WITH RADIOACTIVE SEED LOCALIZATION Right 07/23/2020   Procedure: RIGHT BREAST LUMPECTOMYX 2  WITH RADIOACTIVE SEED LOCALIZATION;  Surgeon: Erroll Luna, MD;  Location: [place];  Service: General;  Laterality: Right;   CESAREAN SECTION     CESAREAN SECTION WITH BILATERAL TUBAL LIGATION  07/11/2012   Procedure: CESAREAN SECTION WITH BILATERAL TUBAL LIGATION;  Surgeon: Jonnie Kind, MD;  Location: [facility];  Service: Obstetrics;  Laterality: Bilateral;  speaks spanish, interpreter needed   KNEE ARTHROSCOPY WITH MEDIAL MENISECTOMY Right 09/08/2022   Procedure: KNEE ARTHROSCOPY EXAM UNDER ANESTHESIA;  Surgeon: Carole Civil, MD;  Location: [facility];  Service: Orthopedics;  Laterality: Right;   TUBAL LIGATION     Patient Active Problem List   Diagnosis Date Noted   Rupture of anterior cruciate ligament of right knee 09/08/2022   Right shoulder pain 05/12/2022   History of DVT of lower extremity 05/12/2022    Encounter for gynecological examination with Papanicolaou smear of cervix 05/12/2022   Right knee pain 05/12/2022   Dyspareunia, female 05/12/2022   DVT (deep venous thrombosis) ([place]) 03/16/2022   Itching of vulva 07/28/2021   Screening breast examination 01/24/2019   Left breast mass 01/24/2019   Menorrhagia with regular cycle 09/21/2018    PCP: Sofie Rower, PA-C  REFERRING PROVIDER: Carole Civil, MD  REFERRING DIAG: [phone] (ICD-10-CM) - S/P ACL reconstruction  THERAPY DIAG:  Right knee pain, unspecified chronicity  Difficulty walking  Weakness of right lower extremity  Rationale for Evaluation and Treatment: Rehabilitation  ONSET DATE: 09/08/2022  SUBJECTIVE:   SUBJECTIVE STATEMENT: Arrives to the clinic  with a hinged knee brace and B axillary crutches S/P R ACL. Patient had to have surgery following an incident of fall in July 2023. However, patient had a blood clot so they treated the blood clot first before she had the ACL surgery. Patient is currently taking ASA for the blood clot. Per patient, patient is WBAT with the brace on. Patient was advised by surgeon to put ice 3x/day for 30 minutes and is helping. Patient is now referred to outpatient PT evaluation and management.  PERTINENT HISTORY: Dizziness, blood clots PAIN:  Are you having pain? Yes: NPRS scale: 4/10 Pain location: in front of the R knee Pain description: pulsating Aggravating factors: moving the knee, walking Relieving factors: rest  PRECAUTIONS: Other: Can take the brace when laying down. Needs to  wear the brace locked in ext for 4 weeks when upright  WEIGHT BEARING RESTRICTIONS: Yes WBAT  FALLS:  Has patient fallen in last 6 months? No  LIVING ENVIRONMENT: Lives with: lives with their family Lives in: Mobile home Stairs: Yes: External: 7 steps; can reach both Has following equipment at home: Crutches  OCCUPATION: in food preparation  PLOF: Independent  PATIENT GOALS: "to  walk perfectly so I can get back to work"  NEXT MD VISIT: October 14, 2022  OBJECTIVE:   DIAGNOSTIC FINDINGS:  MRI OF THE RIGHT KNEE WITHOUT CONTRAST 03/04/2022   TECHNIQUE: Multiplanar, multisequence MR imaging of the right was performed. No intravenous contrast was administered.   COMPARISON:  Radiographs dated January 06, 2022 views of the ladder physical apical alignment disc shaft   FINDINGS: MENISCI   Medial: Intact.   Lateral: Radial tear of the posterior horn of the lateral meniscus.   LIGAMENTS   Cruciates: PCL is intact. Thickening and edema of the ACL without evidence of tear suggesting ligamentous sprain.   Collaterals: Thickening and edema of the medial collateral ligament suggesting partial-thickness tear, likely chronic process. Lateral collateral ligament complex is intact.   CARTILAGE   Patellofemoral:  No chondral defect.   Medial: Mild partial-thickness cartilage loss of the medial femorotibial compartment.   Lateral:  No chondral defect.   JOINT: Moderate joint effusion. Normal Hoffa's fat-pad. No plical thickening.   POPLITEAL FOSSA: Popliteus tendon is intact. No Baker's cyst.   EXTENSOR MECHANISM: Intact quadriceps tendon. Mild patellar tendinosis without tear. Intact lateral patellar retinaculum. Intact medial patellar retinaculum. Intact MPFL.   BONES: No aggressive osseous lesion. No fracture or dislocation.   Other: No fluid collection or hematoma. Muscles are normal.   IMPRESSION: 1.  Radial tear of the posterior horn of the lateral meniscus.   2. Thickening/partial-thickness tear of the medial collateral ligament,, likely a chronic process.   2. Edema and thickening of the anterior cruciate ligament without evidence of discrete tear suggesting chronic ligamentous sprain.   3.  Moderate knee joint effusion.   4.  No evidence of fracture or osteonecrosis.  PATIENT SURVEYS:  FOTO 23.08  COGNITION: Overall cognitive status:  Within functional limits for tasks assessed     SENSATION: Not tested  EDEMA:  Slight swelling on R knee. Bandage present without drainage. Knee brace locked in extension  MUSCLE LENGTH: Moderate restriction on B hamstrings Mild restriction on B hamstrings  POSTURE: rounded shoulders and forward head  PALPATION: Grade 1 tenderness on major bony landmarks on the R knee  LOWER EXTREMITY ROM:  Active ROM Right eval Left eval  Hip flexion [facility]  Hip extension [facility]  Hip abduction [facility]  Hip adduction    Hip internal rotation    Hip external rotation    Knee flexion 20 WFL  Knee extension 0 WFL  Ankle dorsiflexion [facility]  Ankle plantarflexion [facility] WFL  Ankle inversion    Ankle eversion     (Blank rows = not tested)  LOWER EXTREMITY MMT:  MMT Right eval Left eval  Hip flexion 2- 5  Hip extension 3+ 5  Hip abduction 3+ 5  Hip adduction    Hip internal rotation    Hip external rotation    Knee flexion  5  Knee extension  5  Ankle dorsiflexion 4 5  Ankle plantarflexion 4 5  Ankle inversion    Ankle eversion     (Blank rows = not tested)  FUNCTIONAL TESTS:  2 minute walk test: 216 ft done with knee brace and B axillary crutches  GAIT: Distance walked: 216 ft Assistive device utilized: Crutches Level of assistance: Complete Independence Comments: Modified 3-point pattern   TODAY'S TREATMENT:                                                                                                                              DATE:  09/18/2022 Evaluation and patient education done R Heel slides with strap x 10 R quads sets x 6" x 10    PATIENT EDUCATION:  Education details: Educated on the goals and course of rehab. Written HEP provided and reviewed Person educated: Patient Education method: Explanation, Demonstration, and Handouts Education comprehension: verbalized understanding and returned demonstration  HOME EXERCISE PROGRAM: Access Code:  [email or web address] URL: [email or web address] Date: 09/18/2022 Prepared by: Rexene Alberts  Exercises - Supine Heel Slide with Strap  - 2 x daily - 7 x weekly - 3 sets - 10 reps - Supine Quad Set  - 2 x daily - 7 x weekly - 3 sets - 10 reps - 6 hold  ASSESSMENT:  CLINICAL IMPRESSION: Patient is a 37 y.o. female who was seen today for physical therapy evaluation and treatment for S/P ACL reconstruction. Patient is S/P ACL reconstruction further defined by difficulty with walking due to pain, weakness, impaired proprioception, and decreased soft tissue extensibility. Skilled PT is required to address the impairments and functional limitations listed below.   OBJECTIVE IMPAIRMENTS: Abnormal gait, decreased activity tolerance, decreased balance, difficulty walking, decreased ROM, decreased strength, impaired flexibility, and pain.   ACTIVITY LIMITATIONS: carrying, lifting, bending, sitting, squatting, sleeping, stairs, transfers, bed mobility, bathing, toileting, dressing, and hygiene/grooming  PARTICIPATION LIMITATIONS: meal prep, cleaning, laundry, driving, shopping, community activity, and occupation  PERSONAL FACTORS: Time since onset of injury/illness/exacerbation are also affecting patient's functional outcome.   REHAB POTENTIAL: Good  CLINICAL DECISION MAKING: Stable/uncomplicated  EVALUATION COMPLEXITY: Low   GOALS: Goals reviewed with patient? Yes  SHORT TERM GOALS: Target date: 10/09/2022 Pt will demonstrate indep in HEP to facilitate carry-over of skilled services and improve functional outcomes Goal status: INITIAL  2.  Patient will demonstrate increase in knee flex ROM to 120 degrees to facilitate ease in ambulation Baseline: 20 degrees Goal status: INITIAL   LONG TERM GOALS: Target date: 10/30/2022  Pt will increase 2MWT by at least 40 ft without assistive device in order to demonstrate clinically significant improvement in community ambulation Baseline: 216  ft Goal status: INITIAL  2.  Pt will increase FOTO to the predicted pattern in order to demonstrate significant improvement in function related to ambulation  Baseline: 23.08 Goal status: INITIAL  3.  Pt will demonstrate increase in LE strength to 5 to facilitate ease and safety in ambulation  Baseline: 2-/5 Goal status: INITIAL  4.  Pt will be able to perform SLS on the R to 15 sec (firm surface, eyes open( to facilitate ease and  safety and ambulation Baseline: not performed Goal status: INITIAL  PLAN:  PT FREQUENCY: 3x/week  PT DURATION: 6 weeks  PLANNED INTERVENTIONS: Therapeutic exercises, Therapeutic activity, Neuromuscular re-education, Balance training, Gait training, Patient/Family education, Self Care, Stair training, and Manual therapy  PLAN FOR NEXT SESSION: May begin LE ROM and strengthening per protocol and as tolerated by patient   Chrissie Noa L. Ansel Ferrall, PT, DPT, OCS Board-Certified Clinical Specialist in [place] # ([place]): [phone] T 09/18/2022, 11:47 AM

## 2022-09-28 ENCOUNTER — Telehealth: Payer: Self-pay | Admitting: Orthopedic Surgery

## 2022-09-28 NOTE — Telephone Encounter (Signed)
Dr. Romeo Apple pt - Patient called w/an interpreter, experiencing dizziness.  [facility] and they couldn't see her, they are full.  She is requesting that Dr. Romeo Apple transfer her to another doctor to have her BP checked.  [phone]

## 2022-09-28 NOTE — Telephone Encounter (Signed)
I can't do that   Go to the ER   Pick a new primary care

## 2022-09-29 ENCOUNTER — Ambulatory Visit (HOSPITAL_COMMUNITY): Payer: Self-pay | Attending: Orthopedic Surgery | Admitting: Physical Therapy

## 2022-09-29 DIAGNOSIS — R29898 Other symptoms and signs involving the musculoskeletal system: Secondary | ICD-10-CM | POA: Insufficient documentation

## 2022-09-29 DIAGNOSIS — R262 Difficulty in walking, not elsewhere classified: Secondary | ICD-10-CM | POA: Insufficient documentation

## 2022-09-29 DIAGNOSIS — M25561 Pain in right knee: Secondary | ICD-10-CM | POA: Insufficient documentation

## 2022-09-29 NOTE — Telephone Encounter (Signed)
Spoke w/someone in the home that speaks [place], verbalized understanding

## 2022-09-29 NOTE — Therapy (Signed)
OUTPATIENT PHYSICAL THERAPY TREATMENT   Patient Name: Kristine Adams MRN: [id number] DOB:09-09-1985, 37 y.o., female Today's Date: 09/29/2022  END OF SESSION:  PT End of Session - 09/29/22 0913     Visit Number 2    Number of Visits 16    Date for PT Re-Evaluation 10/30/22    Authorization Type 50% [facility] [employer]    PT Start Time 0905    PT Stop Time 0945    PT Time Calculation (min) 40 min    Activity Tolerance Patient tolerated treatment well    Behavior During Therapy [facility] for tasks assessed/performed            Past Medical History:  Diagnosis Date   Anemia    Hyperlipidemia    No pertinent past medical history    Past Surgical History:  Procedure Laterality Date   ANTERIOR CRUCIATE LIGAMENT REPAIR Right 09/08/2022   Procedure: ANTERIOR CRUCIATE LIGAMENT (ACL) REPAIR WITH QUADRICEPS AUTOGRAFT;  Surgeon: Vickki Hearing, MD;  Location: [facility];  Service: Orthopedics;  Laterality: Right;   BREAST LUMPECTOMY WITH RADIOACTIVE SEED LOCALIZATION Right 07/23/2020   Procedure: RIGHT BREAST LUMPECTOMYX 2  WITH RADIOACTIVE SEED LOCALIZATION;  Surgeon: Harriette Bouillon, MD;  Location: [facility];  Service: General;  Laterality: Right;   CESAREAN SECTION     CESAREAN SECTION WITH BILATERAL TUBAL LIGATION  07/11/2012   Procedure: CESAREAN SECTION WITH BILATERAL TUBAL LIGATION;  Surgeon: Tilda Burrow, MD;  Location: [facility];  Service: Obstetrics;  Laterality: Bilateral;  speaks spanish, interpreter needed   KNEE ARTHROSCOPY WITH MEDIAL MENISECTOMY Right 09/08/2022   Procedure: KNEE ARTHROSCOPY EXAM UNDER ANESTHESIA;  Surgeon: Vickki Hearing, MD;  Location: [facility];  Service: Orthopedics;  Laterality: Right;   TUBAL LIGATION     Patient Active Problem List   Diagnosis Date Noted   Rupture of anterior cruciate ligament of right knee 09/08/2022   Right shoulder pain 05/12/2022   History of DVT of lower extremity 05/12/2022   Encounter for  gynecological examination with Papanicolaou smear of cervix 05/12/2022   Right knee pain 05/12/2022   Dyspareunia, female 05/12/2022   DVT (deep venous thrombosis) 03/16/2022   Itching of vulva 07/28/2021   Screening breast examination 01/24/2019   Left breast mass 01/24/2019   Menorrhagia with regular cycle 09/21/2018    PCP: Reather Converse, PA-C  REFERRING PROVIDER: Vickki Hearing, MD  REFERRING DIAG: [phone] (ICD-10-CM) - S/P ACL reconstruction  THERAPY DIAG:  Right knee pain, unspecified chronicity  Difficulty walking  Weakness of right lower extremity  Rationale for Evaluation and Treatment: Rehabilitation  ONSET DATE: 09/08/2022  SUBJECTIVE:   SUBJECTIVE STATEMENT: Pt comes with interpreter Marena Chancy) and 24 y/o son.  Pt states she was having dizziness issues and contacted her surgeon who recommended a follow up with primary as her hemaglobin was low at surgery.  Pt has not done this yet but is feeling better overall.  Today reports pain at 4/10 in Rt knee.  States she can lift her leg better.  Followed up with MD regarding brace and was told she could remove it unless she was weight bearing.   Evaluation: Arrives to the clinic  with a hinged knee brace and B axillary crutches S/P R ACL. Patient had to have surgery following an incident of fall in July 2023. However, patient had a blood clot so they treated the blood clot first before she had the ACL surgery. Patient is currently taking ASA for the blood  clot. Per patient, patient is WBAT with the brace on. Patient was advised by surgeon to put ice 3x/day for 30 minutes and is helping. Patient is now referred to outpatient PT evaluation and management.  PERTINENT HISTORY: Dizziness, blood clots PAIN:  Are you having pain? Yes: NPRS scale: 4/10 Pain location: in front of the R knee Pain description: pulsating Aggravating factors: moving the knee, walking Relieving factors: rest  PRECAUTIONS: Other: Can take the  brace when laying down. Needs to wear the brace locked in ext for 4 weeks when upright  WEIGHT BEARING RESTRICTIONS: Yes WBAT  FALLS:  Has patient fallen in last 6 months? No  LIVING ENVIRONMENT: Lives with: lives with their family Lives in: Mobile home Stairs: Yes: External: 7 steps; can reach both Has following equipment at home: Crutches  OCCUPATION: in food preparation  PLOF: Independent  PATIENT GOALS: "to walk perfectly so I can get back to work"  NEXT MD VISIT: October 14, 2022  OBJECTIVE:   DIAGNOSTIC FINDINGS:  MRI OF THE RIGHT KNEE WITHOUT CONTRAST 03/04/2022   TECHNIQUE: Multiplanar, multisequence MR imaging of the right was performed. No intravenous contrast was administered.   COMPARISON:  Radiographs dated January 06, 2022 views of the ladder physical apical alignment disc shaft   FINDINGS: MENISCI   Medial: Intact.   Lateral: Radial tear of the posterior horn of the lateral meniscus.   LIGAMENTS   Cruciates: PCL is intact. Thickening and edema of the ACL without evidence of tear suggesting ligamentous sprain.   Collaterals: Thickening and edema of the medial collateral ligament suggesting partial-thickness tear, likely chronic process. Lateral collateral ligament complex is intact.   CARTILAGE   Patellofemoral:  No chondral defect.   Medial: Mild partial-thickness cartilage loss of the medial femorotibial compartment.   Lateral:  No chondral defect.   JOINT: Moderate joint effusion. Normal Hoffa's fat-pad. No plical thickening.   POPLITEAL FOSSA: Popliteus tendon is intact. No Baker's cyst.   EXTENSOR MECHANISM: Intact quadriceps tendon. Mild patellar tendinosis without tear. Intact lateral patellar retinaculum. Intact medial patellar retinaculum. Intact MPFL.   BONES: No aggressive osseous lesion. No fracture or dislocation.   Other: No fluid collection or hematoma. Muscles are normal.   IMPRESSION: 1.  Radial tear of the posterior  horn of the lateral meniscus.   2. Thickening/partial-thickness tear of the medial collateral ligament,, likely a chronic process.   2. Edema and thickening of the anterior cruciate ligament without evidence of discrete tear suggesting chronic ligamentous sprain.   3.  Moderate knee joint effusion.   4.  No evidence of fracture or osteonecrosis.  PATIENT SURVEYS:  FOTO 23.08  COGNITION: Overall cognitive status: Within functional limits for tasks assessed     SENSATION: Not tested  EDEMA:  Slight swelling on R knee. Bandage present without drainage. Knee brace locked in extension  MUSCLE LENGTH: Moderate restriction on B hamstrings Mild restriction on B hamstrings  POSTURE: rounded shoulders and forward head  PALPATION: Grade 1 tenderness on major bony landmarks on the R knee  LOWER EXTREMITY ROM:  Active ROM Right eval Left eval Right 09/29/22  Hip flexion [facility]   Hip extension [facility]   Hip abduction [facility]   Hip adduction     Hip internal rotation     Hip external rotation     Knee flexion 20 WFL 77  Knee extension 0 WFL 0  Ankle dorsiflexion [facility]   Ankle plantarflexion [facility] WFL   Ankle inversion  Ankle eversion      (Blank rows = not tested)  LOWER EXTREMITY MMT:  MMT Right eval Left eval  Hip flexion 2- 5  Hip extension 3+ 5  Hip abduction 3+ 5  Hip adduction    Hip internal rotation    Hip external rotation    Knee flexion  5  Knee extension  5  Ankle dorsiflexion 4 5  Ankle plantarflexion 4 5  Ankle inversion    Ankle eversion     (Blank rows = not tested)    FUNCTIONAL TESTS:  2 minute walk test: 216 ft done with knee brace and B axillary crutches  GAIT: Distance walked: 216 ft Assistive device utilized: Crutches Level of assistance: Complete Independence Comments: Modified 3-point pattern   TODAY'S TREATMENT:                                                                                                                               DATE:  09/29/22 Supine quad sets 10X5"  Heelslides 10X (ROM 77 degrees)  SLR 5X AAROM, 5X AROM Side lying hip abduction Rt 10X  Hip adduction Rt 10X Prone hip extension 10X  Knee flexion 10X AROM today supine 0-77 Rt knee  09/18/2022 Evaluation and patient education done R Heel slides with strap x 10 R quads sets x 6" x 10    PATIENT EDUCATION:  Education details: Educated on the goals and course of rehab. Written HEP provided and reviewed Person educated: Patient Education method: Explanation, Demonstration, and Handouts Education comprehension: verbalized understanding and returned demonstration  HOME EXERCISE PROGRAM: Access Code: [email or web address] URL: [email or web address]  Date: 09/29/2022 Prepared by: Emeline Gins Exercises - Active Straight Leg Raise with Quad Set  - 2 x daily - 7 x weekly - 3 sets - 10 reps - Sidelying Hip Abduction  - 2 x daily - 7 x weekly - 3 sets - 10 reps - Sidelying Hip Adduction  - 3 x daily - 7 x weekly - 3 sets - 10 reps - Prone Hip Extension  - 3 x daily - 7 x weekly - 3 sets - 10 reps - Prone Knee Flexion AROM  - 3 x daily - 7 x weekly - 3 sets - 10 reps  Date: 09/18/2022 Prepared by: Krystal Clark Exercises - Supine Heel Slide with Strap  - 2 x daily - 7 x weekly - 3 sets - 10 reps - Supine Quad Set  - 2 x daily - 7 x weekly - 3 sets - 10 reps - 6 hold  ASSESSMENT:  CLINICAL IMPRESSION: PT S/P 1.5 weeks since ACL surgery.  PT with interpreter.  Able to recall established HEP with additional exercises added this session per protocol. Pt able to complete SLR today without extension lag and increase AROM to 77 degrees.  Pt continues to walk with brace locked at 0 degrees and with crutches.  Pt is now sleeping and sitting without brace (as states  MD said was OK).  Updated HEP to include hip strengthening added this session.  Pt will continue to benefit from skilled therapy following MD's ACL protocol to meet goals and  reduce functional limitations.  OBJECTIVE IMPAIRMENTS: Abnormal gait, decreased activity tolerance, decreased balance, difficulty walking, decreased ROM, decreased strength, impaired flexibility, and pain.   ACTIVITY LIMITATIONS: carrying, lifting, bending, sitting, squatting, sleeping, stairs, transfers, bed mobility, bathing, toileting, dressing, and hygiene/grooming  PARTICIPATION LIMITATIONS: meal prep, cleaning, laundry, driving, shopping, community activity, and occupation  PERSONAL FACTORS: Time since onset of injury/illness/exacerbation are also affecting patient's functional outcome.   REHAB POTENTIAL: Good  CLINICAL DECISION MAKING: Stable/uncomplicated  EVALUATION COMPLEXITY: Low   GOALS: Goals reviewed with patient? Yes  SHORT TERM GOALS: Target date: 10/09/2022 Pt will demonstrate indep in HEP to facilitate carry-over of skilled services and improve functional outcomes Goal status: INITIAL  2.  Patient will demonstrate increase in knee flex ROM to 120 degrees to facilitate ease in ambulation Baseline: 20 degrees Goal status: INITIAL   LONG TERM GOALS: Target date: 10/30/2022  Pt will increase by at least 40 ft without assistive device in order to demonstrate clinically significant improvement in community ambulation Baseline: 216 ft Goal status: INITIAL  2.  Pt will increase FOTO to the predicted pattern in order to demonstrate significant improvement in function related to ambulation  Baseline: 23.08 Goal status: INITIAL  3.  Pt will demonstrate increase in LE strength to 5 to facilitate ease and safety in ambulation  Baseline: 2-/5 Goal status: INITIAL  4.  Pt will be able to perform SLS on the R to 15 sec (firm surface, eyes open( to facilitate ease and safety and ambulation Baseline: not performed Goal status: INITIAL  PLAN:  PT FREQUENCY: 3x/week  PT DURATION: 6 weeks  PLANNED INTERVENTIONS: Therapeutic exercises, Therapeutic activity,  Neuromuscular re-education, Balance training, Gait training, Patient/Family education, Self Care, Stair training, and Manual therapy  PLAN FOR NEXT SESSION: Follow per ACL protocol.      Lurena Nida, PTA/CLT [facility] Ph: [phone]  09/29/2022, 12:56 PM

## 2022-10-01 ENCOUNTER — Ambulatory Visit (HOSPITAL_COMMUNITY): Payer: Self-pay | Admitting: Physical Therapy

## 2022-10-01 ENCOUNTER — Telehealth: Payer: Self-pay | Admitting: Orthopedic Surgery

## 2022-10-01 DIAGNOSIS — R29898 Other symptoms and signs involving the musculoskeletal system: Secondary | ICD-10-CM

## 2022-10-01 DIAGNOSIS — R262 Difficulty in walking, not elsewhere classified: Secondary | ICD-10-CM

## 2022-10-01 DIAGNOSIS — M25561 Pain in right knee: Secondary | ICD-10-CM

## 2022-10-01 NOTE — Telephone Encounter (Signed)
Dr. Mort Sawyers pt - pt presented to the office and stated that her brother is in the hospital in [place] and she will be going to see him and will be gone for about 2 weeks.  She stated that PT told her she could change her appointments, but she wants Dr. Romeo Apple to know that she's going to miss PT for two weeks and she wants to know his opinion about this and how it will affect her.  She would like a call back [phone].

## 2022-10-01 NOTE — Telephone Encounter (Signed)
I called her to advise and she voiced understanding but there  is a language barrier She states she will call therapy to [facility] and call us to [facility] her appointment here.

## 2022-10-01 NOTE — Telephone Encounter (Signed)
Tell her to do knee range of motion exercises and quad sets until she gets back

## 2022-10-01 NOTE — Therapy (Signed)
OUTPATIENT PHYSICAL THERAPY TREATMENT   Patient Name: Kristine FraiseClarivel Bravo Adams MRN: [id number] DOB:1986/05/20, 37 y.o., female Today's Date: 10/01/2022  END OF SESSION:  PT End of Session - 10/01/22 1105     Visit Number 3    Number of Visits 16    Date for PT Re-Evaluation 10/30/22    Authorization Type 50% [facility] [employer]    PT Start Time 1042    PT Stop Time 1115    PT Time Calculation (min) 33 min    Activity Tolerance Patient tolerated treatment well    Behavior During Therapy WFL for tasks assessed/performed            Past Medical History:  Diagnosis Date   Anemia    Hyperlipidemia    No pertinent past medical history    Past Surgical History:  Procedure Laterality Date   ANTERIOR CRUCIATE LIGAMENT REPAIR Right 09/08/2022   Procedure: ANTERIOR CRUCIATE LIGAMENT (ACL) REPAIR WITH QUADRICEPS AUTOGRAFT;  Surgeon: Vickki HearingHarrison, Stanley E, MD;  Location: [facility];  Service: Orthopedics;  Laterality: Right;   BREAST LUMPECTOMY WITH RADIOACTIVE SEED LOCALIZATION Right 07/23/2020   Procedure: RIGHT BREAST LUMPECTOMYX 2  WITH RADIOACTIVE SEED LOCALIZATION;  Surgeon: Harriette Bouillonornett, Thomas, MD;  Location: [facility];  Service: General;  Laterality: Right;   CESAREAN SECTION     CESAREAN SECTION WITH BILATERAL TUBAL LIGATION  07/11/2012   Procedure: CESAREAN SECTION WITH BILATERAL TUBAL LIGATION;  Surgeon: Tilda BurrowJohn V Ferguson, MD;  Location: [facility];  Service: Obstetrics;  Laterality: Bilateral;  speaks spanish, interpreter needed   KNEE ARTHROSCOPY WITH MEDIAL MENISECTOMY Right 09/08/2022   Procedure: KNEE ARTHROSCOPY EXAM UNDER ANESTHESIA;  Surgeon: Vickki HearingHarrison, Stanley E, MD;  Location: [facility];  Service: Orthopedics;  Laterality: Right;   TUBAL LIGATION     Patient Active Problem List   Diagnosis Date Noted   Rupture of anterior cruciate ligament of right knee 09/08/2022   Right shoulder pain 05/12/2022   History of DVT of lower extremity 05/12/2022   Encounter for  gynecological examination with Papanicolaou smear of cervix 05/12/2022   Right knee pain 05/12/2022   Dyspareunia, female 05/12/2022   DVT (deep venous thrombosis) 03/16/2022   Itching of vulva 07/28/2021   Screening breast examination 01/24/2019   Left breast mass 01/24/2019   Menorrhagia with regular cycle 09/21/2018    PCP: Reather ConverseMassenburg, O'Laf, PA-C  REFERRING PROVIDER: Vickki HearingHarrison, Stanley E, MD  REFERRING DIAG: [phone] (ICD-10-CM) - S/P ACL reconstruction  THERAPY DIAG:  Right knee pain, unspecified chronicity  Difficulty walking  Weakness of right lower extremity  Rationale for Evaluation and Treatment: Rehabilitation  ONSET DATE: 09/08/2022  SUBJECTIVE:   SUBJECTIVE STATEMENT: Pt comes with interpreter Marena Chancy(Bianca) again today.  Reports no pain or issues.  No soreness after last session.  Completing her HEP 2X daily.   Evaluation: Arrives to the clinic  with a hinged knee brace and B axillary crutches S/P R ACL. Patient had to have surgery following an incident of fall in July 2023. However, patient had a blood clot so they treated the blood clot first before she had the ACL surgery. Patient is currently taking ASA for the blood clot. Per patient, patient is WBAT with the brace on. Patient was advised by surgeon to put ice 3x/day for 30 minutes and is helping. Patient is now referred to outpatient PT evaluation and management.  PERTINENT HISTORY: Dizziness, blood clots PAIN:  Are you having pain? Yes: NPRS scale: 0/10 Pain location: in front of the R  knee Pain description: pulsating Aggravating factors: moving the knee, walking Relieving factors: rest  PRECAUTIONS: Other: Can take the brace when laying down. Needs to wear the brace locked in ext for 4 weeks when upright  WEIGHT BEARING RESTRICTIONS: Yes WBAT  FALLS:  Has patient fallen in last 6 months? No  LIVING ENVIRONMENT: Lives with: lives with their family Lives in: Mobile home Stairs: Yes: External: 7 steps;  can reach both Has following equipment at home: Crutches  OCCUPATION: in food preparation  PLOF: Independent  PATIENT GOALS: "to walk perfectly so I can get back to work"  NEXT MD VISIT: October 14, 2022  OBJECTIVE:   DIAGNOSTIC FINDINGS:  MRI OF THE RIGHT KNEE WITHOUT CONTRAST 03/04/2022   TECHNIQUE: Multiplanar, multisequence MR imaging of the right was performed. No intravenous contrast was administered.   COMPARISON:  Radiographs dated January 06, 2022 views of the ladder physical apical alignment disc shaft   FINDINGS: MENISCI   Medial: Intact.   Lateral: Radial tear of the posterior horn of the lateral meniscus.   LIGAMENTS   Cruciates: PCL is intact. Thickening and edema of the ACL without evidence of tear suggesting ligamentous sprain.   Collaterals: Thickening and edema of the medial collateral ligament suggesting partial-thickness tear, likely chronic process. Lateral collateral ligament complex is intact.   CARTILAGE   Patellofemoral:  No chondral defect.   Medial: Mild partial-thickness cartilage loss of the medial femorotibial compartment.   Lateral:  No chondral defect.   JOINT: Moderate joint effusion. Normal Hoffa's fat-pad. No plical thickening.   POPLITEAL FOSSA: Popliteus tendon is intact. No Baker's cyst.   EXTENSOR MECHANISM: Intact quadriceps tendon. Mild patellar tendinosis without tear. Intact lateral patellar retinaculum. Intact medial patellar retinaculum. Intact MPFL.   BONES: No aggressive osseous lesion. No fracture or dislocation.   Other: No fluid collection or hematoma. Muscles are normal.   IMPRESSION: 1.  Radial tear of the posterior horn of the lateral meniscus.   2. Thickening/partial-thickness tear of the medial collateral ligament,, likely a chronic process.   2. Edema and thickening of the anterior cruciate ligament without evidence of discrete tear suggesting chronic ligamentous sprain.   3.  Moderate knee joint  effusion.   4.  No evidence of fracture or osteonecrosis.  PATIENT SURVEYS:  FOTO 23.08  COGNITION: Overall cognitive status: Within functional limits for tasks assessed     SENSATION: Not tested  EDEMA:  Slight swelling on R knee. Bandage present without drainage. Knee brace locked in extension  MUSCLE LENGTH: Moderate restriction on B hamstrings Mild restriction on B hamstrings  POSTURE: rounded shoulders and forward head  PALPATION: Grade 1 tenderness on major bony landmarks on the R knee  LOWER EXTREMITY ROM:  Active ROM Right eval Left eval Right 09/29/22  Hip flexion [facility]   Hip extension [facility]   Hip abduction [facility]   Hip adduction     Hip internal rotation     Hip external rotation     Knee flexion 20 WFL 77  Knee extension 0 WFL 0  Ankle dorsiflexion [facility]   Ankle plantarflexion [facility] WFL   Ankle inversion     Ankle eversion      (Blank rows = not tested)  LOWER EXTREMITY MMT:  MMT Right eval Left eval  Hip flexion 2- 5  Hip extension 3+ 5  Hip abduction 3+ 5  Hip adduction    Hip internal rotation    Hip external rotation  Knee flexion  5  Knee extension  5  Ankle dorsiflexion 4 5  Ankle plantarflexion 4 5  Ankle inversion    Ankle eversion     (Blank rows = not tested)    FUNCTIONAL TESTS:  2 minute walk test: 216 ft done with knee brace and B axillary crutches  GAIT: Distance walked: 216 ft Assistive device utilized: Crutches Level of assistance: Complete Independence Comments: Modified 3-point pattern   TODAY'S TREATMENT:                                                                                                                              DATE:  10/01/22 Supine Heelslides 10X   SLR 10X AROM 2 sets  Bridge 10X Side lying hip abduction Rt 10X 2 sets  Hip adduction Rt 10X X 2 sets Prone hip extension 10X 2 sets  Knee flexion 10X 2 sets Standing:  gait training with 1 crutch and brace locked at 30  degrees.   09/29/22 Supine quad sets 10X5"  Heelslides 10X (ROM 77 degrees)  SLR 5X AAROM, 5X AROM Side lying hip abduction Rt 10X  Hip adduction Rt 10X Prone hip extension 10X  Knee flexion 10X AROM today supine 0-77 Rt knee  09/18/2022 Evaluation and patient education done R Heel slides with strap x 10 R quads sets x 6" x 10    PATIENT EDUCATION:  Education details: Educated on the goals and course of rehab. Written HEP provided and reviewed Person educated: Patient Education method: Explanation, Demonstration, and Handouts Education comprehension: verbalized understanding and returned demonstration  HOME EXERCISE PROGRAM: Access Code: [email or web address] URL: [email or web address]  Date: 09/29/2022 Prepared by: Emeline Gins Exercises - Active Straight Leg Raise with Quad Set  - 2 x daily - 7 x weekly - 3 sets - 10 reps - Sidelying Hip Abduction  - 2 x daily - 7 x weekly - 3 sets - 10 reps - Sidelying Hip Adduction  - 3 x daily - 7 x weekly - 3 sets - 10 reps - Prone Hip Extension  - 3 x daily - 7 x weekly - 3 sets - 10 reps - Prone Knee Flexion AROM  - 3 x daily - 7 x weekly - 3 sets - 10 reps  Date: 09/18/2022 Prepared by: Krystal Clark Exercises - Supine Heel Slide with Strap  - 2 x daily - 7 x weekly - 3 sets - 10 reps - Supine Quad Set  - 2 x daily - 7 x weekly - 3 sets - 10 reps - 6 hold  ASSESSMENT:  CLINICAL IMPRESSION: PT S/P 1.5 weeks since ACL surgery.  PT with interpreter, arrived late to appt today.   Pt with improving strength/ROM per progress made thus far.  Instructed with gait using 1 crutch and brace to bend to 30 degrees for improved gait quality.  Pt able to complete without pain or issues.  Completed 2 sets of most exercises today.  Added bridge today as well as standing knee flexion per protocol.  PT given copy of protocol.  Pt with questions regarding her bandage and traveling to visit her sick brother.  Instructed to contact MD to get  clearance on these issues.  Pt will continue to benefit from skilled therapy following MD's ACL protocol to meet goals and reduce functional limitations.  OBJECTIVE IMPAIRMENTS: Abnormal gait, decreased activity tolerance, decreased balance, difficulty walking, decreased ROM, decreased strength, impaired flexibility, and pain.   ACTIVITY LIMITATIONS: carrying, lifting, bending, sitting, squatting, sleeping, stairs, transfers, bed mobility, bathing, toileting, dressing, and hygiene/grooming  PARTICIPATION LIMITATIONS: meal prep, cleaning, laundry, driving, shopping, community activity, and occupation  PERSONAL FACTORS: Time since onset of injury/illness/exacerbation are also affecting patient's functional outcome.   REHAB POTENTIAL: Good  CLINICAL DECISION MAKING: Stable/uncomplicated  EVALUATION COMPLEXITY: Low   GOALS: Goals reviewed with patient? Yes  SHORT TERM GOALS: Target date: 10/09/2022 Pt will demonstrate indep in HEP to facilitate carry-over of skilled services and improve functional outcomes Goal status: INITIAL  2.  Patient will demonstrate increase in knee flex ROM to 120 degrees to facilitate ease in ambulation Baseline: 20 degrees Goal status: INITIAL   LONG TERM GOALS: Target date: 10/30/2022  Pt will increase by at least 40 ft without assistive device in order to demonstrate clinically significant improvement in community ambulation Baseline: 216 ft Goal status: INITIAL  2.  Pt will increase FOTO to the predicted pattern in order to demonstrate significant improvement in function related to ambulation  Baseline: 23.08 Goal status: INITIAL  3.  Pt will demonstrate increase in LE strength to 5 to facilitate ease and safety in ambulation  Baseline: 2-/5 Goal status: INITIAL  4.  Pt will be able to perform SLS on the R to 15 sec (firm surface, eyes open( to facilitate ease and safety and ambulation Baseline: not performed Goal status:  INITIAL  PLAN:  PT FREQUENCY: 3x/week  PT DURATION: 6 weeks  PLANNED INTERVENTIONS: Therapeutic exercises, Therapeutic activity, Neuromuscular re-education, Balance training, Gait training, Patient/Family education, Self Care, Stair training, and Manual therapy  PLAN FOR NEXT SESSION: Follow per ACL protocol.  Begin stage II next session including addition of bike.  Measure ROM next session with goal of 120 degrees by week 4.    Lurena Nida, PTA/CLT [facility] Ph: [phone]  10/01/2022, 11:06 AM

## 2022-10-01 NOTE — Telephone Encounter (Signed)
I will call her, not good to miss therapy she should go as soon as she gets back

## 2022-10-02 ENCOUNTER — Ambulatory Visit (HOSPITAL_COMMUNITY): Payer: Self-pay

## 2022-10-02 DIAGNOSIS — R262 Difficulty in walking, not elsewhere classified: Secondary | ICD-10-CM

## 2022-10-02 DIAGNOSIS — M25561 Pain in right knee: Secondary | ICD-10-CM

## 2022-10-02 DIAGNOSIS — R29898 Other symptoms and signs involving the musculoskeletal system: Secondary | ICD-10-CM

## 2022-10-02 NOTE — Therapy (Addendum)
OUTPATIENT PHYSICAL THERAPY TREATMENT   Patient Name: Kristine Adams MRN: [id number] DOB:09/09/1985, 37 y.o., female Today's Date: 10/02/2022  END OF SESSION:  PT End of Session - 10/02/22 1047     Visit Number 4    Number of Visits 16    Date for PT Re-Evaluation 10/30/22    Authorization Type 50% [facility] [employer]    PT Start Time 1030    PT Stop Time 1110    PT Time Calculation (min) 40 min    Activity Tolerance Patient tolerated treatment well    Behavior During Therapy WFL for tasks assessed/performed            Past Medical History:  Diagnosis Date   Anemia    Hyperlipidemia    No pertinent past medical history    Past Surgical History:  Procedure Laterality Date   ANTERIOR CRUCIATE LIGAMENT REPAIR Right 09/08/2022   Procedure: ANTERIOR CRUCIATE LIGAMENT (ACL) REPAIR WITH QUADRICEPS AUTOGRAFT;  Surgeon: Vickki Hearing, MD;  Location: [facility];  Service: Orthopedics;  Laterality: Right;   BREAST LUMPECTOMY WITH RADIOACTIVE SEED LOCALIZATION Right 07/23/2020   Procedure: RIGHT BREAST LUMPECTOMYX 2  WITH RADIOACTIVE SEED LOCALIZATION;  Surgeon: Harriette Bouillon, MD;  Location: [facility];  Service: General;  Laterality: Right;   CESAREAN SECTION     CESAREAN SECTION WITH BILATERAL TUBAL LIGATION  07/11/2012   Procedure: CESAREAN SECTION WITH BILATERAL TUBAL LIGATION;  Surgeon: Tilda Burrow, MD;  Location: [facility];  Service: Obstetrics;  Laterality: Bilateral;  speaks spanish, interpreter needed   KNEE ARTHROSCOPY WITH MEDIAL MENISECTOMY Right 09/08/2022   Procedure: KNEE ARTHROSCOPY EXAM UNDER ANESTHESIA;  Surgeon: Vickki Hearing, MD;  Location: [facility];  Service: Orthopedics;  Laterality: Right;   TUBAL LIGATION     Patient Active Problem List   Diagnosis Date Noted   Rupture of anterior cruciate ligament of right knee 09/08/2022   Right shoulder pain 05/12/2022   History of DVT of lower extremity 05/12/2022   Encounter for  gynecological examination with Papanicolaou smear of cervix 05/12/2022   Right knee pain 05/12/2022   Dyspareunia, female 05/12/2022   DVT (deep venous thrombosis) 03/16/2022   Itching of vulva 07/28/2021   Screening breast examination 01/24/2019   Left breast mass 01/24/2019   Menorrhagia with regular cycle 09/21/2018    PCP: Reather Converse, PA-C  REFERRING PROVIDER: Vickki Hearing, MD  REFERRING DIAG: [phone] (ICD-10-CM) - S/P ACL reconstruction  THERAPY DIAG:  Right knee pain, unspecified chronicity  Difficulty walking  Weakness of right lower extremity  Rationale for Evaluation and Treatment: Rehabilitation  ONSET DATE: 09/08/2022  SUBJECTIVE:   SUBJECTIVE STATEMENT: Pt comes with interpreter Marena Chancy) today. Patient denies any pain today. Did not get sore after the last session. Patient has been doing her HEP without issues. Patient will be travelling and will be out of state for 2 weeks. Patient reports that she goes up/down stairs with her L LE leading.  Evaluation: Arrives to the clinic  with a hinged knee brace and B axillary crutches S/P R ACL. Patient had to have surgery following an incident of fall in July 2023. However, patient had a blood clot so they treated the blood clot first before she had the ACL surgery. Patient is currently taking ASA for the blood clot. Per patient, patient is WBAT with the brace on. Patient was advised by surgeon to put ice 3x/day for 30 minutes and is helping. Patient is now referred to outpatient PT  evaluation and management.  PERTINENT HISTORY: Dizziness, blood clots PAIN:  Are you having pain? Yes: NPRS scale: 0/10 Pain location: in front of the R knee Pain description: pulsating Aggravating factors: moving the knee, walking Relieving factors: rest  PRECAUTIONS: Other: Can take the brace when laying down. Needs to wear the brace locked in ext for 4 weeks when upright  WEIGHT BEARING RESTRICTIONS: Yes WBAT  FALLS:  Has  patient fallen in last 6 months? No  LIVING ENVIRONMENT: Lives with: lives with their family Lives in: Mobile home Stairs: Yes: External: 7 steps; can reach both Has following equipment at home: Crutches  OCCUPATION: in food preparation  PLOF: Independent  PATIENT GOALS: "to walk perfectly so I can get back to work"  NEXT MD VISIT: October 14, 2022  OBJECTIVE:   DIAGNOSTIC FINDINGS:  MRI OF THE RIGHT KNEE WITHOUT CONTRAST 03/04/2022   TECHNIQUE: Multiplanar, multisequence MR imaging of the right was performed. No intravenous contrast was administered.   COMPARISON:  Radiographs dated January 06, 2022 views of the ladder physical apical alignment disc shaft   FINDINGS: MENISCI   Medial: Intact.   Lateral: Radial tear of the posterior horn of the lateral meniscus.   LIGAMENTS   Cruciates: PCL is intact. Thickening and edema of the ACL without evidence of tear suggesting ligamentous sprain.   Collaterals: Thickening and edema of the medial collateral ligament suggesting partial-thickness tear, likely chronic process. Lateral collateral ligament complex is intact.   CARTILAGE   Patellofemoral:  No chondral defect.   Medial: Mild partial-thickness cartilage loss of the medial femorotibial compartment.   Lateral:  No chondral defect.   JOINT: Moderate joint effusion. Normal Hoffa's fat-pad. No plical thickening.   POPLITEAL FOSSA: Popliteus tendon is intact. No Baker's cyst.   EXTENSOR MECHANISM: Intact quadriceps tendon. Mild patellar tendinosis without tear. Intact lateral patellar retinaculum. Intact medial patellar retinaculum. Intact MPFL.   BONES: No aggressive osseous lesion. No fracture or dislocation.   Other: No fluid collection or hematoma. Muscles are normal.   IMPRESSION: 1.  Radial tear of the posterior horn of the lateral meniscus.   2. Thickening/partial-thickness tear of the medial collateral ligament,, likely a chronic process.   2. Edema  and thickening of the anterior cruciate ligament without evidence of discrete tear suggesting chronic ligamentous sprain.   3.  Moderate knee joint effusion.   4.  No evidence of fracture or osteonecrosis.  PATIENT SURVEYS:  FOTO 23.08  COGNITION: Overall cognitive status: Within functional limits for tasks assessed     SENSATION: Not tested  EDEMA:  Slight swelling on R knee. Bandage present without drainage. Knee brace locked in extension  MUSCLE LENGTH: Moderate restriction on B hamstrings Mild restriction on B hamstrings  POSTURE: rounded shoulders and forward head  PALPATION: Grade 1 tenderness on major bony landmarks on the R knee  LOWER EXTREMITY ROM:  Active ROM Right eval Left eval Right 09/29/22  Hip flexion [facility]   Hip extension [facility]   Hip abduction [facility]   Hip adduction     Hip internal rotation     Hip external rotation     Knee flexion 20 WFL 77  Knee extension 0 WFL 0  Ankle dorsiflexion [facility]   Ankle plantarflexion [facility] WFL   Ankle inversion     Ankle eversion      (Blank rows = not tested)  LOWER EXTREMITY MMT:  MMT Right eval Left eval  Hip flexion 2- 5  Hip  extension 3+ 5  Hip abduction 3+ 5  Hip adduction    Hip internal rotation    Hip external rotation    Knee flexion  5  Knee extension  5  Ankle dorsiflexion 4 5  Ankle plantarflexion 4 5  Ankle inversion    Ankle eversion     (Blank rows = not tested)    FUNCTIONAL TESTS:  2 minute walk test: 216 ft done with knee brace and B axillary crutches  GAIT: Distance walked: 216 ft Assistive device utilized: Crutches Level of assistance: Complete Independence Comments: Modified 3-point pattern   TODAY'S TREATMENT:                                                                                                                              DATE:  10/02/22 Supine  Heel slides with a strap x 10 x 3 with 30" hold on the last rep of flexion  SLR 3-ways (flex, abd, ext) x  10 x 2 Prone:  Hamstring curls with ankle resting on bolster x 10 x 2 Gait training on level surface, CGA-SBA, 1 crutch on L, 2-point pattern, 1 lap around the clinic Stair negotiation (4" and 7" step), 1 crutch on L and opposite UE on railing, CGA-SBA, 3 rounds on 7" steps, 1 round on 4" steps  10/01/22 Supine Heelslides 10X   SLR 10X AROM 2 sets  Bridge 10X Side lying hip abduction Rt 10X 2 sets  Hip adduction Rt 10X X 2 sets Prone hip extension 10X 2 sets  Knee flexion 10X 2 sets Standing:  gait training with 1 crutch and brace locked at 30 degrees.   09/29/22 Supine quad sets 10X5"  Heelslides 10X (ROM 77 degrees)  SLR 5X AAROM, 5X AROM Side lying hip abduction Rt 10X  Hip adduction Rt 10X Prone hip extension 10X  Knee flexion 10X AROM today supine 0-77 Rt knee  09/18/2022 Evaluation and patient education done R Heel slides with strap x 10 R quads sets x 6" x 10    PATIENT EDUCATION:  Education details: Educated on the goals and course of rehab. Written HEP provided and reviewed Person educated: Patient Education method: Explanation, Demonstration, and Handouts Education comprehension: verbalized understanding and returned demonstration  HOME EXERCISE PROGRAM: Access Code: [email or web address] URL: [email or web address]  Date: 09/29/2022 Prepared by: Emeline Gins Exercises - Active Straight Leg Raise with Quad Set  - 2 x daily - 7 x weekly - 3 sets - 10 reps - Sidelying Hip Abduction  - 2 x daily - 7 x weekly - 3 sets - 10 reps - Sidelying Hip Adduction  - 3 x daily - 7 x weekly - 3 sets - 10 reps - Prone Hip Extension  - 3 x daily - 7 x weekly - 3 sets - 10 reps - Prone Knee Flexion AROM  - 3 x daily - 7 x weekly - 3 sets - 10 reps  Date: 09/18/2022 Prepared by:  Krystal Clark Exercises - Supine Heel Slide with Strap  - 2 x daily - 7 x weekly - 3 sets - 10 reps - Supine Quad Set  - 2 x daily - 7 x weekly - 3 sets - 10 reps - 6 hold  ASSESSMENT:  CLINICAL  IMPRESSION: Interventions today were geared towards LE strengthening and flexibility based 2-4 weeks of the protocol as patient is on her 3rd week post op. Tolerated all activities without worsening of symptoms on most activities but demonstrates ext lag on SLR with pain = 4/10. Demonstrated appropriate levels of fatigue. Did not report of any pain during gait training and stair negotiation but reported of slight pain after stair negotiation. Gait training and stair negotiation done with the brace on. Provided mild amount of cueing to ensure correct execution of activity with good carry-over. Pacing of activities was slow. AROM of knee flex = 110 degrees. To date, skilled PT is required to address the impairments and improve function.   OBJECTIVE IMPAIRMENTS: Abnormal gait, decreased activity tolerance, decreased balance, difficulty walking, decreased ROM, decreased strength, impaired flexibility, and pain.   ACTIVITY LIMITATIONS: carrying, lifting, bending, sitting, squatting, sleeping, stairs, transfers, bed mobility, bathing, toileting, dressing, and hygiene/grooming  PARTICIPATION LIMITATIONS: meal prep, cleaning, laundry, driving, shopping, community activity, and occupation  PERSONAL FACTORS: Time since onset of injury/illness/exacerbation are also affecting patient's functional outcome.   REHAB POTENTIAL: Good  CLINICAL DECISION MAKING: Stable/uncomplicated  EVALUATION COMPLEXITY: Low   GOALS: Goals reviewed with patient? Yes  SHORT TERM GOALS: Target date: 10/09/2022 Pt will demonstrate indep in HEP to facilitate carry-over of skilled services and improve functional outcomes Goal status: INITIAL  2.  Patient will demonstrate increase in knee flex ROM to 120 degrees to facilitate ease in ambulation Baseline: 20 degrees Goal status: INITIAL   LONG TERM GOALS: Target date: 10/30/2022  Pt will increase by at least 40 ft without assistive device in order to demonstrate  clinically significant improvement in community ambulation Baseline: 216 ft Goal status: INITIAL  2.  Pt will increase FOTO to the predicted pattern in order to demonstrate significant improvement in function related to ambulation  Baseline: 23.08 Goal status: INITIAL  3.  Pt will demonstrate increase in LE strength to 5 to facilitate ease and safety in ambulation  Baseline: 2-/5 Goal status: INITIAL  4.  Pt will be able to perform SLS on the R to 15 sec (firm surface, eyes open( to facilitate ease and safety and ambulation Baseline: not performed Goal status: INITIAL  PLAN:  PT FREQUENCY: 3x/week  PT DURATION: 6 weeks  PLANNED INTERVENTIONS: Therapeutic exercises, Therapeutic activity, Neuromuscular re-education, Balance training, Gait training, Patient/Family education, Self Care, Stair training, and Manual therapy  PLAN FOR NEXT SESSION: Continue POC and may progress per protocol with emphasis on ROM and strength.  Tish Frederickson. Jamelyn Bovard, PT, DPT, OCS Board-Certified Clinical Specialist in Orthopedic PT PT Compact Privilege # ([place]): [id number] T 10/02/2022, 3:23 PM

## 2022-10-05 ENCOUNTER — Encounter (HOSPITAL_COMMUNITY): Payer: Self-pay | Admitting: Physical Therapy

## 2022-10-07 ENCOUNTER — Encounter (HOSPITAL_COMMUNITY): Payer: Self-pay | Admitting: Physical Therapy

## 2022-10-09 ENCOUNTER — Encounter (HOSPITAL_COMMUNITY): Payer: Self-pay

## 2022-10-12 ENCOUNTER — Encounter (HOSPITAL_COMMUNITY): Payer: Self-pay | Admitting: Physical Therapy

## 2022-10-14 ENCOUNTER — Encounter (HOSPITAL_COMMUNITY): Payer: Self-pay | Admitting: Physical Therapy

## 2022-10-15 ENCOUNTER — Ambulatory Visit (INDEPENDENT_AMBULATORY_CARE_PROVIDER_SITE_OTHER): Payer: Self-pay | Admitting: Orthopedic Surgery

## 2022-10-15 ENCOUNTER — Encounter: Payer: Self-pay | Admitting: Orthopedic Surgery

## 2022-10-15 DIAGNOSIS — Z9889 Other specified postprocedural states: Secondary | ICD-10-CM

## 2022-10-15 NOTE — Progress Notes (Signed)
   This is a postop visit  Encounter Diagnosis  Name Primary?   S/P ACL reconstruction 09/08/22 right Yes   Chief Complaint  Patient presents with   Post-op Follow-up    09/08/22 right ACL     The patient is status post ACL reconstruction right knee with quadriceps tendon autograft  This is postop day number 37  Postop pain control Tylenol ibuprofen  Subjective complaints none  Physical exam findings knee flexion angle 0 to 90 degrees  Assessment and plan  37 year old female status post ACL reconstruction with quadriceps tendon autograft doing well missed a couple therapy appointments secondary to her trip but she is back and can resume next week    Tension felt good she is lacking a little bit of extension her flexion angle is up to about 90 degrees.  Should be noted that she hyperextends actually on the left knee  Follow-up in 6 weeks continue brace and resume physical therapy

## 2022-10-16 ENCOUNTER — Encounter (HOSPITAL_COMMUNITY): Payer: Self-pay

## 2022-10-19 ENCOUNTER — Encounter (HOSPITAL_COMMUNITY): Payer: Self-pay | Admitting: Physical Therapy

## 2022-10-21 ENCOUNTER — Encounter (HOSPITAL_COMMUNITY): Payer: Self-pay

## 2022-10-23 ENCOUNTER — Encounter (HOSPITAL_COMMUNITY): Payer: Self-pay

## 2022-10-26 ENCOUNTER — Ambulatory Visit (HOSPITAL_COMMUNITY): Payer: Self-pay | Attending: Orthopedic Surgery

## 2022-10-26 DIAGNOSIS — R262 Difficulty in walking, not elsewhere classified: Secondary | ICD-10-CM

## 2022-10-26 DIAGNOSIS — R29898 Other symptoms and signs involving the musculoskeletal system: Secondary | ICD-10-CM

## 2022-10-26 DIAGNOSIS — M25561 Pain in right knee: Secondary | ICD-10-CM

## 2022-10-26 NOTE — Therapy (Addendum)
OUTPATIENT PHYSICAL THERAPY TREATMENT   Patient Name: Kristine Adams MRN: [id number] DOB:25-Jul-1985, 37 y.o., female Today's Date: 10/26/2022  END OF SESSION:  PT End of Session - 10/26/22 1348     Visit Number 5    Number of Visits 16    Date for PT Re-Evaluation 10/30/22    Authorization Type 50% [facility] [employer]    PT Start Time 1348    PT Stop Time 1428    PT Time Calculation (min) 40 min    Activity Tolerance Patient tolerated treatment well    Behavior During Therapy WFL for tasks assessed/performed            Past Medical History:  Diagnosis Date   Anemia    Hyperlipidemia    No pertinent past medical history    Past Surgical History:  Procedure Laterality Date   ANTERIOR CRUCIATE LIGAMENT REPAIR Right 09/08/2022   Procedure: ANTERIOR CRUCIATE LIGAMENT (ACL) REPAIR WITH QUADRICEPS AUTOGRAFT;  Surgeon: Vickki Hearing, MD;  Location: [facility];  Service: Orthopedics;  Laterality: Right;   BREAST LUMPECTOMY WITH RADIOACTIVE SEED LOCALIZATION Right 07/23/2020   Procedure: RIGHT BREAST LUMPECTOMYX 2  WITH RADIOACTIVE SEED LOCALIZATION;  Surgeon: Harriette Bouillon, MD;  Location: [facility];  Service: General;  Laterality: Right;   CESAREAN SECTION     CESAREAN SECTION WITH BILATERAL TUBAL LIGATION  07/11/2012   Procedure: CESAREAN SECTION WITH BILATERAL TUBAL LIGATION;  Surgeon: Tilda Burrow, MD;  Location: [facility];  Service: Obstetrics;  Laterality: Bilateral;  speaks spanish, interpreter needed   KNEE ARTHROSCOPY WITH MEDIAL MENISECTOMY Right 09/08/2022   Procedure: KNEE ARTHROSCOPY EXAM UNDER ANESTHESIA;  Surgeon: Vickki Hearing, MD;  Location: [facility];  Service: Orthopedics;  Laterality: Right;   TUBAL LIGATION     Patient Active Problem List   Diagnosis Date Noted   Rupture of anterior cruciate ligament of right knee 09/08/2022   Right shoulder pain 05/12/2022   History of DVT of lower extremity 05/12/2022   Encounter for  gynecological examination with Papanicolaou smear of cervix 05/12/2022   Right knee pain 05/12/2022   Dyspareunia, female 05/12/2022   DVT (deep venous thrombosis) (HCC) 03/16/2022   Itching of vulva 07/28/2021   Screening breast examination 01/24/2019   Left breast mass 01/24/2019   Menorrhagia with regular cycle 09/21/2018    PCP: Reather Converse, PA-C  REFERRING PROVIDER: Vickki Hearing, MD  REFERRING DIAG: [phone] (ICD-10-CM) - S/P ACL reconstruction  THERAPY DIAG:  Right knee pain, unspecified chronicity  Difficulty walking  Weakness of right lower extremity  Rationale for Evaluation and Treatment: Rehabilitation  ONSET DATE: 09/08/2022  SUBJECTIVE:   SUBJECTIVE STATEMENT: Pt comes with interpreter (sonya) today. No pain today; reports good MD appt   Evaluation: Arrives to the clinic  with a hinged knee brace and B axillary crutches S/P R ACL. Patient had to have surgery following an incident of fall in July 2023. However, patient had a blood clot so they treated the blood clot first before she had the ACL surgery. Patient is currently taking ASA for the blood clot. Per patient, patient is WBAT with the brace on. Patient was advised by surgeon to put ice 3x/day for 30 minutes and is helping. Patient is now referred to outpatient PT evaluation and management.  PERTINENT HISTORY: Dizziness, blood clots PAIN:  Are you having pain? Yes: NPRS scale: 0/10 Pain location: in front of the R knee Pain description: pulsating Aggravating factors: moving the knee, walking Relieving  factors: rest  PRECAUTIONS: Other: Can take the brace when laying down. Needs to wear the brace locked in ext for 4 weeks when upright  WEIGHT BEARING RESTRICTIONS: Yes WBAT  FALLS:  Has patient fallen in last 6 months? No  LIVING ENVIRONMENT: Lives with: lives with their family Lives in: Mobile home Stairs: Yes: External: 7 steps; can reach both Has following equipment at home:  Crutches  OCCUPATION: in food preparation  PLOF: Independent  PATIENT GOALS: "to walk perfectly so I can get back to work"  NEXT MD VISIT: 11/26/22  OBJECTIVE:   DIAGNOSTIC FINDINGS:  MRI OF THE RIGHT KNEE WITHOUT CONTRAST 03/04/2022   TECHNIQUE: Multiplanar, multisequence MR imaging of the right was performed. No intravenous contrast was administered.   COMPARISON:  Radiographs dated January 06, 2022 views of the ladder physical apical alignment disc shaft   FINDINGS: MENISCI   Medial: Intact.   Lateral: Radial tear of the posterior horn of the lateral meniscus.   LIGAMENTS   Cruciates: PCL is intact. Thickening and edema of the ACL without evidence of tear suggesting ligamentous sprain.   Collaterals: Thickening and edema of the medial collateral ligament suggesting partial-thickness tear, likely chronic process. Lateral collateral ligament complex is intact.   CARTILAGE   Patellofemoral:  No chondral defect.   Medial: Mild partial-thickness cartilage loss of the medial femorotibial compartment.   Lateral:  No chondral defect.   JOINT: Moderate joint effusion. Normal Hoffa's fat-pad. No plical thickening.   POPLITEAL FOSSA: Popliteus tendon is intact. No Baker's cyst.   EXTENSOR MECHANISM: Intact quadriceps tendon. Mild patellar tendinosis without tear. Intact lateral patellar retinaculum. Intact medial patellar retinaculum. Intact MPFL.   BONES: No aggressive osseous lesion. No fracture or dislocation.   Other: No fluid collection or hematoma. Muscles are normal.   IMPRESSION: 1.  Radial tear of the posterior horn of the lateral meniscus.   2. Thickening/partial-thickness tear of the medial collateral ligament,, likely a chronic process.   2. Edema and thickening of the anterior cruciate ligament without evidence of discrete tear suggesting chronic ligamentous sprain.   3.  Moderate knee joint effusion.   4.  No evidence of fracture or  osteonecrosis.  PATIENT SURVEYS:  FOTO 23.08  COGNITION: Overall cognitive status: Within functional limits for tasks assessed     SENSATION: Not tested  EDEMA:  Slight swelling on R knee. Bandage present without drainage. Knee brace locked in extension  MUSCLE LENGTH: Moderate restriction on B hamstrings Mild restriction on B hamstrings  POSTURE: rounded shoulders and forward head  PALPATION: Grade 1 tenderness on major bony landmarks on the R knee  LOWER EXTREMITY ROM:  Active ROM Right eval Left eval Right 09/29/22  Hip flexion [facility]   Hip extension [facility]   Hip abduction [facility]   Hip adduction     Hip internal rotation     Hip external rotation     Knee flexion 20 WFL 77  Knee extension 0 WFL 0  Ankle dorsiflexion [facility]   Ankle plantarflexion [facility] WFL   Ankle inversion     Ankle eversion      (Blank rows = not tested)  LOWER EXTREMITY MMT:  MMT Right eval Left eval  Hip flexion 2- 5  Hip extension 3+ 5  Hip abduction 3+ 5  Hip adduction    Hip internal rotation    Hip external rotation    Knee flexion  5  Knee extension  5  Ankle dorsiflexion 4  5  Ankle plantarflexion 4 5  Ankle inversion    Ankle eversion     (Blank rows = not tested)    FUNCTIONAL TESTS:  2 minute walk test: 216 ft done with knee brace and B axillary crutches  GAIT: Distance walked: 216 ft Assistive device utilized: Crutches Level of assistance: Complete Independence Comments: Modified 3-point pattern   TODAY'S TREATMENT:                                                                                                                              DATE:  10/26/22 Supine: Noted suture exposed left medial incision MD notified Heel slides with slight overpressure right knee flexion starting at 91; ending at 107 Quad sets 5 sec hold x 10 SLR 2 x 10  Bike seat 11 x 3' for mobility  Standing: Heel raises x 20 4" step ups x 15  Gait training with 1 crutch    10/02/22 Supine  Heel slides with a strap x 10 x 3 with 30" hold on the last rep of flexion  SLR 3-ways (flex, abd, ext) x 10 x 2 Prone:  Hamstring curls with ankle resting on bolster x 10 x 2 Gait training on level surface, CGA-SBA, 1 crutch on L, 2-point pattern, 1 lap around the clinic Stair negotiation (4" and 7" step), 1 crutch on L and opposite UE on railing, CGA-SBA, 3 rounds on 7" steps, 1 round on 4" steps  10/01/22 Supine Heelslides 10X   SLR 10X AROM 2 sets  Bridge 10X Side lying hip abduction Rt 10X 2 sets  Hip adduction Rt 10X X 2 sets Prone hip extension 10X 2 sets  Knee flexion 10X 2 sets Standing:  gait training with 1 crutch and brace locked at 30 degrees.   09/29/22 Supine quad sets 10X5"  Heelslides 10X (ROM 77 degrees)  SLR 5X AAROM, 5X AROM Side lying hip abduction Rt 10X  Hip adduction Rt 10X Prone hip extension 10X  Knee flexion 10X AROM today supine 0-77 Rt knee  09/18/2022 Evaluation and patient education done R Heel slides with strap x 10 R quads sets x 6" x 10    PATIENT EDUCATION:  Education details: Educated on the goals and course of rehab. Written HEP provided and reviewed Person educated: Patient Education method: Explanation, Demonstration, and Handouts Education comprehension: verbalized understanding and returned demonstration  HOME EXERCISE PROGRAM: Access Code: [email or web address] URL: [email or web address]  Date: 09/29/2022 Prepared by: Emeline Gins Exercises - Active Straight Leg Raise with Quad Set  - 2 x daily - 7 x weekly - 3 sets - 10 reps - Sidelying Hip Abduction  - 2 x daily - 7 x weekly - 3 sets - 10 reps - Sidelying Hip Adduction  - 3 x daily - 7 x weekly - 3 sets - 10 reps - Prone Hip Extension  - 3 x daily - 7 x weekly - 3 sets - 10 reps - Prone  Knee Flexion AROM  - 3 x daily - 7 x weekly - 3 sets - 10 reps  Date: 09/18/2022 Prepared by: Krystal Clark Exercises - Supine Heel Slide with Strap  - 2 x daily -  7 x weekly - 3 sets - 10 reps - Supine Quad Set  - 2 x daily - 7 x weekly - 3 sets - 10 reps - 6 hold  ASSESSMENT:  CLINICAL IMPRESSION: Patient now 6 weeks s/p; added bike today for mobility; patient able to make full revolutions.  Progressed to standing exercises and updated HEP with no issue; gait training with 1 crutch with cues for improved heel strike.  Patient with visible stitch; called and left message with MD to notify.  To date, skilled PT is required to address the impairments and improve function.   OBJECTIVE IMPAIRMENTS: Abnormal gait, decreased activity tolerance, decreased balance, difficulty walking, decreased ROM, decreased strength, impaired flexibility, and pain.   ACTIVITY LIMITATIONS: carrying, lifting, bending, sitting, squatting, sleeping, stairs, transfers, bed mobility, bathing, toileting, dressing, and hygiene/grooming  PARTICIPATION LIMITATIONS: meal prep, cleaning, laundry, driving, shopping, community activity, and occupation  PERSONAL FACTORS: Time since onset of injury/illness/exacerbation are also affecting patient's functional outcome.   REHAB POTENTIAL: Good  CLINICAL DECISION MAKING: Stable/uncomplicated  EVALUATION COMPLEXITY: Low   GOALS: Goals reviewed with patient? Yes  SHORT TERM GOALS: Target date: 10/09/2022 Pt will demonstrate indep in HEP to facilitate carry-over of skilled services and improve functional outcomes Goal status: IN PROGRESS  2.  Patient will demonstrate increase in knee flex ROM to 120 degrees to facilitate ease in ambulation Baseline: 20 degrees Goal status: IN PROGRESS   LONG TERM GOALS: Target date: 10/30/2022  Pt will increase by at least 40 ft without assistive device in order to demonstrate clinically significant improvement in community ambulation Baseline: 216 ft Goal status: IN PROGRESS  2.  Pt will increase FOTO to the predicted pattern in order to demonstrate significant improvement in function  related to ambulation  Baseline: 23.08 Goal status: IN PROGRESS  3.  Pt will demonstrate increase in LE strength to 5 to facilitate ease and safety in ambulation  Baseline: 2-/5 Goal status: IN PROGRESS  4.  Pt will be able to perform SLS on the R to 15 sec (firm surface, eyes open( to facilitate ease and safety and ambulation Baseline: not performed Goal status: IN PROGRESS  PLAN:  PT FREQUENCY: 3x/week  PT DURATION: 6 weeks  PLANNED INTERVENTIONS: Therapeutic exercises, Therapeutic activity, Neuromuscular re-education, Balance training, Gait training, Patient/Family education, Self Care, Stair training, and Manual therapy  PLAN FOR NEXT SESSION: Continue POC and may progress per protocol with emphasis on ROM and strength. Slant board; TKE's, hip abduction, lateral step ups  2:43 PM, 10/26/22 Aleese Kamps Small Euretha Najarro MPT [facility] [place] [phone] Ph:[phone]

## 2022-10-28 ENCOUNTER — Ambulatory Visit (HOSPITAL_COMMUNITY): Payer: Self-pay

## 2022-10-28 DIAGNOSIS — M25561 Pain in right knee: Secondary | ICD-10-CM

## 2022-10-28 DIAGNOSIS — R29898 Other symptoms and signs involving the musculoskeletal system: Secondary | ICD-10-CM

## 2022-10-28 DIAGNOSIS — R262 Difficulty in walking, not elsewhere classified: Secondary | ICD-10-CM

## 2022-10-28 NOTE — Therapy (Addendum)
OUTPATIENT PHYSICAL THERAPY TREATMENT/PROGRESS NOTE Progress Note Reporting Period 09/18/2022 to 10/28/2022  See note below for Objective Data and Assessment of Progress/Goals.       Patient Name: Kristine Adams MRN: [id number] DOB:Sep 09, 1985, 37 y.o., female Today's Date: 10/28/2022  END OF SESSION:  PT End of Session - 10/28/22 1305     Visit Number 6    Number of Visits 16    Date for PT Re-Evaluation 10/30/22    Authorization Type 50% [facility] [employer]    PT Start Time 1305    PT Stop Time 1345    PT Time Calculation (min) 40 min    Activity Tolerance Patient tolerated treatment well    Behavior During Therapy WFL for tasks assessed/performed            Past Medical History:  Diagnosis Date   Anemia    Hyperlipidemia    No pertinent past medical history    Past Surgical History:  Procedure Laterality Date   ANTERIOR CRUCIATE LIGAMENT REPAIR Right 09/08/2022   Procedure: ANTERIOR CRUCIATE LIGAMENT (ACL) REPAIR WITH QUADRICEPS AUTOGRAFT;  Surgeon: Vickki Hearing, MD;  Location: [facility];  Service: Orthopedics;  Laterality: Right;   BREAST LUMPECTOMY WITH RADIOACTIVE SEED LOCALIZATION Right 07/23/2020   Procedure: RIGHT BREAST LUMPECTOMYX 2  WITH RADIOACTIVE SEED LOCALIZATION;  Surgeon: Harriette Bouillon, MD;  Location: [facility];  Service: General;  Laterality: Right;   CESAREAN SECTION     CESAREAN SECTION WITH BILATERAL TUBAL LIGATION  07/11/2012   Procedure: CESAREAN SECTION WITH BILATERAL TUBAL LIGATION;  Surgeon: Tilda Burrow, MD;  Location: [facility];  Service: Obstetrics;  Laterality: Bilateral;  speaks spanish, interpreter needed   KNEE ARTHROSCOPY WITH MEDIAL MENISECTOMY Right 09/08/2022   Procedure: KNEE ARTHROSCOPY EXAM UNDER ANESTHESIA;  Surgeon: Vickki Hearing, MD;  Location: [facility];  Service: Orthopedics;  Laterality: Right;   TUBAL LIGATION     Patient Active Problem List   Diagnosis Date Noted   Rupture of  anterior cruciate ligament of right knee 09/08/2022   Right shoulder pain 05/12/2022   History of DVT of lower extremity 05/12/2022   Encounter for gynecological examination with Papanicolaou smear of cervix 05/12/2022   Right knee pain 05/12/2022   Dyspareunia, female 05/12/2022   DVT (deep venous thrombosis) (HCC) 03/16/2022   Itching of vulva 07/28/2021   Screening breast examination 01/24/2019   Left breast mass 01/24/2019   Menorrhagia with regular cycle 09/21/2018    PCP: Reather Converse, PA-C  REFERRING PROVIDER: Vickki Hearing, MD  REFERRING DIAG: [phone] (ICD-10-CM) - S/P ACL reconstruction  THERAPY DIAG:  Right knee pain, unspecified chronicity  Difficulty walking  Weakness of right lower extremity  Rationale for Evaluation and Treatment: Rehabilitation  ONSET DATE: 09/08/2022  SUBJECTIVE:   SUBJECTIVE STATEMENT: Pt comes with interpreter Mardene Celeste) today. No pain today; she is walking with her brace but without crutches today.     Evaluation: Arrives to the clinic  with a hinged knee brace and B axillary crutches S/P R ACL. Patient had to have surgery following an incident of fall in July 2023. However, patient had a blood clot so they treated the blood clot first before she had the ACL surgery. Patient is currently taking ASA for the blood clot. Per patient, patient is WBAT with the brace on. Patient was advised by surgeon to put ice 3x/day for 30 minutes and is helping. Patient is now referred to outpatient PT evaluation and management.  PERTINENT HISTORY:  Dizziness, blood clots PAIN:  Are you having pain? Yes: NPRS scale: 0/10 Pain location: in front of the R knee Pain description: pulsating Aggravating factors: moving the knee, walking Relieving factors: rest  PRECAUTIONS: Other: Can take the brace when laying down. Needs to wear the brace locked in ext for 4 weeks when upright  WEIGHT BEARING RESTRICTIONS: Yes WBAT  FALLS:  Has patient fallen in  last 6 months? No  LIVING ENVIRONMENT: Lives with: lives with their family Lives in: Mobile home Stairs: Yes: External: 7 steps; can reach both Has following equipment at home: Crutches  OCCUPATION: in food preparation  PLOF: Independent  PATIENT GOALS: "to walk perfectly so I can get back to work"  NEXT MD VISIT: 11/26/22  OBJECTIVE:   DIAGNOSTIC FINDINGS:  MRI OF THE RIGHT KNEE WITHOUT CONTRAST 03/04/2022   TECHNIQUE: Multiplanar, multisequence MR imaging of the right was performed. No intravenous contrast was administered.   COMPARISON:  Radiographs dated January 06, 2022 views of the ladder physical apical alignment disc shaft   FINDINGS: MENISCI   Medial: Intact.   Lateral: Radial tear of the posterior horn of the lateral meniscus.   LIGAMENTS   Cruciates: PCL is intact. Thickening and edema of the ACL without evidence of tear suggesting ligamentous sprain.   Collaterals: Thickening and edema of the medial collateral ligament suggesting partial-thickness tear, likely chronic process. Lateral collateral ligament complex is intact.   CARTILAGE   Patellofemoral:  No chondral defect.   Medial: Mild partial-thickness cartilage loss of the medial femorotibial compartment.   Lateral:  No chondral defect.   JOINT: Moderate joint effusion. Normal Hoffa's fat-pad. No plical thickening.   POPLITEAL FOSSA: Popliteus tendon is intact. No Baker's cyst.   EXTENSOR MECHANISM: Intact quadriceps tendon. Mild patellar tendinosis without tear. Intact lateral patellar retinaculum. Intact medial patellar retinaculum. Intact MPFL.   BONES: No aggressive osseous lesion. No fracture or dislocation.   Other: No fluid collection or hematoma. Muscles are normal.   IMPRESSION: 1.  Radial tear of the posterior horn of the lateral meniscus.   2. Thickening/partial-thickness tear of the medial collateral ligament,, likely a chronic process.   2. Edema and thickening of the  anterior cruciate ligament without evidence of discrete tear suggesting chronic ligamentous sprain.   3.  Moderate knee joint effusion.   4.  No evidence of fracture or osteonecrosis.  PATIENT SURVEYS:  FOTO 23.08  COGNITION: Overall cognitive status: Within functional limits for tasks assessed     SENSATION: Not tested  EDEMA:  Slight swelling on R knee. Bandage present without drainage. Knee brace locked in extension  MUSCLE LENGTH: Moderate restriction on B hamstrings Mild restriction on B hamstrings  POSTURE: rounded shoulders and forward head  PALPATION: Grade 1 tenderness on major bony landmarks on the R knee  LOWER EXTREMITY ROM:  Active ROM Right eval Left eval Right 09/29/22 Right 10/28/22  Hip flexion [facility]    Hip extension [facility]    Hip abduction [facility]    Hip adduction      Hip internal rotation      Hip external rotation      Knee flexion 20 WFL 77 115  Knee extension 0 WFL 0 0 (left knee hyperextends)  Ankle dorsiflexion [facility]    Ankle plantarflexion [facility] WFL    Ankle inversion      Ankle eversion       (Blank rows = not tested)  LOWER EXTREMITY MMT:  MMT Right eval  Left eval  Hip flexion 2- 5  Hip extension 3+ 5  Hip abduction 3+ 5  Hip adduction    Hip internal rotation    Hip external rotation    Knee flexion  5  Knee extension  5  Ankle dorsiflexion 4 5  Ankle plantarflexion 4 5  Ankle inversion    Ankle eversion     (Blank rows = not tested)    FUNCTIONAL TESTS:  2 minute walk test: 216 ft done with knee brace and B axillary crutches  GAIT: Distance walked: 216 ft Assistive device utilized: Crutches Level of assistance: Complete Independence Comments: Modified 3-point pattern   TODAY'S TREATMENT:                                                                                                                              DATE:  10/28/22 Progress note  Bike seat 11 x 5' for right knee mobility FOTO 51 AROM  2 MWT no  AD; wearing brace 388 ft  Standing: Heel/toe raises x 20 Slant board 5 x 20" Mini squat x 10 TKE GTB x 10  Supine: AROM right knee 0-115    10/26/22 Supine: Noted suture exposed left medial incision MD notified Heel slides with slight overpressure right knee flexion starting at 91; ending at 107 Quad sets 5 sec hold x 10 SLR 2 x 10  Bike seat 11 x 3' for mobility  Standing: Heel raises x 20 4" step ups x 15  Gait training with 1 crutch   10/02/22 Supine  Heel slides with a strap x 10 x 3 with 30" hold on the last rep of flexion  SLR 3-ways (flex, abd, ext) x 10 x 2 Prone:  Hamstring curls with ankle resting on bolster x 10 x 2 Gait training on level surface, CGA-SBA, 1 crutch on L, 2-point pattern, 1 lap around the clinic Stair negotiation (4" and 7" step), 1 crutch on L and opposite UE on railing, CGA-SBA, 3 rounds on 7" steps, 1 round on 4" steps  10/01/22 Supine Heelslides 10X   SLR 10X AROM 2 sets  Bridge 10X Side lying hip abduction Rt 10X 2 sets  Hip adduction Rt 10X X 2 sets Prone hip extension 10X 2 sets  Knee flexion 10X 2 sets Standing:  gait training with 1 crutch and brace locked at 30 degrees.   09/29/22 Supine quad sets 10X5"  Heelslides 10X (ROM 77 degrees)  SLR 5X AAROM, 5X AROM Side lying hip abduction Rt 10X  Hip adduction Rt 10X Prone hip extension 10X  Knee flexion 10X AROM today supine 0-77 Rt knee  09/18/2022 Evaluation and patient education done R Heel slides with strap x 10 R quads sets x 6" x 10    PATIENT EDUCATION:  Education details: Educated on the goals and course of rehab. Written HEP provided and reviewed Person educated: Patient Education method: Explanation, Demonstration, and Handouts Education comprehension: verbalized understanding and  returned demonstration  HOME EXERCISE PROGRAM: Access Code: [email or web address] URL: [email or web address]  Date: 09/29/2022 Prepared by: Emeline Gins Exercises - Active  Straight Leg Raise with Quad Set  - 2 x daily - 7 x weekly - 3 sets - 10 reps - Sidelying Hip Abduction  - 2 x daily - 7 x weekly - 3 sets - 10 reps - Sidelying Hip Adduction  - 3 x daily - 7 x weekly - 3 sets - 10 reps - Prone Hip Extension  - 3 x daily - 7 x weekly - 3 sets - 10 reps - Prone Knee Flexion AROM  - 3 x daily - 7 x weekly - 3 sets - 10 reps  Date: 09/18/2022 Prepared by: Krystal Clark Exercises - Supine Heel Slide with Strap  - 2 x daily - 7 x weekly - 3 sets - 10 reps - Supine Quad Set  - 2 x daily - 7 x weekly - 3 sets - 10 reps - 6 hold  ASSESSMENT:  CLINICAL IMPRESSION: Progress note today; Patient now 9 weeks s/p; stitch no longer visible so likely fell out since last visit.  Patient able to walk in PT gym without crutches with brace on; good improvement with FOTO, AROM and 2 MWT.  Continues with poor to fair quad control. To date, skilled PT is required to address the impairments and improve function.   OBJECTIVE IMPAIRMENTS: Abnormal gait, decreased activity tolerance, decreased balance, difficulty walking, decreased ROM, decreased strength, impaired flexibility, and pain.   ACTIVITY LIMITATIONS: carrying, lifting, bending, sitting, squatting, sleeping, stairs, transfers, bed mobility, bathing, toileting, dressing, and hygiene/grooming  PARTICIPATION LIMITATIONS: meal prep, cleaning, laundry, driving, shopping, community activity, and occupation  PERSONAL FACTORS: Time since onset of injury/illness/exacerbation are also affecting patient's functional outcome.   REHAB POTENTIAL: Good  CLINICAL DECISION MAKING: Stable/uncomplicated  EVALUATION COMPLEXITY: Low   GOALS: Goals reviewed with patient? Yes  SHORT TERM GOALS: Target date: 10/09/2022 Pt will demonstrate indep in HEP to facilitate carry-over of skilled services and improve functional outcomes Goal status: IN PROGRESS  2.  Patient will demonstrate increase in knee flex ROM to 120 degrees to  facilitate ease in ambulation Baseline: 20 degrees Goal status: IN PROGRESS   LONG TERM GOALS: Target date: 10/30/2022  Pt will increase by at least 40 ft without assistive device in order to demonstrate clinically significant improvement in community ambulation Baseline: 216 ft Goal status: IN PROGRESS  2.  Pt will increase FOTO to the predicted pattern in order to demonstrate significant improvement in function related to ambulation  Baseline: 23.08 Goal status: IN PROGRESS  3.  Pt will demonstrate increase in LE strength to 5 to facilitate ease and safety in ambulation  Baseline: 2-/5 Goal status: IN PROGRESS  4.  Pt will be able to perform SLS on the R to 15 sec (firm surface, eyes open( to facilitate ease and safety and ambulation Baseline: not performed Goal status: IN PROGRESS  PLAN:  PT FREQUENCY: 3x/week  PT DURATION: 6 weeks  PLANNED INTERVENTIONS: Therapeutic exercises, Therapeutic activity, Neuromuscular re-education, Balance training, Gait training, Patient/Family education, Self Care, Stair training, and Manual therapy  PLAN FOR NEXT SESSION: Continue POC and may progress per protocol with emphasis on ROM and strength.  hip abduction, lateral step ups  1:48 PM, 10/28/22 Markeshia Giebel Small Cyle Kenyon MPT [facility] [place] [phone] Ph:[phone]

## 2022-10-29 ENCOUNTER — Encounter: Payer: Self-pay | Admitting: Orthopedic Surgery

## 2022-11-02 ENCOUNTER — Ambulatory Visit (HOSPITAL_COMMUNITY): Payer: Self-pay

## 2022-11-02 DIAGNOSIS — R262 Difficulty in walking, not elsewhere classified: Secondary | ICD-10-CM

## 2022-11-02 DIAGNOSIS — M25561 Pain in right knee: Secondary | ICD-10-CM

## 2022-11-02 DIAGNOSIS — R29898 Other symptoms and signs involving the musculoskeletal system: Secondary | ICD-10-CM

## 2022-11-02 NOTE — Therapy (Signed)
OUTPATIENT PHYSICAL THERAPY TREATMENT     Patient Name: Kristine Adams MRN: [id number] DOB:1985/08/13, 37 y.o., female Today's Date: 11/02/2022  END OF SESSION:  PT End of Session - 11/02/22 1309     Visit Number 6    Number of Visits 16    Date for PT Re-Evaluation 11/27/22    Authorization Type 50% [facility] [employer]    PT Start Time 1305    PT Stop Time 1345    PT Time Calculation (min) 40 min    Activity Tolerance Patient tolerated treatment well    Behavior During Therapy WFL for tasks assessed/performed            Past Medical History:  Diagnosis Date   Anemia    Hyperlipidemia    No pertinent past medical history    Past Surgical History:  Procedure Laterality Date   ANTERIOR CRUCIATE LIGAMENT REPAIR Right 09/08/2022   Procedure: ANTERIOR CRUCIATE LIGAMENT (ACL) REPAIR WITH QUADRICEPS AUTOGRAFT;  Surgeon: Vickki Hearing, MD;  Location: [facility];  Service: Orthopedics;  Laterality: Right;   BREAST LUMPECTOMY WITH RADIOACTIVE SEED LOCALIZATION Right 07/23/2020   Procedure: RIGHT BREAST LUMPECTOMYX 2  WITH RADIOACTIVE SEED LOCALIZATION;  Surgeon: Harriette Bouillon, MD;  Location: [facility];  Service: General;  Laterality: Right;   CESAREAN SECTION     CESAREAN SECTION WITH BILATERAL TUBAL LIGATION  07/11/2012   Procedure: CESAREAN SECTION WITH BILATERAL TUBAL LIGATION;  Surgeon: Tilda Burrow, MD;  Location: [facility];  Service: Obstetrics;  Laterality: Bilateral;  speaks spanish, interpreter needed   KNEE ARTHROSCOPY WITH MEDIAL MENISECTOMY Right 09/08/2022   Procedure: KNEE ARTHROSCOPY EXAM UNDER ANESTHESIA;  Surgeon: Vickki Hearing, MD;  Location: [facility];  Service: Orthopedics;  Laterality: Right;   TUBAL LIGATION     Patient Active Problem List   Diagnosis Date Noted   Rupture of anterior cruciate ligament of right knee 09/08/2022   Right shoulder pain 05/12/2022   History of DVT of lower extremity 05/12/2022   Encounter for  gynecological examination with Papanicolaou smear of cervix 05/12/2022   Right knee pain 05/12/2022   Dyspareunia, female 05/12/2022   DVT (deep venous thrombosis) (HCC) 03/16/2022   Itching of vulva 07/28/2021   Screening breast examination 01/24/2019   Left breast mass 01/24/2019   Menorrhagia with regular cycle 09/21/2018    PCP: Reather Converse, PA-C  REFERRING PROVIDER: Vickki Hearing, MD  REFERRING DIAG: [phone] (ICD-10-CM) - S/P ACL reconstruction  THERAPY DIAG:  Right knee pain, unspecified chronicity  Difficulty walking  Weakness of right lower extremity  Rationale for Evaluation and Treatment: Rehabilitation  ONSET DATE: 09/08/2022  SUBJECTIVE:   SUBJECTIVE STATEMENT: Pt comes with interpreter Kristine Adams) today. No pain today; soreness in right calf but otherwise no pain  Evaluation: Arrives to the clinic  with a hinged knee brace and B axillary crutches S/P R ACL. Patient had to have surgery following an incident of fall in July 2023. However, patient had a blood clot so they treated the blood clot first before she had the ACL surgery. Patient is currently taking ASA for the blood clot. Per patient, patient is WBAT with the brace on. Patient was advised by surgeon to put ice 3x/day for 30 minutes and is helping. Patient is now referred to outpatient PT evaluation and management.  PERTINENT HISTORY: Dizziness, blood clots PAIN:  Are you having pain? Yes: NPRS scale: 0/10 Pain location: in front of the R knee Pain description: pulsating Aggravating factors:  moving the knee, walking Relieving factors: rest  PRECAUTIONS: Other: Can take the brace when laying down. Needs to wear the brace locked in ext for 4 weeks when upright  WEIGHT BEARING RESTRICTIONS: Yes WBAT  FALLS:  Has patient fallen in last 6 months? No  LIVING ENVIRONMENT: Lives with: lives with their family Lives in: Mobile home Stairs: Yes: External: 7 steps; can reach both Has following  equipment at home: Crutches  OCCUPATION: in food preparation  PLOF: Independent  PATIENT GOALS: "to walk perfectly so I can get back to work"  NEXT MD VISIT: 11/26/22  OBJECTIVE:   DIAGNOSTIC FINDINGS:  MRI OF THE RIGHT KNEE WITHOUT CONTRAST 03/04/2022   TECHNIQUE: Multiplanar, multisequence MR imaging of the right was performed. No intravenous contrast was administered.   COMPARISON:  Radiographs dated January 06, 2022 views of the ladder physical apical alignment disc shaft   FINDINGS: MENISCI   Medial: Intact.   Lateral: Radial tear of the posterior horn of the lateral meniscus.   LIGAMENTS   Cruciates: PCL is intact. Thickening and edema of the ACL without evidence of tear suggesting ligamentous sprain.   Collaterals: Thickening and edema of the medial collateral ligament suggesting partial-thickness tear, likely chronic process. Lateral collateral ligament complex is intact.   CARTILAGE   Patellofemoral:  No chondral defect.   Medial: Mild partial-thickness cartilage loss of the medial femorotibial compartment.   Lateral:  No chondral defect.   JOINT: Moderate joint effusion. Normal Hoffa's fat-pad. No plical thickening.   POPLITEAL FOSSA: Popliteus tendon is intact. No Baker's cyst.   EXTENSOR MECHANISM: Intact quadriceps tendon. Mild patellar tendinosis without tear. Intact lateral patellar retinaculum. Intact medial patellar retinaculum. Intact MPFL.   BONES: No aggressive osseous lesion. No fracture or dislocation.   Other: No fluid collection or hematoma. Muscles are normal.   IMPRESSION: 1.  Radial tear of the posterior horn of the lateral meniscus.   2. Thickening/partial-thickness tear of the medial collateral ligament,, likely a chronic process.   2. Edema and thickening of the anterior cruciate ligament without evidence of discrete tear suggesting chronic ligamentous sprain.   3.  Moderate knee joint effusion.   4.  No evidence of  fracture or osteonecrosis.  PATIENT SURVEYS:  FOTO 23.08  COGNITION: Overall cognitive status: Within functional limits for tasks assessed     SENSATION: Not tested  EDEMA:  Slight swelling on R knee. Bandage present without drainage. Knee brace locked in extension  MUSCLE LENGTH: Moderate restriction on B hamstrings Mild restriction on B hamstrings  POSTURE: rounded shoulders and forward head  PALPATION: Grade 1 tenderness on major bony landmarks on the R knee  LOWER EXTREMITY ROM:  Active ROM Right eval Left eval Right 09/29/22 Right 10/28/22  Hip flexion [facility]    Hip extension [facility]    Hip abduction [facility]    Hip adduction      Hip internal rotation      Hip external rotation      Knee flexion 20 WFL 77 115  Knee extension 0 WFL 0 0 (left knee hyperextends)  Ankle dorsiflexion [facility]    Ankle plantarflexion [facility] WFL    Ankle inversion      Ankle eversion       (Blank rows = not tested)  LOWER EXTREMITY MMT:  MMT Right eval Left eval  Hip flexion 2- 5  Hip extension 3+ 5  Hip abduction 3+ 5  Hip adduction    Hip internal rotation  Hip external rotation    Knee flexion  5  Knee extension  5  Ankle dorsiflexion 4 5  Ankle plantarflexion 4 5  Ankle inversion    Ankle eversion     (Blank rows = not tested)    FUNCTIONAL TESTS:  2 minute walk test: 216 ft done with knee brace and B axillary crutches  GAIT: Distance walked: 216 ft Assistive device utilized: Crutches Level of assistance: Complete Independence Comments: Modified 3-point pattern   TODAY'S TREATMENT:                                                                                                                              DATE:  11/02/22 Bike seat 11 x 5' for right knee mobility  Slant board 5 x 20" Squats x 10 Knee drives for flexion x 2' on 12" step x 2' Hamstring stretch on 12" step x 2' Squats 2 x 10  4" step ups x 10 with 1 HHA 8" step ups x 5 with 1 HHA 4" step  navigation ascend and descend with 1 HHA    10/28/22 Progress note  Bike seat 11 x 5' for right knee mobility FOTO 51 AROM  2 MWT no AD; wearing brace 388 ft  Standing: Heel/toe raises x 20 Slant board 5 x 20" Mini squat x 10 TKE GTB x 10  Supine: AROM right knee 0-115    10/26/22 Supine: Noted suture exposed left medial incision MD notified Heel slides with slight overpressure right knee flexion starting at 91; ending at 107 Quad sets 5 sec hold x 10 SLR 2 x 10  Bike seat 11 x 3' for mobility  Standing: Heel raises x 20 4" step ups x 15  Gait training with 1 crutch   10/02/22 Supine  Heel slides with a strap x 10 x 3 with 30" hold on the last rep of flexion  SLR 3-ways (flex, abd, ext) x 10 x 2 Prone:  Hamstring curls with ankle resting on bolster x 10 x 2 Gait training on level surface, CGA-SBA, 1 crutch on L, 2-point pattern, 1 lap around the clinic Stair negotiation (4" and 7" step), 1 crutch on L and opposite UE on railing, CGA-SBA, 3 rounds on 7" steps, 1 round on 4" steps  10/01/22 Supine Heelslides 10X   SLR 10X AROM 2 sets  Bridge 10X Side lying hip abduction Rt 10X 2 sets  Hip adduction Rt 10X X 2 sets Prone hip extension 10X 2 sets  Knee flexion 10X 2 sets Standing:  gait training with 1 crutch and brace locked at 30 degrees.   09/29/22 Supine quad sets 10X5"  Heelslides 10X (ROM 77 degrees)  SLR 5X AAROM, 5X AROM Side lying hip abduction Rt 10X  Hip adduction Rt 10X Prone hip extension 10X  Knee flexion 10X AROM today supine 0-77 Rt knee  09/18/2022 Evaluation and patient education done R Heel slides with strap x 10 R  quads sets x 6" x 10    PATIENT EDUCATION:  Education details: Educated on the goals and course of rehab. Written HEP provided and reviewed Person educated: Patient Education method: Explanation, Demonstration, and Handouts Education comprehension: verbalized understanding and returned demonstration  HOME EXERCISE  PROGRAM: Access Code: [email or web address] URL: [email or web address]  Date: 09/29/2022 Prepared by: Emeline Gins Exercises - Active Straight Leg Raise with Quad Set  - 2 x daily - 7 x weekly - 3 sets - 10 reps - Sidelying Hip Abduction  - 2 x daily - 7 x weekly - 3 sets - 10 reps - Sidelying Hip Adduction  - 3 x daily - 7 x weekly - 3 sets - 10 reps - Prone Hip Extension  - 3 x daily - 7 x weekly - 3 sets - 10 reps - Prone Knee Flexion AROM  - 3 x daily - 7 x weekly - 3 sets - 10 reps  Date: 09/18/2022 Prepared by: Krystal Clark Exercises - Supine Heel Slide with Strap  - 2 x daily - 7 x weekly - 3 sets - 10 reps - Supine Quad Set  - 2 x daily - 7 x weekly - 3 sets - 10 reps - 6 hold  ASSESSMENT:  CLINICAL IMPRESSION: Progressed step ups today and added additional stretching for hamstring and for knee flexion due to continued stiffness in the knee.  Updated HEP.  Some discomfort with 8" step ups; encouraged compliance with HEP.  To date, skilled PT is required to address the impairments and improve function.   OBJECTIVE IMPAIRMENTS: Abnormal gait, decreased activity tolerance, decreased balance, difficulty walking, decreased ROM, decreased strength, impaired flexibility, and pain.   ACTIVITY LIMITATIONS: carrying, lifting, bending, sitting, squatting, sleeping, stairs, transfers, bed mobility, bathing, toileting, dressing, and hygiene/grooming  PARTICIPATION LIMITATIONS: meal prep, cleaning, Adams, driving, shopping, community activity, and occupation  PERSONAL FACTORS: Time since onset of injury/illness/exacerbation are also affecting patient's functional outcome.   REHAB POTENTIAL: Good  CLINICAL DECISION MAKING: Stable/uncomplicated  EVALUATION COMPLEXITY: Low   GOALS: Goals reviewed with patient? Yes  SHORT TERM GOALS: Target date: 10/09/2022 Pt will demonstrate indep in HEP to facilitate carry-over of skilled services and improve functional outcomes Goal status:  IN PROGRESS  2.  Patient will demonstrate increase in knee flex ROM to 120 degrees to facilitate ease in ambulation Baseline: 20 degrees Goal status: IN PROGRESS   LONG TERM GOALS: Target date: 10/30/2022  Pt will increase by at least 40 ft without assistive device in order to demonstrate clinically significant improvement in community ambulation Baseline: 216 ft Goal status: IN PROGRESS  2.  Pt will increase FOTO to the predicted pattern in order to demonstrate significant improvement in function related to ambulation  Baseline: 23.08 Goal status: IN PROGRESS  3.  Pt will demonstrate increase in LE strength to 5 to facilitate ease and safety in ambulation  Baseline: 2-/5 Goal status: IN PROGRESS  4.  Pt will be able to perform SLS on the R to 15 sec (firm surface, eyes open( to facilitate ease and safety and ambulation Baseline: not performed Goal status: IN PROGRESS  PLAN:  PT FREQUENCY: 3x/week  PT DURATION: 6 weeks  PLANNED INTERVENTIONS: Therapeutic exercises, Therapeutic activity, Neuromuscular re-education, Balance training, Gait training, Patient/Family education, Self Care, Stair training, and Manual therapy  PLAN FOR NEXT SESSION: Continue POC and may progress per protocol with emphasis on ROM and strength.  hip abduction, lateral step ups  1:52 PM, 11/02/22 Sherriann Szuch Small Dea Bitting  MPT [facility] [place] [phone]

## 2022-11-02 NOTE — Addendum Note (Signed)
Addended byWilhemena Durie S on: 11/02/2022 07:13 AM   Modules accepted: Orders

## 2022-11-04 ENCOUNTER — Ambulatory Visit (HOSPITAL_COMMUNITY): Payer: Self-pay

## 2022-11-04 DIAGNOSIS — M25561 Pain in right knee: Secondary | ICD-10-CM

## 2022-11-04 DIAGNOSIS — R29898 Other symptoms and signs involving the musculoskeletal system: Secondary | ICD-10-CM

## 2022-11-04 DIAGNOSIS — R262 Difficulty in walking, not elsewhere classified: Secondary | ICD-10-CM

## 2022-11-04 NOTE — Therapy (Signed)
OUTPATIENT PHYSICAL THERAPY TREATMENT     Patient Name: Kristine Adams MRN: [id number] DOB:1985-09-17, 37 y.o., female Today's Date: 11/04/2022  END OF SESSION:  PT End of Session - 11/04/22 1305     Visit Number 7    Number of Visits 16    Date for PT Re-Evaluation 11/27/22    Authorization Type 50% [facility] [employer]    PT Start Time 1305    PT Stop Time 1345    PT Time Calculation (min) 40 min    Activity Tolerance Patient tolerated treatment well    Behavior During Therapy WFL for tasks assessed/performed            Past Medical History:  Diagnosis Date   Anemia    Hyperlipidemia    No pertinent past medical history    Past Surgical History:  Procedure Laterality Date   ANTERIOR CRUCIATE LIGAMENT REPAIR Right 09/08/2022   Procedure: ANTERIOR CRUCIATE LIGAMENT (ACL) REPAIR WITH QUADRICEPS AUTOGRAFT;  Surgeon: Vickki Hearing, MD;  Location: [facility];  Service: Orthopedics;  Laterality: Right;   BREAST LUMPECTOMY WITH RADIOACTIVE SEED LOCALIZATION Right 07/23/2020   Procedure: RIGHT BREAST LUMPECTOMYX 2  WITH RADIOACTIVE SEED LOCALIZATION;  Surgeon: Harriette Bouillon, MD;  Location: [facility];  Service: General;  Laterality: Right;   CESAREAN SECTION     CESAREAN SECTION WITH BILATERAL TUBAL LIGATION  07/11/2012   Procedure: CESAREAN SECTION WITH BILATERAL TUBAL LIGATION;  Surgeon: Tilda Burrow, MD;  Location: [facility];  Service: Obstetrics;  Laterality: Bilateral;  speaks spanish, interpreter needed   KNEE ARTHROSCOPY WITH MEDIAL MENISECTOMY Right 09/08/2022   Procedure: KNEE ARTHROSCOPY EXAM UNDER ANESTHESIA;  Surgeon: Vickki Hearing, MD;  Location: [facility];  Service: Orthopedics;  Laterality: Right;   TUBAL LIGATION     Patient Active Problem List   Diagnosis Date Noted   Rupture of anterior cruciate ligament of right knee 09/08/2022   Right shoulder pain 05/12/2022   History of DVT of lower extremity 05/12/2022   Encounter for  gynecological examination with Papanicolaou smear of cervix 05/12/2022   Right knee pain 05/12/2022   Dyspareunia, female 05/12/2022   DVT (deep venous thrombosis) (HCC) 03/16/2022   Itching of vulva 07/28/2021   Screening breast examination 01/24/2019   Left breast mass 01/24/2019   Menorrhagia with regular cycle 09/21/2018    PCP: Reather Converse, PA-C  REFERRING PROVIDER: Vickki Hearing, MD  REFERRING DIAG: [phone] (ICD-10-CM) - S/P ACL reconstruction  THERAPY DIAG:  Right knee pain, unspecified chronicity  Difficulty walking  Weakness of right lower extremity  Rationale for Evaluation and Treatment: Rehabilitation  ONSET DATE: 09/08/2022  SUBJECTIVE:   SUBJECTIVE STATEMENT: Pt comes with interpreter Lamar Laundry) today. No pain today; soreness in right calf but otherwise no pain  Evaluation: Arrives to the clinic  with a hinged knee brace and B axillary crutches S/P R ACL. Patient had to have surgery following an incident of fall in July 2023. However, patient had a blood clot so they treated the blood clot first before she had the ACL surgery. Patient is currently taking ASA for the blood clot. Per patient, patient is WBAT with the brace on. Patient was advised by surgeon to put ice 3x/day for 30 minutes and is helping. Patient is now referred to outpatient PT evaluation and management.  PERTINENT HISTORY: Dizziness, blood clots PAIN:  Are you having pain? Yes: NPRS scale: 0/10 Pain location: in front of the R knee Pain description: pulsating Aggravating factors:  moving the knee, walking Relieving factors: rest  PRECAUTIONS: Other: Can take the brace when laying down. Needs to wear the brace locked in ext for 4 weeks when upright  WEIGHT BEARING RESTRICTIONS: Yes WBAT  FALLS:  Has patient fallen in last 6 months? No  LIVING ENVIRONMENT: Lives with: lives with their family Lives in: Mobile home Stairs: Yes: External: 7 steps; can reach both Has following  equipment at home: Crutches  OCCUPATION: in food preparation  PLOF: Independent  PATIENT GOALS: "to walk perfectly so I can get back to work"  NEXT MD VISIT: 11/26/22  OBJECTIVE:   DIAGNOSTIC FINDINGS:  MRI OF THE RIGHT KNEE WITHOUT CONTRAST 03/04/2022   TECHNIQUE: Multiplanar, multisequence MR imaging of the right was performed. No intravenous contrast was administered.   COMPARISON:  Radiographs dated January 06, 2022 views of the ladder physical apical alignment disc shaft   FINDINGS: MENISCI   Medial: Intact.   Lateral: Radial tear of the posterior horn of the lateral meniscus.   LIGAMENTS   Cruciates: PCL is intact. Thickening and edema of the ACL without evidence of tear suggesting ligamentous sprain.   Collaterals: Thickening and edema of the medial collateral ligament suggesting partial-thickness tear, likely chronic process. Lateral collateral ligament complex is intact.   CARTILAGE   Patellofemoral:  No chondral defect.   Medial: Mild partial-thickness cartilage loss of the medial femorotibial compartment.   Lateral:  No chondral defect.   JOINT: Moderate joint effusion. Normal Hoffa's fat-pad. No plical thickening.   POPLITEAL FOSSA: Popliteus tendon is intact. No Baker's cyst.   EXTENSOR MECHANISM: Intact quadriceps tendon. Mild patellar tendinosis without tear. Intact lateral patellar retinaculum. Intact medial patellar retinaculum. Intact MPFL.   BONES: No aggressive osseous lesion. No fracture or dislocation.   Other: No fluid collection or hematoma. Muscles are normal.   IMPRESSION: 1.  Radial tear of the posterior horn of the lateral meniscus.   2. Thickening/partial-thickness tear of the medial collateral ligament,, likely a chronic process.   2. Edema and thickening of the anterior cruciate ligament without evidence of discrete tear suggesting chronic ligamentous sprain.   3.  Moderate knee joint effusion.   4.  No evidence of  fracture or osteonecrosis.  PATIENT SURVEYS:  FOTO 23.08  COGNITION: Overall cognitive status: Within functional limits for tasks assessed     SENSATION: Not tested  EDEMA:  Slight swelling on R knee. Bandage present without drainage. Knee brace locked in extension  MUSCLE LENGTH: Moderate restriction on B hamstrings Mild restriction on B hamstrings  POSTURE: rounded shoulders and forward head  PALPATION: Grade 1 tenderness on major bony landmarks on the R knee  LOWER EXTREMITY ROM:  Active ROM Right eval Left eval Right 09/29/22 Right 10/28/22  Hip flexion [facility]    Hip extension [facility]    Hip abduction [facility]    Hip adduction      Hip internal rotation      Hip external rotation      Knee flexion 20 WFL 77 115  Knee extension 0 WFL 0 0 (left knee hyperextends)  Ankle dorsiflexion [facility]    Ankle plantarflexion [facility] WFL    Ankle inversion      Ankle eversion       (Blank rows = not tested)  LOWER EXTREMITY MMT:  MMT Right eval Left eval  Hip flexion 2- 5  Hip extension 3+ 5  Hip abduction 3+ 5  Hip adduction    Hip internal rotation  Hip external rotation    Knee flexion  5  Knee extension  5  Ankle dorsiflexion 4 5  Ankle plantarflexion 4 5  Ankle inversion    Ankle eversion     (Blank rows = not tested)    FUNCTIONAL TESTS:  2 minute walk test: 216 ft done with knee brace and B axillary crutches  GAIT: Distance walked: 216 ft Assistive device utilized: Crutches Level of assistance: Complete Independence Comments: Modified 3-point pattern   TODAY'S TREATMENT:                                                                                                                              DATE:  11/04/22 Bike seat 11 x 5' for right knee mobility  Standing: Wall slides 10" hold x 5 at 45 degree angle Heel/toe raises x 20 Slant board 5 x 20" 4" Lateral step ups 2 x 10 Lunge onto 4" step x 10 4" anterior step ups 2 x 10  Stool scoots x  100 ft x 2  TM 1.5 gait training x 4'    11/02/22 Bike seat 11 x 5' for right knee mobility  Slant board 5 x 20" Squats x 10 Knee drives for flexion x 2' on 12" step x 2' Hamstring stretch on 12" step x 2' Squats 2 x 10  4" step ups x 10 with 1 HHA 8" step ups x 5 with 1 HHA 4" step navigation ascend and descend with 1 HHA    10/28/22 Progress note  Bike seat 11 x 5' for right knee mobility FOTO 51 AROM  2 MWT no AD; wearing brace 388 ft  Standing: Heel/toe raises x 20 Slant board 5 x 20" Mini squat x 10 TKE GTB x 10  Supine: AROM right knee 0-115    10/26/22 Supine: Noted suture exposed left medial incision MD notified Heel slides with slight overpressure right knee flexion starting at 91; ending at 107 Quad sets 5 sec hold x 10 SLR 2 x 10  Bike seat 11 x 3' for mobility  Standing: Heel raises x 20 4" step ups x 15  Gait training with 1 crutch   10/02/22 Supine  Heel slides with a strap x 10 x 3 with 30" hold on the last rep of flexion  SLR 3-ways (flex, abd, ext) x 10 x 2 Prone:  Hamstring curls with ankle resting on bolster x 10 x 2 Gait training on level surface, CGA-SBA, 1 crutch on L, 2-point pattern, 1 lap around the clinic Stair negotiation (4" and 7" step), 1 crutch on L and opposite UE on railing, CGA-SBA, 3 rounds on 7" steps, 1 round on 4" steps  10/01/22 Supine Heelslides 10X   SLR 10X AROM 2 sets  Bridge 10X Side lying hip abduction Rt 10X 2 sets  Hip adduction Rt 10X X 2 sets Prone hip extension 10X 2 sets  Knee flexion 10X 2 sets Standing:  gait  training with 1 crutch and brace locked at 30 degrees.   09/29/22 Supine quad sets 10X5"  Heelslides 10X (ROM 77 degrees)  SLR 5X AAROM, 5X AROM Side lying hip abduction Rt 10X  Hip adduction Rt 10X Prone hip extension 10X  Knee flexion 10X AROM today supine 0-77 Rt knee  09/18/2022 Evaluation and patient education done R Heel slides with strap x 10 R quads sets x 6" x 10     PATIENT EDUCATION:  Education details: Educated on the goals and course of rehab. Written HEP provided and reviewed Person educated: Patient Education method: Explanation, Demonstration, and Handouts Education comprehension: verbalized understanding and returned demonstration  HOME EXERCISE PROGRAM: Access Code: [email or web address] URL: [email or web address]  Date: 09/29/2022 Prepared by: Emeline Gins Exercises - Active Straight Leg Raise with Quad Set  - 2 x daily - 7 x weekly - 3 sets - 10 reps - Sidelying Hip Abduction  - 2 x daily - 7 x weekly - 3 sets - 10 reps - Sidelying Hip Adduction  - 3 x daily - 7 x weekly - 3 sets - 10 reps - Prone Hip Extension  - 3 x daily - 7 x weekly - 3 sets - 10 reps - Prone Knee Flexion AROM  - 3 x daily - 7 x weekly - 3 sets - 10 reps  Date: 09/18/2022 Prepared by: Krystal Clark Exercises - Supine Heel Slide with Strap  - 2 x daily - 7 x weekly - 3 sets - 10 reps - Supine Quad Set  - 2 x daily - 7 x weekly - 3 sets - 10 reps - 6 hold  ASSESSMENT:  CLINICAL IMPRESSION: Today's session continued to address right knee mobility and strength; she has some lingering swelling right knee; added stool scoots, wall squats with hold 10" to 45 degrees; quad fatigue noted with wall sits/slides.  Continues with difficulty with step ups has to occasionally use one hand to assist with balance.  Added treadmill today to work on gait training/ improving heels strike right leg.   To date, skilled PT is required to address the impairments and improve function.   OBJECTIVE IMPAIRMENTS: Abnormal gait, decreased activity tolerance, decreased balance, difficulty walking, decreased ROM, decreased strength, impaired flexibility, and pain.   ACTIVITY LIMITATIONS: carrying, lifting, bending, sitting, squatting, sleeping, stairs, transfers, bed mobility, bathing, toileting, dressing, and hygiene/grooming  PARTICIPATION LIMITATIONS: meal prep, cleaning, laundry,  driving, shopping, community activity, and occupation  PERSONAL FACTORS: Time since onset of injury/illness/exacerbation are also affecting patient's functional outcome.   REHAB POTENTIAL: Good  CLINICAL DECISION MAKING: Stable/uncomplicated  EVALUATION COMPLEXITY: Low   GOALS: Goals reviewed with patient? Yes  SHORT TERM GOALS: Target date: 10/09/2022 Pt will demonstrate indep in HEP to facilitate carry-over of skilled services and improve functional outcomes Goal status: IN PROGRESS  2.  Patient will demonstrate increase in knee flex ROM to 120 degrees to facilitate ease in ambulation Baseline: 20 degrees Goal status: IN PROGRESS   LONG TERM GOALS: Target date: 10/30/2022  Pt will increase by at least 40 ft without assistive device in order to demonstrate clinically significant improvement in community ambulation Baseline: 216 ft Goal status: IN PROGRESS  2.  Pt will increase FOTO to the predicted pattern in order to demonstrate significant improvement in function related to ambulation  Baseline: 23.08 Goal status: IN PROGRESS  3.  Pt will demonstrate increase in LE strength to 5 to facilitate ease and safety in ambulation  Baseline: 2-/5 Goal status:  IN PROGRESS  4.  Pt will be able to perform SLS on the R to 15 sec (firm surface, eyes open( to facilitate ease and safety and ambulation Baseline: not performed Goal status: IN PROGRESS  PLAN:  PT FREQUENCY: 3x/week  PT DURATION: 6 weeks  PLANNED INTERVENTIONS: Therapeutic exercises, Therapeutic activity, Neuromuscular re-education, Balance training, Gait training, Patient/Family education, Self Care, Stair training, and Manual therapy  PLAN FOR NEXT SESSION: Continue POC and may progress per protocol with emphasis on ROM and strength.   lateral step ups  2:02 PM, 11/04/22 Hermann Dottavio Small Koy Lamp MPT [place] [facility] [place] [phone]

## 2022-11-10 ENCOUNTER — Ambulatory Visit (HOSPITAL_COMMUNITY): Payer: Self-pay

## 2022-11-10 DIAGNOSIS — R29898 Other symptoms and signs involving the musculoskeletal system: Secondary | ICD-10-CM

## 2022-11-10 DIAGNOSIS — R262 Difficulty in walking, not elsewhere classified: Secondary | ICD-10-CM

## 2022-11-10 DIAGNOSIS — M25561 Pain in right knee: Secondary | ICD-10-CM

## 2022-11-10 NOTE — Therapy (Addendum)
OUTPATIENT PHYSICAL THERAPY TREATMENT     Patient Name: Kristine Adams MRN: [id number] DOB:January 16, 1986, 37 y.o., female Today's Date: 11/10/2022  END OF SESSION:  PT End of Session - 11/10/22 1305     Visit Number 8    Number of Visits 16    Date for PT Re-Evaluation 11/27/22    Authorization Type 50% [facility] [employer]    PT Start Time 1305    PT Stop Time 1345    PT Time Calculation (min) 40 min    Activity Tolerance Patient tolerated treatment well    Behavior During Therapy WFL for tasks assessed/performed            Past Medical History:  Diagnosis Date   Anemia    Hyperlipidemia    No pertinent past medical history    Past Surgical History:  Procedure Laterality Date   ANTERIOR CRUCIATE LIGAMENT REPAIR Right 09/08/2022   Procedure: ANTERIOR CRUCIATE LIGAMENT (ACL) REPAIR WITH QUADRICEPS AUTOGRAFT;  Surgeon: Vickki Hearing, MD;  Location: [facility];  Service: Orthopedics;  Laterality: Right;   BREAST LUMPECTOMY WITH RADIOACTIVE SEED LOCALIZATION Right 07/23/2020   Procedure: RIGHT BREAST LUMPECTOMYX 2  WITH RADIOACTIVE SEED LOCALIZATION;  Surgeon: Harriette Bouillon, MD;  Location: [facility];  Service: General;  Laterality: Right;   CESAREAN SECTION     CESAREAN SECTION WITH BILATERAL TUBAL LIGATION  07/11/2012   Procedure: CESAREAN SECTION WITH BILATERAL TUBAL LIGATION;  Surgeon: Tilda Burrow, MD;  Location: [facility];  Service: Obstetrics;  Laterality: Bilateral;  speaks spanish, interpreter needed   KNEE ARTHROSCOPY WITH MEDIAL MENISECTOMY Right 09/08/2022   Procedure: KNEE ARTHROSCOPY EXAM UNDER ANESTHESIA;  Surgeon: Vickki Hearing, MD;  Location: [facility];  Service: Orthopedics;  Laterality: Right;   TUBAL LIGATION     Patient Active Problem List   Diagnosis Date Noted   Rupture of anterior cruciate ligament of right knee 09/08/2022   Right shoulder pain 05/12/2022   History of DVT of lower extremity 05/12/2022   Encounter for  gynecological examination with Papanicolaou smear of cervix 05/12/2022   Right knee pain 05/12/2022   Dyspareunia, female 05/12/2022   DVT (deep venous thrombosis) (HCC) 03/16/2022   Itching of vulva 07/28/2021   Screening breast examination 01/24/2019   Left breast mass 01/24/2019   Menorrhagia with regular cycle 09/21/2018    PCP: Reather Converse, PA-C  REFERRING PROVIDER: Vickki Hearing, MD  REFERRING DIAG: [phone] (ICD-10-CM) - S/P ACL reconstruction  THERAPY DIAG:  Right knee pain, unspecified chronicity  Difficulty walking  Weakness of right lower extremity  Rationale for Evaluation and Treatment: Rehabilitation  ONSET DATE: 09/08/2022  SUBJECTIVE:   SUBJECTIVE STATEMENT: Pt comes with interpreter Lamar Laundry) today. No pain today; put icy hot type cream on her knee prior to therapy today  Evaluation: Arrives to the clinic  with a hinged knee brace and B axillary crutches S/P R ACL. Patient had to have surgery following an incident of fall in July 2023. However, patient had a blood clot so they treated the blood clot first before she had the ACL surgery. Patient is currently taking ASA for the blood clot. Per patient, patient is WBAT with the brace on. Patient was advised by surgeon to put ice 3x/day for 30 minutes and is helping. Patient is now referred to outpatient PT evaluation and management.  PERTINENT HISTORY: Dizziness, blood clots PAIN:  Are you having pain? Yes: NPRS scale: 0/10 Pain location: in front of the R knee Pain  description: pulsating Aggravating factors: moving the knee, walking Relieving factors: rest  PRECAUTIONS: Other: Can take the brace when laying down. Needs to wear the brace locked in ext for 4 weeks when upright  WEIGHT BEARING RESTRICTIONS: Yes WBAT  FALLS:  Has patient fallen in last 6 months? No  LIVING ENVIRONMENT: Lives with: lives with their family Lives in: Mobile home Stairs: Yes: External: 7 steps; can reach both Has  following equipment at home: Crutches  OCCUPATION: in food preparation  PLOF: Independent  PATIENT GOALS: "to walk perfectly so I can get back to work"  NEXT MD VISIT: 11/26/22  OBJECTIVE:   DIAGNOSTIC FINDINGS:  MRI OF THE RIGHT KNEE WITHOUT CONTRAST 03/04/2022   TECHNIQUE: Multiplanar, multisequence MR imaging of the right was performed. No intravenous contrast was administered.   COMPARISON:  Radiographs dated January 06, 2022 views of the ladder physical apical alignment disc shaft   FINDINGS: MENISCI   Medial: Intact.   Lateral: Radial tear of the posterior horn of the lateral meniscus.   LIGAMENTS   Cruciates: PCL is intact. Thickening and edema of the ACL without evidence of tear suggesting ligamentous sprain.   Collaterals: Thickening and edema of the medial collateral ligament suggesting partial-thickness tear, likely chronic process. Lateral collateral ligament complex is intact.   CARTILAGE   Patellofemoral:  No chondral defect.   Medial: Mild partial-thickness cartilage loss of the medial femorotibial compartment.   Lateral:  No chondral defect.   JOINT: Moderate joint effusion. Normal Hoffa's fat-pad. No plical thickening.   POPLITEAL FOSSA: Popliteus tendon is intact. No Baker's cyst.   EXTENSOR MECHANISM: Intact quadriceps tendon. Mild patellar tendinosis without tear. Intact lateral patellar retinaculum. Intact medial patellar retinaculum. Intact MPFL.   BONES: No aggressive osseous lesion. No fracture or dislocation.   Other: No fluid collection or hematoma. Muscles are normal.   IMPRESSION: 1.  Radial tear of the posterior horn of the lateral meniscus.   2. Thickening/partial-thickness tear of the medial collateral ligament,, likely a chronic process.   2. Edema and thickening of the anterior cruciate ligament without evidence of discrete tear suggesting chronic ligamentous sprain.   3.  Moderate knee joint effusion.   4.  No evidence  of fracture or osteonecrosis.  PATIENT SURVEYS:  FOTO 23.08  COGNITION: Overall cognitive status: Within functional limits for tasks assessed     SENSATION: Not tested  EDEMA:  Slight swelling on R knee. Bandage present without drainage. Knee brace locked in extension  MUSCLE LENGTH: Moderate restriction on B hamstrings Mild restriction on B hamstrings  POSTURE: rounded shoulders and forward head  PALPATION: Grade 1 tenderness on major bony landmarks on the R knee  LOWER EXTREMITY ROM:  Active ROM Right eval Left eval Right 09/29/22 Right 10/28/22 Right 11/10/22  Hip flexion [facility]     Hip extension [facility]     Hip abduction [facility]     Hip adduction       Hip internal rotation       Hip external rotation       Knee flexion 20 WFL 77 115 120  Knee extension 0 WFL 0 0 (left knee hyperextends) 0  Ankle dorsiflexion [facility]     Ankle plantarflexion [facility] WFL     Ankle inversion       Ankle eversion        (Blank rows = not tested)  LOWER EXTREMITY MMT:  MMT Right eval Left eval  Hip flexion 2- 5  Hip extension 3+ 5  Hip abduction 3+ 5  Hip adduction    Hip internal rotation    Hip external rotation    Knee flexion  5  Knee extension  5  Ankle dorsiflexion 4 5  Ankle plantarflexion 4 5  Ankle inversion    Ankle eversion     (Blank rows = not tested)    FUNCTIONAL TESTS:  2 minute walk test: 216 ft done with knee brace and B axillary crutches  GAIT: Distance walked: 216 ft Assistive device utilized: Crutches Level of assistance: Complete Independence Comments: Modified 3-point pattern   TODAY'S TREATMENT:                                                                                                                              DATE:  11/10/22 Bike seat 11 x 5' for right knee mobility  Supine: SLR x 10 AROM right knee 0- 120  Standing: 4" step ups 2 x 10 4" lateral step ups 2 x 10 Heel raises/calf stretch on 8" step with emphasis on right 2  x 10 Rocker board F/B and S/S x 1' each Leg press bodycraft 3 plates 2 legs; 2 plates right leg only x 10 each Hamstring curls 3 plates both concentric; right only eccentric x 15   11/04/22 Bike seat 11 x 5' for right knee mobility  Standing: Wall slides 10" hold x 5 at 45 degree angle Heel/toe raises x 20 Slant board 5 x 20" 4" Lateral step ups 2 x 10 Lunge onto 4" step x 10 4" anterior step ups 2 x 10  Stool scoots x 100 ft x 2  TM 1.5 gait training x 4'    11/02/22 Bike seat 11 x 5' for right knee mobility  Slant board 5 x 20" Squats x 10 Knee drives for flexion x 2' on 12" step x 2' Hamstring stretch on 12" step x 2' Squats 2 x 10  4" step ups x 10 with 1 HHA 8" step ups x 5 with 1 HHA 4" step navigation ascend and descend with 1 HHA    10/28/22 Progress note  Bike seat 11 x 5' for right knee mobility FOTO 51 AROM  2 MWT no AD; wearing brace 388 ft  Standing: Heel/toe raises x 20 Slant board 5 x 20" Mini squat x 10 TKE GTB x 10  Supine: AROM right knee 0-115    10/26/22 Supine: Noted suture exposed left medial incision MD notified Heel slides with slight overpressure right knee flexion starting at 91; ending at 107 Quad sets 5 sec hold x 10 SLR 2 x 10  Bike seat 11 x 3' for mobility  Standing: Heel raises x 20 4" step ups x 15  Gait training with 1 crutch   10/02/22 Supine  Heel slides with a strap x 10 x 3 with 30" hold on the last rep of flexion  SLR 3-ways (flex, abd, ext) x  10 x 2 Prone:  Hamstring curls with ankle resting on bolster x 10 x 2 Gait training on level surface, CGA-SBA, 1 crutch on L, 2-point pattern, 1 lap around the clinic Stair negotiation (4" and 7" step), 1 crutch on L and opposite UE on railing, CGA-SBA, 3 rounds on 7" steps, 1 round on 4" steps  10/01/22 Supine Heelslides 10X   SLR 10X AROM 2 sets  Bridge 10X Side lying hip abduction Rt 10X 2 sets  Hip adduction Rt 10X X 2 sets Prone hip extension 10X 2  sets  Knee flexion 10X 2 sets Standing:  gait training with 1 crutch and brace locked at 30 degrees.   09/29/22 Supine quad sets 10X5"  Heelslides 10X (ROM 77 degrees)  SLR 5X AAROM, 5X AROM Side lying hip abduction Rt 10X  Hip adduction Rt 10X Prone hip extension 10X  Knee flexion 10X AROM today supine 0-77 Rt knee  09/18/2022 Evaluation and patient education done R Heel slides with strap x 10 R quads sets x 6" x 10    PATIENT EDUCATION:  Education details: Educated on the goals and course of rehab. Written HEP provided and reviewed Person educated: Patient Education method: Explanation, Demonstration, and Handouts Education comprehension: verbalized understanding and returned demonstration  HOME EXERCISE PROGRAM: Access Code: [email or web address] URL: [email or web address]  Date: 09/29/2022 Prepared by: Emeline Gins Exercises - Active Straight Leg Raise with Quad Set  - 2 x daily - 7 x weekly - 3 sets - 10 reps - Sidelying Hip Abduction  - 2 x daily - 7 x weekly - 3 sets - 10 reps - Sidelying Hip Adduction  - 3 x daily - 7 x weekly - 3 sets - 10 reps - Prone Hip Extension  - 3 x daily - 7 x weekly - 3 sets - 10 reps - Prone Knee Flexion AROM  - 3 x daily - 7 x weekly - 3 sets - 10 reps  Date: 09/18/2022 Prepared by: Krystal Clark Exercises - Supine Heel Slide with Strap  - 2 x daily - 7 x weekly - 3 sets - 10 reps - Supine Quad Set  - 2 x daily - 7 x weekly - 3 sets - 10 reps - 6 hold  ASSESSMENT:  CLINICAL IMPRESSION: Today's session continued to address right knee mobility and strength; progressed strengthening and balance exercise today with good challenge; patient nervous with new exercise but does well; encouraged SLR at home 30 at a time; continues with slight extension lag right versus left; hyperextends on left.  Overall making progress but still somewhat delayed due to lapse in treatment when she was on vacation for a few weeks.   To date, skilled PT is  required to address the impairments and improve function.   OBJECTIVE IMPAIRMENTS: Abnormal gait, decreased activity tolerance, decreased balance, difficulty walking, decreased ROM, decreased strength, impaired flexibility, and pain.   ACTIVITY LIMITATIONS: carrying, lifting, bending, sitting, squatting, sleeping, stairs, transfers, bed mobility, bathing, toileting, dressing, and hygiene/grooming  PARTICIPATION LIMITATIONS: meal prep, cleaning, laundry, driving, shopping, community activity, and occupation  PERSONAL FACTORS: Time since onset of injury/illness/exacerbation are also affecting patient's functional outcome.   REHAB POTENTIAL: Good  CLINICAL DECISION MAKING: Stable/uncomplicated  EVALUATION COMPLEXITY: Low   GOALS: Goals reviewed with patient? Yes  SHORT TERM GOALS: Target date: 10/09/2022 Pt will demonstrate indep in HEP to facilitate carry-over of skilled services and improve functional outcomes Goal status: IN PROGRESS  2.  Patient will demonstrate increase in knee  flex ROM to 120 degrees to facilitate ease in ambulation Baseline: 20 degrees Goal status: IN PROGRESS   LONG TERM GOALS: Target date: 10/30/2022  Pt will increase by at least 40 ft without assistive device in order to demonstrate clinically significant improvement in community ambulation Baseline: 216 ft Goal status: IN PROGRESS  2.  Pt will increase FOTO to the predicted pattern in order to demonstrate significant improvement in function related to ambulation  Baseline: 23.08 Goal status: IN PROGRESS  3.  Pt will demonstrate increase in LE strength to 5 to facilitate ease and safety in ambulation  Baseline: 2-/5 Goal status: IN PROGRESS  4.  Pt will be able to perform SLS on the R to 15 sec (firm surface, eyes open( to facilitate ease and safety and ambulation Baseline: not performed Goal status: IN PROGRESS  PLAN:  PT FREQUENCY: 3x/week  PT DURATION: 6 weeks  PLANNED  INTERVENTIONS: Therapeutic exercises, Therapeutic activity, Neuromuscular re-education, Balance training, Gait training, Patient/Family education, Self Care, Stair training, and Manual therapy  PLAN FOR NEXT SESSION: Continue POC and may progress per protocol with emphasis on ROM and strength.     3:35 PM, 11/10/22 Nickayla Mcinnis Small Gunhild Bautch MPT [facility] [place] [phone]

## 2022-11-12 ENCOUNTER — Ambulatory Visit (HOSPITAL_COMMUNITY): Payer: Self-pay | Admitting: Physical Therapy

## 2022-11-12 DIAGNOSIS — M25561 Pain in right knee: Secondary | ICD-10-CM

## 2022-11-12 DIAGNOSIS — R29898 Other symptoms and signs involving the musculoskeletal system: Secondary | ICD-10-CM

## 2022-11-12 DIAGNOSIS — R262 Difficulty in walking, not elsewhere classified: Secondary | ICD-10-CM

## 2022-11-12 NOTE — Therapy (Signed)
OUTPATIENT PHYSICAL THERAPY TREATMENT     Patient Name: Jay Wendorff MRN: [id number] DOB:August 05, 1985, 37 y.o., female Today's Date: 11/12/2022  END OF SESSION:  PT End of Session - 11/12/22 1640    Visit Number 9    Number of Visits 16    Date for PT Re-Evaluation 11/27/22    Authorization Type 50% [facility] [employer]    PT Start Time 1600    PT Stop Time 1640    PT Time Calculation (min) 40 min    Activity Tolerance Patient tolerated treatment well    Behavior During Therapy WFL for tasks assessed/performed         Past Medical History:  Diagnosis Date   Anemia    Hyperlipidemia    No pertinent past medical history    Past Surgical History:  Procedure Laterality Date   ANTERIOR CRUCIATE LIGAMENT REPAIR Right 09/08/2022   Procedure: ANTERIOR CRUCIATE LIGAMENT (ACL) REPAIR WITH QUADRICEPS AUTOGRAFT;  Surgeon: Vickki Hearing, MD;  Location: [facility];  Service: Orthopedics;  Laterality: Right;   BREAST LUMPECTOMY WITH RADIOACTIVE SEED LOCALIZATION Right 07/23/2020   Procedure: RIGHT BREAST LUMPECTOMYX 2  WITH RADIOACTIVE SEED LOCALIZATION;  Surgeon: Harriette Bouillon, MD;  Location: [facility];  Service: General;  Laterality: Right;   CESAREAN SECTION     CESAREAN SECTION WITH BILATERAL TUBAL LIGATION  07/11/2012   Procedure: CESAREAN SECTION WITH BILATERAL TUBAL LIGATION;  Surgeon: Tilda Burrow, MD;  Location: [facility];  Service: Obstetrics;  Laterality: Bilateral;  speaks spanish, interpreter needed   KNEE ARTHROSCOPY WITH MEDIAL MENISECTOMY Right 09/08/2022   Procedure: KNEE ARTHROSCOPY EXAM UNDER ANESTHESIA;  Surgeon: Vickki Hearing, MD;  Location: [facility];  Service: Orthopedics;  Laterality: Right;   TUBAL LIGATION     Patient Active Problem List   Diagnosis Date Noted   Rupture of anterior cruciate ligament of right knee 09/08/2022   Right shoulder pain 05/12/2022   History of DVT of lower extremity 05/12/2022   Encounter for  gynecological examination with Papanicolaou smear of cervix 05/12/2022   Right knee pain 05/12/2022   Dyspareunia, female 05/12/2022   DVT (deep venous thrombosis) (HCC) 03/16/2022   Itching of vulva 07/28/2021   Screening breast examination 01/24/2019   Left breast mass 01/24/2019   Menorrhagia with regular cycle 09/21/2018    PCP: Reather Converse, PA-C  REFERRING PROVIDER: Vickki Hearing, MD  REFERRING DIAG: [phone] (ICD-10-CM) - S/P ACL reconstruction  THERAPY DIAG:  Right knee pain, unspecified chronicity  Difficulty walking  Weakness of right lower extremity  Rationale for Evaluation and Treatment: Rehabilitation  ONSET DATE: 09/08/2022  SUBJECTIVE:   SUBJECTIVE STATEMENT: Pt states that she is not having any pain.  She is doing her exercises. PERTINENT HISTORY: Dizziness, blood clots PAIN:  Are you having pain? Yes: NPRS scale: 0/10 Pain location: in front of the R knee Pain description: pulsating Aggravating factors: moving the knee, walking Relieving factors: rest  PRECAUTIONS: Other: Can take the brace when laying down. Needs to wear the brace locked in ext for 4 weeks when upright  WEIGHT BEARING RESTRICTIONS: Yes WBAT  FALLS:  Has patient fallen in last 6 months? No  LIVING ENVIRONMENT: Lives with: lives with their family Lives in: Mobile home Stairs: Yes: External: 7 steps; can reach both Has following equipment at home: Crutches  OCCUPATION: in food preparation  PLOF: Independent  PATIENT GOALS: "to walk perfectly so I can get back to work"  NEXT MD VISIT: 11/26/22  OBJECTIVE:   DIAGNOSTIC FINDINGS:  MRI OF THE RIGHT KNEE WITHOUT CONTRAST 03/04/2022   IMPRESSION: 1.  Radial tear of the posterior horn of the lateral meniscus.   2. Thickening/partial-thickness tear of the medial collateral ligament,, likely a chronic process.   2. Edema and thickening of the anterior cruciate ligament without evidence of discrete tear suggesting  chronic ligamentous sprain.   3.  Moderate knee joint effusion.   4.  No evidence of fracture or osteonecrosis.  PATIENT SURVEYS:  FOTO 23.08  COGNITION: Overall cognitive status: Within functional limits for tasks assessed     SENSATION: Not tested  EDEMA:  Slight swelling on R knee. Bandage present without drainage. Knee brace locked in extension  MUSCLE LENGTH: Moderate restriction on B hamstrings Mild restriction on B hamstrings  POSTURE: rounded shoulders and forward head  PALPATION: Grade 1 tenderness on major bony landmarks on the R knee  LOWER EXTREMITY ROM:  Active ROM Right eval Left eval Right 09/29/22 Right 10/28/22 Right 11/10/22  Hip flexion [facility]     Hip extension [facility]     Hip abduction [facility]     Hip adduction       Hip internal rotation       Hip external rotation       Knee flexion 20 WFL 77 115 120  Knee extension 0 WFL 0 0 (left knee hyperextends) 0  Ankle dorsiflexion [facility] WFL     Ankle plantarflexion [facility] WFL     Ankle inversion       Ankle eversion        (Blank rows = not tested)  LOWER EXTREMITY MMT:  MMT Right eval Left eval  Hip flexion 2- 5  Hip extension 3+ 5  Hip abduction 3+ 5  Hip adduction    Hip internal rotation    Hip external rotation    Knee flexion  5  Knee extension  5  Ankle dorsiflexion 4 5  Ankle plantarflexion 4 5  Ankle inversion    Ankle eversion     (Blank rows = not tested)    FUNCTIONAL TESTS:  2 minute walk test: 216 ft done with knee brace and B axillary crutches  GAIT: Distance walked: 216 ft Assistive device utilized: Crutches Level of assistance: Complete Independence Comments: Modified 3-point pattern   TODAY'S TREATMENT:                                                                                                                              DATE:  11/12/22 Rocker board x 2 minutes RT/LT Heel raise x 15 Functional squat x 10  Single leg stance x 5  Step up 6" x 15 Step down  2" x 15  Wall sits x 10 hold for 5"  Stool scoot  x 2 RT  Slant board stretch 30" x 3  Walking backward x 2 RT  Prone knee flexion 3# x 10, 5# x  10  SLR with 3# x 10                                  11/10/22 Bike seat 11 x 5' for right knee mobility  Supine: SLR x 10 AROM right knee 0- 120  Standing: 4" step ups 2 x 10 4" lateral step ups 2 x 10 Heel raises/calf stretch on 8" step with emphasis on right 2 x 10 Rocker board F/B and S/S x 1' each Leg press bodycraft 3 plates 2 legs; 2 plates right leg only x 10 each Hamstring curls 3 plates both concentric; right only eccentric x 15   11/04/22 Bike seat 11 x 5' for right knee mobility  Standing: Wall slides 10" hold x 5 at 45 degree angle Heel/toe raises x 20 Slant board 5 x 20" 4" Lateral step ups 2 x 10 Lunge onto 4" step x 10 4" anterior step ups 2 x 10  Stool scoots x 100 ft x 2  TM 1.5 gait training x 4'    11/02/22 Bike seat 11 x 5' for right knee mobility  Slant board 5 x 20" Squats x 10 Knee drives for flexion x 2' on 12" step x 2' Hamstring stretch on 12" step x 2' Squats 2 x 10  4" step ups x 10 with 1 HHA 8" step ups x 5 with 1 HHA 4" step navigation ascend and descend with 1 HHA    10/28/22 Progress note  Bike seat 11 x 5' for right knee mobility FOTO 51 AROM  2 MWT no AD; wearing brace 388 ft  Standing: Heel/toe raises x 20 Slant board 5 x 20" Mini squat x 10 TKE GTB x 10  Supine: AROM right knee 0-115    PATIENT EDUCATION:  Education details: Educated on the goals and course of rehab. Written HEP provided and reviewed Person educated: Patient Education method: Explanation, Demonstration, and Handouts Education comprehension: verbalized understanding and returned demonstration  HOME EXERCISE PROGRAM: Access Code: [email or web address] URL: https- Mini Squat with Counter Support  - 1 x daily - 7 x weekly - 1 sets - 10 reps - 5" hold - Single Leg Stance  - 1 x daily - 7 x weekly - 1 sets - 5  reps - Backward Walking with Counter Support  - 1 x daily - 7 x weekly - 1 sets - 10 [email or web address]  Date: 09/29/2022 Prepared by: Emeline Gins Exercises - Active Straight Leg Raise with Quad Set  - 2 x daily - 7 x weekly - 3 sets - 10 reps - Sidelying Hip Abduction  - 2 x daily - 7 x weekly - 3 sets - 10 reps - Sidelying Hip Adduction  - 3 x daily - 7 x weekly - 3 sets - 10 reps - Prone Hip Extension  - 3 x daily - 7 x weekly - 3 sets - 10 reps - Prone Knee Flexion AROM  - 3 x daily - 7 x weekly - 3 sets - 10 reps  Date: 09/18/2022 Prepared by: Krystal Clark Exercises - Supine Heel Slide with Strap  - 2 x daily - 7 x weekly - 3 sets - 10 reps - Supine Quad Set  - 2 x daily - 7 x weekly - 3 sets - 10 reps - 6 hold  ASSESSMENT:  CLINICAL IMPRESSION:  Pt is 9 weeks post op.  Today focused on improving  proprioception and strengthening with pt able to demonstrate good technique of exercises with minimal verbal cuing.   Overall making progress but still somewhat delayed due to lapse in treatment when she was on vacation for a few weeks.   To date, skilled PT is required to address the impairments and improve function.   OBJECTIVE IMPAIRMENTS: Abnormal gait, decreased activity tolerance, decreased balance, difficulty walking, decreased ROM, decreased strength, impaired flexibility, and pain.   ACTIVITY LIMITATIONS: carrying, lifting, bending, sitting, squatting, sleeping, stairs, transfers, bed mobility, bathing, toileting, dressing, and hygiene/grooming  PARTICIPATION LIMITATIONS: meal prep, cleaning, laundry, driving, shopping, community activity, and occupation  PERSONAL FACTORS: Time since onset of injury/illness/exacerbation are also affecting patient's functional outcome.   REHAB POTENTIAL: Good  CLINICAL DECISION MAKING: Stable/uncomplicated  EVALUATION COMPLEXITY: Low   GOALS: Goals reviewed with patient? Yes  SHORT TERM GOALS: Target date:  10/09/2022 Pt will demonstrate indep in HEP to facilitate carry-over of skilled services and improve functional outcomes Goal status: IN PROGRESS  2.  Patient will demonstrate increase in knee flex ROM to 120 degrees to facilitate ease in ambulation Baseline: 20 degrees Goal status: IN PROGRESS   LONG TERM GOALS: Target date: 10/30/2022  Pt will increase by at least 40 ft without assistive device in order to demonstrate clinically significant improvement in community ambulation Baseline: 216 ft Goal status: IN PROGRESS  2.  Pt will increase FOTO to the predicted pattern in order to demonstrate significant improvement in function related to ambulation  Baseline: 23.08 Goal status: IN PROGRESS  3.  Pt will demonstrate increase in LE strength to 5 to facilitate ease and safety in ambulation  Baseline: 2-/5 Goal status: IN PROGRESS  4.  Pt will be able to perform SLS on the R to 15 sec (firm surface, eyes open( to facilitate ease and safety and ambulation Baseline: not performed Goal status: IN PROGRESS  PLAN:  PT FREQUENCY: 3x/week  PT DURATION: 6 weeks  PLANNED INTERVENTIONS: Therapeutic exercises, Therapeutic activity, Neuromuscular re-education, Balance training, Gait training, Patient/Family education, Self Care, Stair training, and Manual therapy  PLAN FOR NEXT SESSION: Continue POC and may progress per protocol with emphasis on ROM and strength.     4:36 PM, 11/12/22 Amy Small Lynch MPT [facility] [place] [phone]

## 2022-11-17 ENCOUNTER — Ambulatory Visit (HOSPITAL_COMMUNITY): Payer: Self-pay

## 2022-11-17 DIAGNOSIS — M25561 Pain in right knee: Secondary | ICD-10-CM

## 2022-11-17 DIAGNOSIS — R29898 Other symptoms and signs involving the musculoskeletal system: Secondary | ICD-10-CM

## 2022-11-17 DIAGNOSIS — R262 Difficulty in walking, not elsewhere classified: Secondary | ICD-10-CM

## 2022-11-17 NOTE — Therapy (Signed)
OUTPATIENT PHYSICAL THERAPY TREATMENT     Patient Name: Meike Brasington MRN: [id number] DOB:Aug 27, 1985, 37 y.o., female Today's Date: 11/17/2022  END OF SESSION:   PT End of Session - 11/17/22 1307     Visit Number 10    Number of Visits 16    Date for PT Re-Evaluation 11/27/22    Authorization Type 50% [facility] [employer]    PT Start Time 1305    PT Stop Time 1345    PT Time Calculation (min) 40 min    Activity Tolerance Patient tolerated treatment well    Behavior During Therapy WFL for tasks assessed/performed              Past Medical History:  Diagnosis Date   Anemia    Hyperlipidemia    No pertinent past medical history    Past Surgical History:  Procedure Laterality Date   ANTERIOR CRUCIATE LIGAMENT REPAIR Right 09/08/2022   Procedure: ANTERIOR CRUCIATE LIGAMENT (ACL) REPAIR WITH QUADRICEPS AUTOGRAFT;  Surgeon: Vickki Hearing, MD;  Location: [facility];  Service: Orthopedics;  Laterality: Right;   BREAST LUMPECTOMY WITH RADIOACTIVE SEED LOCALIZATION Right 07/23/2020   Procedure: RIGHT BREAST LUMPECTOMYX 2  WITH RADIOACTIVE SEED LOCALIZATION;  Surgeon: Harriette Bouillon, MD;  Location: [facility];  Service: General;  Laterality: Right;   CESAREAN SECTION     CESAREAN SECTION WITH BILATERAL TUBAL LIGATION  07/11/2012   Procedure: CESAREAN SECTION WITH BILATERAL TUBAL LIGATION;  Surgeon: Tilda Burrow, MD;  Location: [facility];  Service: Obstetrics;  Laterality: Bilateral;  speaks spanish, interpreter needed   KNEE ARTHROSCOPY WITH MEDIAL MENISECTOMY Right 09/08/2022   Procedure: KNEE ARTHROSCOPY EXAM UNDER ANESTHESIA;  Surgeon: Vickki Hearing, MD;  Location: [facility];  Service: Orthopedics;  Laterality: Right;   TUBAL LIGATION     Patient Active Problem List   Diagnosis Date Noted   Rupture of anterior cruciate ligament of right knee 09/08/2022   Right shoulder pain 05/12/2022   History of DVT of lower extremity 05/12/2022    Encounter for gynecological examination with Papanicolaou smear of cervix 05/12/2022   Right knee pain 05/12/2022   Dyspareunia, female 05/12/2022   DVT (deep venous thrombosis) (HCC) 03/16/2022   Itching of vulva 07/28/2021   Screening breast examination 01/24/2019   Left breast mass 01/24/2019   Menorrhagia with regular cycle 09/21/2018    PCP: Reather Converse, PA-C  REFERRING PROVIDER: Vickki Hearing, MD  REFERRING DIAG: [phone] (ICD-10-CM) - S/P ACL reconstruction  THERAPY DIAG:  Right knee pain, unspecified chronicity  Difficulty walking  Weakness of right lower extremity  Rationale for Evaluation and Treatment: Rehabilitation  ONSET DATE: 09/08/2022  SUBJECTIVE:   SUBJECTIVE STATEMENT: No compliant of pain; feels brace causes some swelling sometimes; arrives with interpreter "Wallene Huh" and daughter  PERTINENT HISTORY: Dizziness, blood clots PAIN:  Are you having pain? Yes: NPRS scale: 0/10 Pain location: in front of the R knee Pain description: pulsating Aggravating factors: moving the knee, walking Relieving factors: rest  PRECAUTIONS: Other: Can take the brace when laying down. Needs to wear the brace locked in ext for 4 weeks when upright  WEIGHT BEARING RESTRICTIONS: Yes WBAT  FALLS:  Has patient fallen in last 6 months? No  LIVING ENVIRONMENT: Lives with: lives with their family Lives in: Mobile home Stairs: Yes: External: 7 steps; can reach both Has following equipment at home: Crutches  OCCUPATION: in food preparation  PLOF: Independent  PATIENT GOALS: "to walk perfectly so I can get  back to work"  NEXT MD VISIT: 11/26/22  OBJECTIVE:   DIAGNOSTIC FINDINGS:  MRI OF THE RIGHT KNEE WITHOUT CONTRAST 03/04/2022   IMPRESSION: 1.  Radial tear of the posterior horn of the lateral meniscus.   2. Thickening/partial-thickness tear of the medial collateral ligament,, likely a chronic process.   2. Edema and thickening of the anterior cruciate  ligament without evidence of discrete tear suggesting chronic ligamentous sprain.   3.  Moderate knee joint effusion.   4.  No evidence of fracture or osteonecrosis.  PATIENT SURVEYS:  FOTO 23.08  COGNITION: Overall cognitive status: Within functional limits for tasks assessed     SENSATION: Not tested  EDEMA:  Slight swelling on R knee. Bandage present without drainage. Knee brace locked in extension  MUSCLE LENGTH: Moderate restriction on B hamstrings Mild restriction on B hamstrings  POSTURE: rounded shoulders and forward head  PALPATION: Grade 1 tenderness on major bony landmarks on the R knee  LOWER EXTREMITY ROM:  Active ROM Right eval Left eval Right 09/29/22 Right 10/28/22 Right 11/10/22  Hip flexion [facility]     Hip extension [facility]     Hip abduction [facility]     Hip adduction       Hip internal rotation       Hip external rotation       Knee flexion 20 WFL 77 115 120  Knee extension 0 WFL 0 0 (left knee hyperextends) 0  Ankle dorsiflexion [facility] WFL     Ankle plantarflexion [facility] WFL     Ankle inversion       Ankle eversion        (Blank rows = not tested)  LOWER EXTREMITY MMT:  MMT Right eval Left eval  Hip flexion 2- 5  Hip extension 3+ 5  Hip abduction 3+ 5  Hip adduction    Hip internal rotation    Hip external rotation    Knee flexion  5  Knee extension  5  Ankle dorsiflexion 4 5  Ankle plantarflexion 4 5  Ankle inversion    Ankle eversion     (Blank rows = not tested)    FUNCTIONAL TESTS:  2 minute walk test: 216 ft done with knee brace and B axillary crutches  GAIT: Distance walked: 216 ft Assistive device utilized: Crutches Level of assistance: Complete Independence Comments: Modified 3-point pattern   TODAY'S TREATMENT:                                                                                                                              DATE:  11/17/22 Seat 9 bike x 5' level 2 dynamic warm up  Standing: Heel/toe  raises 2 x 10 Squats 2 x 10 6" step ups 2 x 10 Bodycraft TKE's 2 plates x 20 Walkouts 2 plates retro x 10  Trial of kineotape for swelling right knee      11/12/22 Rocker board x 2 minutes RT/LT  Heel raise x 15 Functional squat x 10  Single leg stance x 5  Step up 6" x 15 Step down 2" x 15  Wall sits x 10 hold for 5"  Stool scoot  x 2 RT  Slant board stretch 30" x 3  Walking backward x 2 RT  Prone knee flexion 3# x 10, 5# x 10  SLR with 3# x 10                                  11/10/22 Bike seat 11 x 5' for right knee mobility  Supine: SLR x 10 AROM right knee 0- 120  Standing: 4" step ups 2 x 10 4" lateral step ups 2 x 10 Heel raises/calf stretch on 8" step with emphasis on right 2 x 10 Rocker board F/B and S/S x 1' each Leg press bodycraft 3 plates 2 legs; 2 plates right leg only x 10 each Hamstring curls 3 plates both concentric; right only eccentric x 15   11/04/22 Bike seat 11 x 5' for right knee mobility  Standing: Wall slides 10" hold x 5 at 45 degree angle Heel/toe raises x 20 Slant board 5 x 20" 4" Lateral step ups 2 x 10 Lunge onto 4" step x 10 4" anterior step ups 2 x 10  Stool scoots x 100 ft x 2  TM 1.5 gait training x 4'    11/02/22 Bike seat 11 x 5' for right knee mobility  Slant board 5 x 20" Squats x 10 Knee drives for flexion x 2' on 12" step x 2' Hamstring stretch on 12" step x 2' Squats 2 x 10  4" step ups x 10 with 1 HHA 8" step ups x 5 with 1 HHA 4" step navigation ascend and descend with 1 HHA    10/28/22 Progress note  Bike seat 11 x 5' for right knee mobility FOTO 51 AROM  2 MWT no AD; wearing brace 388 ft  Standing: Heel/toe raises x 20 Slant board 5 x 20" Mini squat x 10 TKE GTB x 10  Supine: AROM right knee 0-115    PATIENT EDUCATION:  Education details: Educated on the goals and course of rehab. Written HEP provided and reviewed Person educated: Patient Education method: Explanation, Demonstration, and  Handouts Education comprehension: verbalized understanding and returned demonstration  HOME EXERCISE PROGRAM: Access Code: [email or web address] URL: https- Mini Squat with Counter Support  - 1 x daily - 7 x weekly - 1 sets - 10 reps - 5" hold - Single Leg Stance  - 1 x daily - 7 x weekly - 1 sets - 5 reps - Backward Walking with Counter Support  - 1 x daily - 7 x weekly - 1 sets - 10 [email or web address]  Date: 09/29/2022 Prepared by: Emeline Gins Exercises - Active Straight Leg Raise with Quad Set  - 2 x daily - 7 x weekly - 3 sets - 10 reps - Sidelying Hip Abduction  - 2 x daily - 7 x weekly - 3 sets - 10 reps - Sidelying Hip Adduction  - 3 x daily - 7 x weekly - 3 sets - 10 reps - Prone Hip Extension  - 3 x daily - 7 x weekly - 3 sets - 10 reps - Prone Knee Flexion AROM  - 3 x daily - 7 x weekly - 3 sets - 10 reps  Date: 09/18/2022 Prepared by: Iantha Fallen  [place] Exercises - Supine Heel Slide with Strap  - 2 x daily - 7 x weekly - 3 sets - 10 reps - Supine Quad Set  - 2 x daily - 7 x weekly - 3 sets - 10 reps - 6 hold  ASSESSMENT:  CLINICAL IMPRESSION:  Today's session continued to work on right knee strengthening and mobility per protocol.  Added walkouts today for balance and strength; of note she continues with swelling anterior knee which is likely inhibiting her total knee extension strength.  Trial of kinesiotape to decrease swelling.  To date, skilled PT is required to address the impairments and improve function.   OBJECTIVE IMPAIRMENTS: Abnormal gait, decreased activity tolerance, decreased balance, difficulty walking, decreased ROM, decreased strength, impaired flexibility, and pain.   ACTIVITY LIMITATIONS: carrying, lifting, bending, sitting, squatting, sleeping, stairs, transfers, bed mobility, bathing, toileting, dressing, and hygiene/grooming  PARTICIPATION LIMITATIONS: meal prep, cleaning, laundry, driving, shopping, community activity, and  occupation  PERSONAL FACTORS: Time since onset of injury/illness/exacerbation are also affecting patient's functional outcome.   REHAB POTENTIAL: Good  CLINICAL DECISION MAKING: Stable/uncomplicated  EVALUATION COMPLEXITY: Low   GOALS: Goals reviewed with patient? Yes  SHORT TERM GOALS: Target date: 10/09/2022 Pt will demonstrate indep in HEP to facilitate carry-over of skilled services and improve functional outcomes Goal status: IN PROGRESS  2.  Patient will demonstrate increase in knee flex ROM to 120 degrees to facilitate ease in ambulation Baseline: 20 degrees Goal status: IN PROGRESS   LONG TERM GOALS: Target date: 10/30/2022  Pt will increase by at least 40 ft without assistive device in order to demonstrate clinically significant improvement in community ambulation Baseline: 216 ft Goal status: IN PROGRESS  2.  Pt will increase FOTO to the predicted pattern in order to demonstrate significant improvement in function related to ambulation  Baseline: 23.08 Goal status: IN PROGRESS  3.  Pt will demonstrate increase in LE strength to 5 to facilitate ease and safety in ambulation  Baseline: 2-/5 Goal status: IN PROGRESS  4.  Pt will be able to perform SLS on the R to 15 sec (firm surface, eyes open( to facilitate ease and safety and ambulation Baseline: not performed Goal status: IN PROGRESS  PLAN:  PT FREQUENCY: 3x/week  PT DURATION: 6 weeks  PLANNED INTERVENTIONS: Therapeutic exercises, Therapeutic activity, Neuromuscular re-education, Balance training, Gait training, Patient/Family education, Self Care, Stair training, and Manual therapy  PLAN FOR NEXT SESSION: Continue POC and may progress per protocol with emphasis on ROM and strength.     1:49 PM, 11/17/22 Danell Verno Small Kaidin Boehle MPT [facility] [place] [phone] Ph:[phone]

## 2022-11-19 ENCOUNTER — Ambulatory Visit (HOSPITAL_COMMUNITY): Payer: Self-pay

## 2022-11-19 DIAGNOSIS — R262 Difficulty in walking, not elsewhere classified: Secondary | ICD-10-CM

## 2022-11-19 DIAGNOSIS — R29898 Other symptoms and signs involving the musculoskeletal system: Secondary | ICD-10-CM

## 2022-11-19 DIAGNOSIS — M25561 Pain in right knee: Secondary | ICD-10-CM

## 2022-11-19 NOTE — Therapy (Signed)
OUTPATIENT PHYSICAL THERAPY TREATMENT     Patient Name: Kristine Adams MRN: [id number] DOB:1986-05-12, 37 y.o., female Today's Date: 11/19/2022  END OF SESSION:   PT End of Session - 11/19/22 1301     Visit Number 11    Number of Visits 16    Date for PT Re-Evaluation 11/27/22    Authorization Type 50% [facility] [employer]    PT Start Time 1301    PT Stop Time 1344    PT Time Calculation (min) 43 min    Activity Tolerance Patient tolerated treatment well    Behavior During Therapy WFL for tasks assessed/performed              Past Medical History:  Diagnosis Date   Anemia    Hyperlipidemia    No pertinent past medical history    Past Surgical History:  Procedure Laterality Date   ANTERIOR CRUCIATE LIGAMENT REPAIR Right 09/08/2022   Procedure: ANTERIOR CRUCIATE LIGAMENT (ACL) REPAIR WITH QUADRICEPS AUTOGRAFT;  Surgeon: Vickki Hearing, MD;  Location: [facility];  Service: Orthopedics;  Laterality: Right;   BREAST LUMPECTOMY WITH RADIOACTIVE SEED LOCALIZATION Right 07/23/2020   Procedure: RIGHT BREAST LUMPECTOMYX 2  WITH RADIOACTIVE SEED LOCALIZATION;  Surgeon: Harriette Bouillon, MD;  Location: [facility];  Service: General;  Laterality: Right;   CESAREAN SECTION     CESAREAN SECTION WITH BILATERAL TUBAL LIGATION  07/11/2012   Procedure: CESAREAN SECTION WITH BILATERAL TUBAL LIGATION;  Surgeon: Tilda Burrow, MD;  Location: [facility];  Service: Obstetrics;  Laterality: Bilateral;  speaks spanish, interpreter needed   KNEE ARTHROSCOPY WITH MEDIAL MENISECTOMY Right 09/08/2022   Procedure: KNEE ARTHROSCOPY EXAM UNDER ANESTHESIA;  Surgeon: Vickki Hearing, MD;  Location: [facility];  Service: Orthopedics;  Laterality: Right;   TUBAL LIGATION     Patient Active Problem List   Diagnosis Date Noted   Rupture of anterior cruciate ligament of right knee 09/08/2022   Right shoulder pain 05/12/2022   History of DVT of lower extremity 05/12/2022    Encounter for gynecological examination with Papanicolaou smear of cervix 05/12/2022   Right knee pain 05/12/2022   Dyspareunia, female 05/12/2022   DVT (deep venous thrombosis) (HCC) 03/16/2022   Itching of vulva 07/28/2021   Screening breast examination 01/24/2019   Left breast mass 01/24/2019   Menorrhagia with regular cycle 09/21/2018    PCP: Reather Converse, PA-C  REFERRING PROVIDER: Vickki Hearing, MD  REFERRING DIAG: [phone] (ICD-10-CM) - S/P ACL reconstruction  THERAPY DIAG:  Right knee pain, unspecified chronicity  Difficulty walking  Weakness of right lower extremity  Rationale for Evaluation and Treatment: Rehabilitation  ONSET DATE: 09/08/2022  SUBJECTIVE:   SUBJECTIVE STATEMENT: Patient reports no pain; tape helped with swelling and would like to learn how to tape herself PERTINENT HISTORY: Dizziness, blood clots PAIN:  Are you having pain? Yes: NPRS scale: 0/10 Pain location: in front of the R knee Pain description: pulsating Aggravating factors: moving the knee, walking Relieving factors: rest  PRECAUTIONS: Other: Can take the brace when laying down. Needs to wear the brace locked in ext for 4 weeks when upright  WEIGHT BEARING RESTRICTIONS: Yes WBAT  FALLS:  Has patient fallen in last 6 months? No  LIVING ENVIRONMENT: Lives with: lives with their family Lives in: Mobile home Stairs: Yes: External: 7 steps; can reach both Has following equipment at home: Crutches  OCCUPATION: in food preparation  PLOF: Independent  PATIENT GOALS: "to walk perfectly so I can get  back to work"  NEXT MD VISIT: 11/26/22  OBJECTIVE:   DIAGNOSTIC FINDINGS:  MRI OF THE RIGHT KNEE WITHOUT CONTRAST 03/04/2022   IMPRESSION: 1.  Radial tear of the posterior horn of the lateral meniscus.   2. Thickening/partial-thickness tear of the medial collateral ligament,, likely a chronic process.   2. Edema and thickening of the anterior cruciate ligament  without evidence of discrete tear suggesting chronic ligamentous sprain.   3.  Moderate knee joint effusion.   4.  No evidence of fracture or osteonecrosis.  PATIENT SURVEYS:  FOTO 23.08  COGNITION: Overall cognitive status: Within functional limits for tasks assessed     SENSATION: Not tested  EDEMA:  Slight swelling on R knee. Bandage present without drainage. Knee brace locked in extension  MUSCLE LENGTH: Moderate restriction on B hamstrings Mild restriction on B hamstrings  POSTURE: rounded shoulders and forward head  PALPATION: Grade 1 tenderness on major bony landmarks on the R knee  LOWER EXTREMITY ROM:  Active ROM Right eval Left eval Right 09/29/22 Right 10/28/22 Right 11/10/22  Hip flexion [facility]     Hip extension [facility]     Hip abduction [facility]     Hip adduction       Hip internal rotation       Hip external rotation       Knee flexion 20 WFL 77 115 120  Knee extension 0 WFL 0 0 (left knee hyperextends) 0  Ankle dorsiflexion [facility] WFL     Ankle plantarflexion [facility] WFL     Ankle inversion       Ankle eversion        (Blank rows = not tested)  LOWER EXTREMITY MMT:  MMT Right eval Left eval  Hip flexion 2- 5  Hip extension 3+ 5  Hip abduction 3+ 5  Hip adduction    Hip internal rotation    Hip external rotation    Knee flexion  5  Knee extension  5  Ankle dorsiflexion 4 5  Ankle plantarflexion 4 5  Ankle inversion    Ankle eversion     (Blank rows = not tested)    FUNCTIONAL TESTS:  2 minute walk test: 216 ft done with knee brace and B axillary crutches  GAIT: Distance walked: 216 ft Assistive device utilized: Crutches Level of assistance: Complete Independence Comments: Modified 3-point pattern   TODAY'S TREATMENT:                                                                                                                              DATE:  11/19/22 Seat 9 bike x 5' level 3 dynamic warm up  Instruction/ taping the knee for  swelling/ kinesiotape Standing: Heel/toe raises on incline x 20 Squats x 20 8" box step ups 2 x 10 Bodycraft TKE 3 plates x 20 with 1 UE assist Walkouts 3 plates retro x 10    11/17/22 Seat 9 bike x  5' level 2 dynamic warm up  Standing: Heel/toe raises 2 x 10 Squats 2 x 10 6" step ups 2 x 10 Bodycraft TKE's 2 plates x 20 Walkouts 2 plates retro x 10  Trial of kineotape for swelling right knee      11/12/22 Rocker board x 2 minutes RT/LT Heel raise x 15 Functional squat x 10  Single leg stance x 5  Step up 6" x 15 Step down 2" x 15  Wall sits x 10 hold for 5"  Stool scoot  x 2 RT  Slant board stretch 30" x 3  Walking backward x 2 RT  Prone knee flexion 3# x 10, 5# x 10  SLR with 3# x 10                                  11/10/22 Bike seat 11 x 5' for right knee mobility  Supine: SLR x 10 AROM right knee 0- 120  Standing: 4" step ups 2 x 10 4" lateral step ups 2 x 10 Heel raises/calf stretch on 8" step with emphasis on right 2 x 10 Rocker board F/B and S/S x 1' each Leg press bodycraft 3 plates 2 legs; 2 plates right leg only x 10 each Hamstring curls 3 plates both concentric; right only eccentric x 15   11/04/22 Bike seat 11 x 5' for right knee mobility  Standing: Wall slides 10" hold x 5 at 45 degree angle Heel/toe raises x 20 Slant board 5 x 20" 4" Lateral step ups 2 x 10 Lunge onto 4" step x 10 4" anterior step ups 2 x 10  Stool scoots x 100 ft x 2  TM 1.5 gait training x 4'    11/02/22 Bike seat 11 x 5' for right knee mobility  Slant board 5 x 20" Squats x 10 Knee drives for flexion x 2' on 12" step x 2' Hamstring stretch on 12" step x 2' Squats 2 x 10  4" step ups x 10 with 1 HHA 8" step ups x 5 with 1 HHA 4" step navigation ascend and descend with 1 HHA    10/28/22 Progress note  Bike seat 11 x 5' for right knee mobility FOTO 51 AROM  2 MWT no AD; wearing brace 388 ft  Standing: Heel/toe raises x 20 Slant board 5 x 20" Mini  squat x 10 TKE GTB x 10  Supine: AROM right knee 0-115    PATIENT EDUCATION:  Education details: Educated on the goals and course of rehab. Written HEP provided and reviewed Person educated: Patient Education method: Explanation, Demonstration, and Handouts Education comprehension: verbalized understanding and returned demonstration  HOME EXERCISE PROGRAM: Access Code: [email or web address] URL: https- Mini Squat with Counter Support  - 1 x daily - 7 x weekly - 1 sets - 10 reps - 5" hold - Single Leg Stance  - 1 x daily - 7 x weekly - 1 sets - 5 reps - Backward Walking with Counter Support  - 1 x daily - 7 x weekly - 1 sets - 10 [email or web address] [email or web address]  Date: 09/29/2022 Prepared by: Emeline Gins Exercises - Active Straight Leg Raise with Quad Set  - 2 x daily - 7 x weekly - 3 sets - 10 reps - Sidelying Hip Abduction  - 2 x daily - 7 x weekly - 3 sets - 10 reps - Sidelying Hip Adduction  - 3 x daily -  7 x weekly - 3 sets - 10 reps - Prone Hip Extension  - 3 x daily - 7 x weekly - 3 sets - 10 reps - Prone Knee Flexion AROM  - 3 x daily - 7 x weekly - 3 sets - 10 reps  Date: 09/18/2022 Prepared by: Krystal Clark Exercises - Supine Heel Slide with Strap  - 2 x daily - 7 x weekly - 3 sets - 10 reps - Supine Quad Set  - 2 x daily - 7 x weekly - 3 sets - 10 reps - 6 hold  ASSESSMENT:  CLINICAL IMPRESSION:  Today's session continued to work on right knee strengthening and mobility per protocol.  Increased weight with TKE's and walkouts today for balance and strength; instructed patient and her daughter in how to tape her knee with kinesiotape for swelling; she has purchased her own tape.  Increased step height with step ups to 8".  To date, skilled PT is required to address the impairments and improve function.   OBJECTIVE IMPAIRMENTS: Abnormal gait, decreased activity tolerance, decreased balance, difficulty walking, decreased ROM, decreased strength, impaired flexibility, and  pain.   ACTIVITY LIMITATIONS: carrying, lifting, bending, sitting, squatting, sleeping, stairs, transfers, bed mobility, bathing, toileting, dressing, and hygiene/grooming  PARTICIPATION LIMITATIONS: meal prep, cleaning, laundry, driving, shopping, community activity, and occupation  PERSONAL FACTORS: Time since onset of injury/illness/exacerbation are also affecting patient's functional outcome.   REHAB POTENTIAL: Good  CLINICAL DECISION MAKING: Stable/uncomplicated  EVALUATION COMPLEXITY: Low   GOALS: Goals reviewed with patient? Yes  SHORT TERM GOALS: Target date: 10/09/2022 Pt will demonstrate indep in HEP to facilitate carry-over of skilled services and improve functional outcomes Goal status: IN PROGRESS  2.  Patient will demonstrate increase in knee flex ROM to 120 degrees to facilitate ease in ambulation Baseline: 20 degrees Goal status: IN PROGRESS   LONG TERM GOALS: Target date: 10/30/2022  Pt will increase by at least 40 ft without assistive device in order to demonstrate clinically significant improvement in community ambulation Baseline: 216 ft Goal status: IN PROGRESS  2.  Pt will increase FOTO to the predicted pattern in order to demonstrate significant improvement in function related to ambulation  Baseline: 23.08 Goal status: IN PROGRESS  3.  Pt will demonstrate increase in LE strength to 5 to facilitate ease and safety in ambulation  Baseline: 2-/5 Goal status: IN PROGRESS  4.  Pt will be able to perform SLS on the R to 15 sec (firm surface, eyes open( to facilitate ease and safety and ambulation Baseline: not performed Goal status: IN PROGRESS  PLAN:  PT FREQUENCY: 3x/week  PT DURATION: 6 weeks  PLANNED INTERVENTIONS: Therapeutic exercises, Therapeutic activity, Neuromuscular re-education, Balance training, Gait training, Patient/Family education, Self Care, Stair training, and Manual therapy  PLAN FOR NEXT SESSION: Continue POC and may  progress per protocol with emphasis on ROM and strength.     1:02 PM, 11/19/22 Lailah Marcelli Small Alese Furniss MPT  physical therapy [place] [phone] [phone]

## 2022-11-23 ENCOUNTER — Ambulatory Visit (HOSPITAL_COMMUNITY): Payer: Self-pay | Attending: Orthopedic Surgery

## 2022-11-23 DIAGNOSIS — R29898 Other symptoms and signs involving the musculoskeletal system: Secondary | ICD-10-CM | POA: Insufficient documentation

## 2022-11-23 DIAGNOSIS — M25561 Pain in right knee: Secondary | ICD-10-CM | POA: Insufficient documentation

## 2022-11-23 DIAGNOSIS — R262 Difficulty in walking, not elsewhere classified: Secondary | ICD-10-CM | POA: Insufficient documentation

## 2022-11-23 NOTE — Therapy (Addendum)
OUTPATIENT PHYSICAL THERAPY TREATMENT/PROGRESS NOTE Progress Note Reporting Period 09/18/2022 to 11/23/2022  See note below for Objective Data and Assessment of Progress/Goals.         Patient Name: Kristine Adams MRN: [id number] DOB:1985-09-28, 37 y.o., female Today's Date: 11/23/2022  END OF SESSION:   PT End of Session - 11/23/22 1307     Visit Number 12    Number of Visits 16    Date for PT Re-Evaluation 11/27/22    Authorization Type 50% [facility] [employer]    PT Start Time 1306    PT Stop Time 1345    PT Time Calculation (min) 39 min    Activity Tolerance Patient tolerated treatment well    Behavior During Therapy WFL for tasks assessed/performed              Past Medical History:  Diagnosis Date   Anemia    Hyperlipidemia    No pertinent past medical history    Past Surgical History:  Procedure Laterality Date   ANTERIOR CRUCIATE LIGAMENT REPAIR Right 09/08/2022   Procedure: ANTERIOR CRUCIATE LIGAMENT (ACL) REPAIR WITH QUADRICEPS AUTOGRAFT;  Surgeon: Vickki Hearing, MD;  Location: [facility];  Service: Orthopedics;  Laterality: Right;   BREAST LUMPECTOMY WITH RADIOACTIVE SEED LOCALIZATION Right 07/23/2020   Procedure: RIGHT BREAST LUMPECTOMYX 2  WITH RADIOACTIVE SEED LOCALIZATION;  Surgeon: Harriette Bouillon, MD;  Location: [facility];  Service: General;  Laterality: Right;   CESAREAN SECTION     CESAREAN SECTION WITH BILATERAL TUBAL LIGATION  07/11/2012   Procedure: CESAREAN SECTION WITH BILATERAL TUBAL LIGATION;  Surgeon: Tilda Burrow, MD;  Location: [facility];  Service: Obstetrics;  Laterality: Bilateral;  speaks spanish, interpreter needed   KNEE ARTHROSCOPY WITH MEDIAL MENISECTOMY Right 09/08/2022   Procedure: KNEE ARTHROSCOPY EXAM UNDER ANESTHESIA;  Surgeon: Vickki Hearing, MD;  Location: [facility];  Service: Orthopedics;  Laterality: Right;   TUBAL LIGATION     Patient Active Problem List   Diagnosis Date Noted    Rupture of anterior cruciate ligament of right knee 09/08/2022   Right shoulder pain 05/12/2022   History of DVT of lower extremity 05/12/2022   Encounter for gynecological examination with Papanicolaou smear of cervix 05/12/2022   Right knee pain 05/12/2022   Dyspareunia, female 05/12/2022   DVT (deep venous thrombosis) (HCC) 03/16/2022   Itching of vulva 07/28/2021   Screening breast examination 01/24/2019   Left breast mass 01/24/2019   Menorrhagia with regular cycle 09/21/2018    PCP: Reather Converse, PA-C  REFERRING PROVIDER: Vickki Hearing, MD  REFERRING DIAG: [phone] (ICD-10-CM) - S/P ACL reconstruction  THERAPY DIAG:  Right knee pain, unspecified chronicity  Weakness of right lower extremity  Difficulty walking  Rationale for Evaluation and Treatment: Rehabilitation  ONSET DATE: 09/08/2022  SUBJECTIVE:   SUBJECTIVE STATEMENT: Tape helps with swelling; still has most difficulty with steps PERTINENT HISTORY: Dizziness, blood clots PAIN:  Are you having pain? Yes: NPRS scale: 0/10 Pain location: in front of the R knee Pain description: pulsating Aggravating factors: moving the knee, walking Relieving factors: rest  PRECAUTIONS: Other: Can take the brace when laying down. Needs to wear the brace locked in ext for 4 weeks when upright  WEIGHT BEARING RESTRICTIONS: Yes WBAT  FALLS:  Has patient fallen in last 6 months? No  LIVING ENVIRONMENT: Lives with: lives with their family Lives in: Mobile home Stairs: Yes: External: 7 steps; can reach both Has following equipment at home: Crutches  OCCUPATION:  in food preparation  PLOF: Independent  PATIENT GOALS: "to walk perfectly so I can get back to work"  NEXT MD VISIT: 11/26/22  OBJECTIVE:   DIAGNOSTIC FINDINGS:  MRI OF THE RIGHT KNEE WITHOUT CONTRAST 03/04/2022   IMPRESSION: 1.  Radial tear of the posterior horn of the lateral meniscus.   2. Thickening/partial-thickness tear of the medial  collateral ligament,, likely a chronic process.   2. Edema and thickening of the anterior cruciate ligament without evidence of discrete tear suggesting chronic ligamentous sprain.   3.  Moderate knee joint effusion.   4.  No evidence of fracture or osteonecrosis.  PATIENT SURVEYS:  FOTO 23.08  COGNITION: Overall cognitive status: Within functional limits for tasks assessed     SENSATION: Not tested  EDEMA:  Slight swelling on R knee. Bandage present without drainage. Knee brace locked in extension  MUSCLE LENGTH: Moderate restriction on B hamstrings Mild restriction on B hamstrings  POSTURE: rounded shoulders and forward head  PALPATION: Grade 1 tenderness on major bony landmarks on the R knee  LOWER EXTREMITY ROM:  Active ROM Right eval Left eval Right 09/29/22 Right 10/28/22 Right 11/10/22 Right 11/23/22  Hip flexion [facility]      Hip extension [facility]      Hip abduction [facility]      Hip adduction        Hip internal rotation        Hip external rotation        Knee flexion 20 WFL 77 115 120 122  Knee extension 0 WFL 0 0 (left knee hyperextends) 0 0  Ankle dorsiflexion [facility]      Ankle plantarflexion [facility] WFL      Ankle inversion        Ankle eversion         (Blank rows = not tested)  LOWER EXTREMITY MMT:  MMT Right eval Left eval Right 11/23/22  Hip flexion 2- 5 4+  Hip extension 3+ 5   Hip abduction 3+ 5   Hip adduction     Hip internal rotation     Hip external rotation     Knee flexion  5 4+  Knee extension  5 4  Ankle dorsiflexion 4 5 5   Ankle plantarflexion 4 5   Ankle inversion     Ankle eversion      (Blank rows = not tested)    FUNCTIONAL TESTS:  2 minute walk test: 216 ft done with knee brace and B axillary crutches  GAIT: Distance walked: 216 ft Assistive device utilized: Crutches Level of assistance: Complete Independence Comments: Modified 3-point pattern   TODAY'S TREATMENT:                                                                                                                               DATE:  11/23/2022 Progress note FOTO 58 5 times sit to stand 10.82 sec no UE assist 2  MWT 445 ft no AD Right knee AROM 0-122 MMT's see above  Step ups 6" box 2 x 10 no 1 assist Step downs 4" box x 20 Lateral step downs 4" box x 20 Leg press 3 plates 2 x 10 both legs; right leg 2 plates 2 x 10     11/19/22 Seat 9 bike x 5' level 3 dynamic warm up  Instruction/ taping the knee for swelling/ kinesiotape Standing: Heel/toe raises on incline x 20 Squats x 20 8" box step ups 2 x 10 Bodycraft TKE 3 plates x 20 with 1 UE assist Walkouts 3 plates retro x 10    11/17/22 Seat 9 bike x 5' level 2 dynamic warm up  Standing: Heel/toe raises 2 x 10 Squats 2 x 10 6" step ups 2 x 10 Bodycraft TKE's 2 plates x 20 Walkouts 2 plates retro x 10  Trial of kineotape for swelling right knee      11/12/22 Rocker board x 2 minutes RT/LT Heel raise x 15 Functional squat x 10  Single leg stance x 5  Step up 6" x 15 Step down 2" x 15  Wall sits x 10 hold for 5"  Stool scoot  x 2 RT  Slant board stretch 30" x 3  Walking backward x 2 RT  Prone knee flexion 3# x 10, 5# x 10  SLR with 3# x 10                                  11/10/22 Bike seat 11 x 5' for right knee mobility  Supine: SLR x 10 AROM right knee 0- 120  Standing: 4" step ups 2 x 10 4" lateral step ups 2 x 10 Heel raises/calf stretch on 8" step with emphasis on right 2 x 10 Rocker board F/B and S/S x 1' each Leg press bodycraft 3 plates 2 legs; 2 plates right leg only x 10 each Hamstring curls 3 plates both concentric; right only eccentric x 15   11/04/22 Bike seat 11 x 5' for right knee mobility  Standing: Wall slides 10" hold x 5 at 45 degree angle Heel/toe raises x 20 Slant board 5 x 20" 4" Lateral step ups 2 x 10 Lunge onto 4" step x 10 4" anterior step ups 2 x 10  Stool scoots x 100 ft x 2  TM 1.5 gait training x  4'    11/02/22 Bike seat 11 x 5' for right knee mobility  Slant board 5 x 20" Squats x 10 Knee drives for flexion x 2' on 12" step x 2' Hamstring stretch on 12" step x 2' Squats 2 x 10  4" step ups x 10 with 1 HHA 8" step ups x 5 with 1 HHA 4" step navigation ascend and descend with 1 HHA    10/28/22 Progress note  Bike seat 11 x 5' for right knee mobility FOTO 51 AROM  2 MWT no AD; wearing brace 388 ft  Standing: Heel/toe raises x 20 Slant board 5 x 20" Mini squat x 10 TKE GTB x 10  Supine: AROM right knee 0-115    PATIENT EDUCATION:  Education details: Educated on the goals and course of rehab. Written HEP provided and reviewed Person educated: Patient Education method: Explanation, Demonstration, and Handouts Education comprehension: verbalized understanding and returned demonstration  HOME EXERCISE PROGRAM: Access Code: [email or web address] URL: https- Mini Squat with Counter Support  -  1 x daily - 7 x weekly - 1 sets - 10 reps - 5" hold - Single Leg Stance  - 1 x daily - 7 x weekly - 1 sets - 5 reps - Backward Walking with Counter Support  - 1 x daily - 7 x weekly - 1 sets - 10 [email or web address] 5/24  Date: 09/29/2022 Prepared by: Emeline Gins Exercises - Active Straight Leg Raise with Quad Set  - 2 x daily - 7 x weekly - 3 sets - 10 reps - Sidelying Hip Abduction  - 2 x daily - 7 x weekly - 3 sets - 10 reps - Sidelying Hip Adduction  - 3 x daily - 7 x weekly - 3 sets - 10 reps - Prone Hip Extension  - 3 x daily - 7 x weekly - 3 sets - 10 reps - Prone Knee Flexion AROM  - 3 x daily - 7 x weekly - 3 sets - 10 reps  Date: 09/18/2022 Prepared by: Krystal Clark Exercises - Supine Heel Slide with Strap  - 2 x daily - 7 x weekly - 3 sets - 10 reps - Supine Quad Set  - 2 x daily - 7 x weekly - 3 sets - 10 reps - 6 hold  ASSESSMENT:  CLINICAL IMPRESSION:  Progress note today   Patient met goal for 2 MWT.  Patient with good strength improvement but  continues with some lingering swelling anterior knee that is limited total knee extension strength.  Of note patient able to ambulate in gym without brace; only mild limp noted. Patient has made good progress but is somewhat behind protocol due to some lapse in treatment when patient was out of town for a few weeks.    To date, skilled PT is required to address the impairments and improve function.   OBJECTIVE IMPAIRMENTS: Abnormal gait, decreased activity tolerance, decreased balance, difficulty walking, decreased ROM, decreased strength, impaired flexibility, and pain.   ACTIVITY LIMITATIONS: carrying, lifting, bending, sitting, squatting, sleeping, stairs, transfers, bed mobility, bathing, toileting, dressing, and hygiene/grooming  PARTICIPATION LIMITATIONS: meal prep, cleaning, laundry, driving, shopping, community activity, and occupation  PERSONAL FACTORS: Time since onset of injury/illness/exacerbation are also affecting patient's functional outcome.   REHAB POTENTIAL: Good  CLINICAL DECISION MAKING: Stable/uncomplicated  EVALUATION COMPLEXITY: Low   GOALS: Goals reviewed with patient? Yes  SHORT TERM GOALS: Target date: 10/09/2022 Pt will demonstrate indep in HEP to facilitate carry-over of skilled services and improve functional outcomes Goal status: IN PROGRESS  2.  Patient will demonstrate increase in knee flex ROM to 120 degrees to facilitate ease in ambulation Baseline: 20 degrees Goal status: MET   LONG TERM GOALS: Target date: 10/30/2022  Pt will increase by at least 40 ft without assistive device in order to demonstrate clinically significant improvement in community ambulation Baseline: 216 ft; 11/23/22 445 ft Goal status: MET  2.  Pt will increase FOTO to the predicted pattern in order to demonstrate significant improvement in function related to ambulation  Baseline: 23.08; 58 (goal 65) 11/23/22 Goal status: IN PROGRESS  3.  Pt will demonstrate increase in  LE strength to 5 to facilitate ease and safety in ambulation  Baseline: 2-/5; see above Goal status: IN PROGRESS  4.  Pt will be able to perform SLS on the R to 15 sec (firm surface, eyes open( to facilitate ease and safety and ambulation Baseline: not performed Goal status: IN PROGRESS  PLAN:  PT FREQUENCY: 3x/week  PT DURATION: 6 weeks  PLANNED INTERVENTIONS: Therapeutic exercises, Therapeutic activity, Neuromuscular re-education, Balance training, Gait training, Patient/Family education, Self Care, Stair training, and Manual therapy  PLAN FOR NEXT SESSION: Continue POC and may progress per protocol with emphasis on ROM and strength.   Sees MD on Thursday 6/6  1:41 PM, 11/23/22 Ayo Smoak Small Burk Hoctor MPT [place] [facility] [place] [phone]

## 2022-11-25 ENCOUNTER — Ambulatory Visit (HOSPITAL_COMMUNITY): Payer: Self-pay

## 2022-11-25 ENCOUNTER — Encounter (HOSPITAL_COMMUNITY): Payer: Self-pay

## 2022-11-25 DIAGNOSIS — R29898 Other symptoms and signs involving the musculoskeletal system: Secondary | ICD-10-CM

## 2022-11-25 DIAGNOSIS — R262 Difficulty in walking, not elsewhere classified: Secondary | ICD-10-CM

## 2022-11-25 DIAGNOSIS — M25561 Pain in right knee: Secondary | ICD-10-CM

## 2022-11-25 NOTE — Therapy (Signed)
OUTPATIENT PHYSICAL THERAPY TREATMENT  Patient Name: Kristine Adams MRN: [id number] DOB:December 18, 1985, 37 y.o., female Today's Date: 11/25/2022  END OF SESSION:   PT End of Session - 11/25/22 1411     Visit Number 13    Number of Visits 16    Date for PT Re-Evaluation 11/27/22    Authorization Type 50% [facility] [employer]    PT Start Time 1302    PT Stop Time 1344    PT Time Calculation (min) 42 min    Activity Tolerance Patient tolerated treatment well    Behavior During Therapy WFL for tasks assessed/performed               Past Medical History:  Diagnosis Date   Anemia    Hyperlipidemia    No pertinent past medical history    Past Surgical History:  Procedure Laterality Date   ANTERIOR CRUCIATE LIGAMENT REPAIR Right 09/08/2022   Procedure: ANTERIOR CRUCIATE LIGAMENT (ACL) REPAIR WITH QUADRICEPS AUTOGRAFT;  Surgeon: Vickki Hearing, MD;  Location: [facility];  Service: Orthopedics;  Laterality: Right;   BREAST LUMPECTOMY WITH RADIOACTIVE SEED LOCALIZATION Right 07/23/2020   Procedure: RIGHT BREAST LUMPECTOMYX 2  WITH RADIOACTIVE SEED LOCALIZATION;  Surgeon: Harriette Bouillon, MD;  Location: [facility];  Service: General;  Laterality: Right;   CESAREAN SECTION     CESAREAN SECTION WITH BILATERAL TUBAL LIGATION  07/11/2012   Procedure: CESAREAN SECTION WITH BILATERAL TUBAL LIGATION;  Surgeon: Tilda Burrow, MD;  Location: [facility];  Service: Obstetrics;  Laterality: Bilateral;  speaks spanish, interpreter needed   KNEE ARTHROSCOPY WITH MEDIAL MENISECTOMY Right 09/08/2022   Procedure: KNEE ARTHROSCOPY EXAM UNDER ANESTHESIA;  Surgeon: Vickki Hearing, MD;  Location: [facility];  Service: Orthopedics;  Laterality: Right;   TUBAL LIGATION     Patient Active Problem List   Diagnosis Date Noted   Rupture of anterior cruciate ligament of right knee 09/08/2022   Right shoulder pain 05/12/2022   History of DVT of lower extremity 05/12/2022   Encounter  for gynecological examination with Papanicolaou smear of cervix 05/12/2022   Right knee pain 05/12/2022   Dyspareunia, female 05/12/2022   DVT (deep venous thrombosis) (HCC) 03/16/2022   Itching of vulva 07/28/2021   Screening breast examination 01/24/2019   Left breast mass 01/24/2019   Menorrhagia with regular cycle 09/21/2018    PCP: Reather Converse, PA-C  REFERRING PROVIDER: Vickki Hearing, MD  REFERRING DIAG: [phone] (ICD-10-CM) - S/P ACL reconstruction  THERAPY DIAG:  Right knee pain, unspecified chronicity  Weakness of right lower extremity  Difficulty walking  Rationale for Evaluation and Treatment: Rehabilitation  ONSET DATE: 09/08/2022  SUBJECTIVE:   SUBJECTIVE STATEMENT: Tape helps with swelling; still has most difficulty with steps PERTINENT HISTORY: Dizziness, blood clots PAIN:  Are you having pain? Yes: NPRS scale: 0/10 Pain location: in front of the R knee Pain description: pulsating Aggravating factors: moving the knee, walking Relieving factors: rest  PRECAUTIONS: Other: Can take the brace when laying down. Needs to wear the brace locked in ext for 4 weeks when upright  WEIGHT BEARING RESTRICTIONS: Yes WBAT  FALLS:  Has patient fallen in last 6 months? No  LIVING ENVIRONMENT: Lives with: lives with their family Lives in: Mobile home Stairs: Yes: External: 7 steps; can reach both Has following equipment at home: Crutches  OCCUPATION: in food preparation  PLOF: Independent  PATIENT GOALS: "to walk perfectly so I can get back to work"  NEXT MD VISIT: 11/26/22  OBJECTIVE:   DIAGNOSTIC FINDINGS:  MRI OF THE RIGHT KNEE WITHOUT CONTRAST 03/04/2022   IMPRESSION: 1.  Radial tear of the posterior horn of the lateral meniscus.   2. Thickening/partial-thickness tear of the medial collateral ligament,, likely a chronic process.   2. Edema and thickening of the anterior cruciate ligament without evidence of discrete tear suggesting  chronic ligamentous sprain.   3.  Moderate knee joint effusion.   4.  No evidence of fracture or osteonecrosis.  PATIENT SURVEYS:  FOTO 23.08  COGNITION: Overall cognitive status: Within functional limits for tasks assessed     SENSATION: Not tested  EDEMA:  Slight swelling on R knee. Bandage present without drainage. Knee brace locked in extension  MUSCLE LENGTH: Moderate restriction on B hamstrings Mild restriction on B hamstrings  POSTURE: rounded shoulders and forward head  PALPATION: Grade 1 tenderness on major bony landmarks on the R knee  LOWER EXTREMITY ROM:  Active ROM Right eval Left eval Right 09/29/22 Right 10/28/22 Right 11/10/22 Right 11/23/22  Hip flexion [facility]      Hip extension [facility]      Hip abduction [facility]      Hip adduction        Hip internal rotation        Hip external rotation        Knee flexion 20 WFL 77 115 120 122  Knee extension 0 WFL 0 0 (left knee hyperextends) 0 0  Ankle dorsiflexion [facility]      Ankle plantarflexion [facility] WFL      Ankle inversion        Ankle eversion         (Blank rows = not tested)  LOWER EXTREMITY MMT:  MMT Right eval Left eval Right 11/23/22  Hip flexion 2- 5 4+  Hip extension 3+ 5   Hip abduction 3+ 5   Hip adduction     Hip internal rotation     Hip external rotation     Knee flexion  5 4+  Knee extension  5 4  Ankle dorsiflexion 4 5 5   Ankle plantarflexion 4 5   Ankle inversion     Ankle eversion      (Blank rows = not tested)    FUNCTIONAL TESTS:  2 minute walk test: 216 ft done with knee brace and B axillary crutches  GAIT: Distance walked: 216 ft Assistive device utilized: Crutches Level of assistance: Complete Independence Comments: Modified 3-point pattern   TODAY'S TREATMENT:                                                                                                                              DATE:  11/25/22 Seat 8 bike x 5' level 2 dynamic warm up  Standing:   Squat  to heel raise minimal HHA 20x Step up 6in 20x Step downs 4" box x 20 Lateral step downs 4" box x 20 Lunge onto  4in step no UE A Leg press 3 plates 2 x 10 both legs; right leg 2 plates 2 x 10  kinesiotape for swelling right knee   11/23/2022 Progress note FOTO 58 5 times sit to stand 10.82 sec no UE assist 2 MWT 445 ft no AD Right knee AROM 0-122 MMT's see above  Step ups 6" box 2 x 10 no 1 assist Step downs 4" box x 20 Lateral step downs 4" box x 20 Leg press 3 plates 2 x 10 both legs; right leg 2 plates 2 x 10     11/19/22 Seat 9 bike x 5' level 3 dynamic warm up  Instruction/ taping the knee for swelling/ kinesiotape Standing: Heel/toe raises on incline x 20 Squats x 20 8" box step ups 2 x 10 Bodycraft TKE 3 plates x 20 with 1 UE assist Walkouts 3 plates retro x 10    11/17/22 Seat 9 bike x 5' level 2 dynamic warm up  Standing: Heel/toe raises 2 x 10 Squats 2 x 10 6" step ups 2 x 10 Bodycraft TKE's 2 plates x 20 Walkouts 2 plates retro x 10  Trial of kineotape for swelling right knee      11/12/22 Rocker board x 2 minutes RT/LT Heel raise x 15 Functional squat x 10  Single leg stance x 5  Step up 6" x 15 Step down 2" x 15  Wall sits x 10 hold for 5"  Stool scoot  x 2 RT  Slant board stretch 30" x 3  Walking backward x 2 RT  Prone knee flexion 3# x 10, 5# x 10  SLR with 3# x 10                                  11/10/22 Bike seat 11 x 5' for right knee mobility  Supine: SLR x 10 AROM right knee 0- 120  Standing: 4" step ups 2 x 10 4" lateral step ups 2 x 10 Heel raises/calf stretch on 8" step with emphasis on right 2 x 10 Rocker board F/B and S/S x 1' each Leg press bodycraft 3 plates 2 legs; 2 plates right leg only x 10 each Hamstring curls 3 plates both concentric; right only eccentric x 15   11/04/22 Bike seat 11 x 5' for right knee mobility  Standing: Wall slides 10" hold x 5 at 45 degree angle Heel/toe raises x 20 Slant board 5 x  20" 4" Lateral step ups 2 x 10 Lunge onto 4" step x 10 4" anterior step ups 2 x 10  Stool scoots x 100 ft x 2  TM 1.5 gait training x 4'    11/02/22 Bike seat 11 x 5' for right knee mobility  Slant board 5 x 20" Squats x 10 Knee drives for flexion x 2' on 12" step x 2' Hamstring stretch on 12" step x 2' Squats 2 x 10  4" step ups x 10 with 1 HHA 8" step ups x 5 with 1 HHA 4" step navigation ascend and descend with 1 HHA    10/28/22 Progress note  Bike seat 11 x 5' for right knee mobility FOTO 51 AROM  2 MWT no AD; wearing brace 388 ft  Standing: Heel/toe raises x 20 Slant board 5 x 20" Mini squat x 10 TKE GTB x 10  Supine: AROM right knee 0-115    PATIENT EDUCATION:  Education details:  Educated on the goals and course of rehab. Written HEP provided and reviewed Person educated: Patient Education method: Explanation, Demonstration, and Handouts Education comprehension: verbalized understanding and returned demonstration  HOME EXERCISE PROGRAM: Access Code: [email or web address] URL: https- Mini Squat with Counter Support  - 1 x daily - 7 x weekly - 1 sets - 10 reps - 5" hold - Single Leg Stance  - 1 x daily - 7 x weekly - 1 sets - 5 reps - Backward Walking with Counter Support  - 1 x daily - 7 x weekly - 1 sets - 10 [email or web address] 5/24  Date: 09/29/2022 Prepared by: Emeline Gins Exercises - Active Straight Leg Raise with Quad Set  - 2 x daily - 7 x weekly - 3 sets - 10 reps - Sidelying Hip Abduction  - 2 x daily - 7 x weekly - 3 sets - 10 reps - Sidelying Hip Adduction  - 3 x daily - 7 x weekly - 3 sets - 10 reps - Prone Hip Extension  - 3 x daily - 7 x weekly - 3 sets - 10 reps - Prone Knee Flexion AROM  - 3 x daily - 7 x weekly - 3 sets - 10 reps  Date: 09/18/2022 Prepared by: Krystal Clark Exercises - Supine Heel Slide with Strap  - 2 x daily - 7 x weekly - 3 sets - 10 reps - Supine Quad Set  - 2 x daily - 7 x weekly - 3 sets - 10 reps - 6  hold  ASSESSMENT:  CLINICAL IMPRESSION:   Pt 11 weeks post-op and progressing well though behind protocol due to some lapse in treatment when patient was out of town.  Session focus with quad and functional strengthening, added lunges this session.  Pt presents with weak eccentric control with step down training.  No reports of pain through session.  Pt with MD apt tomorrow.    OBJECTIVE IMPAIRMENTS: Abnormal gait, decreased activity tolerance, decreased balance, difficulty walking, decreased ROM, decreased strength, impaired flexibility, and pain.   ACTIVITY LIMITATIONS: carrying, lifting, bending, sitting, squatting, sleeping, stairs, transfers, bed mobility, bathing, toileting, dressing, and hygiene/grooming  PARTICIPATION LIMITATIONS: meal prep, cleaning, laundry, driving, shopping, community activity, and occupation  PERSONAL FACTORS: Time since onset of injury/illness/exacerbation are also affecting patient's functional outcome.   REHAB POTENTIAL: Good  CLINICAL DECISION MAKING: Stable/uncomplicated  EVALUATION COMPLEXITY: Low   GOALS: Goals reviewed with patient? Yes  SHORT TERM GOALS: Target date: 10/09/2022 Pt will demonstrate indep in HEP to facilitate carry-over of skilled services and improve functional outcomes Goal status: IN PROGRESS  2.  Patient will demonstrate increase in knee flex ROM to 120 degrees to facilitate ease in ambulation Baseline: 20 degrees Goal status: MET   LONG TERM GOALS: Target date: 10/30/2022  Pt will increase by at least 40 ft without assistive device in order to demonstrate clinically significant improvement in community ambulation Baseline: 216 ft; 11/23/22 445 ft Goal status: MET  2.  Pt will increase FOTO to the predicted pattern in order to demonstrate significant improvement in function related to ambulation  Baseline: 23.08; 58 (goal 65) 11/23/22 Goal status: IN PROGRESS  3.  Pt will demonstrate increase in LE strength to 5 to  facilitate ease and safety in ambulation  Baseline: 2-/5; see above Goal status: IN PROGRESS  4.  Pt will be able to perform SLS on the R to 15 sec (firm surface, eyes open( to facilitate ease and safety and ambulation Baseline: not  performed Goal status: IN PROGRESS  PLAN:  PT FREQUENCY: 3x/week  PT DURATION: 6 weeks  PLANNED INTERVENTIONS: Therapeutic exercises, Therapeutic activity, Neuromuscular re-education, Balance training, Gait training, Patient/Family education, Self Care, Stair training, and Manual therapy  PLAN FOR NEXT SESSION: Continue POC and may progress per protocol with emphasis on ROM and strength.   Sees MD on Thursday 6/6  Becky Sax, LPTA/CLT; CBIS [phone]  2:49 PM, 11/25/22

## 2022-11-26 ENCOUNTER — Ambulatory Visit (INDEPENDENT_AMBULATORY_CARE_PROVIDER_SITE_OTHER): Payer: Self-pay | Admitting: Orthopedic Surgery

## 2022-11-26 ENCOUNTER — Telehealth: Payer: Self-pay | Admitting: Orthopedic Surgery

## 2022-11-26 ENCOUNTER — Encounter: Payer: Self-pay | Admitting: Orthopedic Surgery

## 2022-11-26 VITALS — BP 111/74 | HR 87 | Ht 61.0 in | Wt 178.0 lb

## 2022-11-26 DIAGNOSIS — Z9889 Other specified postprocedural states: Secondary | ICD-10-CM

## 2022-11-26 NOTE — Progress Notes (Signed)
Chief Complaint  Patient presents with   Routine Post Op    2nd post of of right knee   09/08/22 Encounter Diagnosis  Name Primary?   S/P ACL reconstruction  QT 09/08/22 right Yes   C/o trouble with steps up and down esp down  Exam: right knee  Lachman solid   ROM = 0-118  Brace: can stop   Continue PT   FU 6 weeks

## 2022-11-26 NOTE — Telephone Encounter (Signed)
Ok to stop left message to advise

## 2022-11-26 NOTE — Telephone Encounter (Signed)
Patient wants to know how much longer she needs to wear the knee brace

## 2022-11-30 ENCOUNTER — Encounter (HOSPITAL_COMMUNITY): Payer: Self-pay

## 2022-11-30 ENCOUNTER — Ambulatory Visit (HOSPITAL_COMMUNITY): Payer: Self-pay

## 2022-11-30 DIAGNOSIS — M25561 Pain in right knee: Secondary | ICD-10-CM

## 2022-11-30 DIAGNOSIS — R262 Difficulty in walking, not elsewhere classified: Secondary | ICD-10-CM

## 2022-11-30 DIAGNOSIS — R29898 Other symptoms and signs involving the musculoskeletal system: Secondary | ICD-10-CM

## 2022-11-30 NOTE — Therapy (Signed)
OUTPATIENT PHYSICAL THERAPY TREATMENT  Patient Name: Kristine Adams MRN: [id number] DOB:September 20, 1985, 37 y.o., female Today's Date: 11/30/2022  END OF SESSION:   PT End of Session - 11/30/22 0903     Visit Number 14    Number of Visits 16    Date for PT Re-Evaluation 12/25/22    Authorization Type 50% [facility] [employer]    PT Start Time [phone]    PT Stop Time 0903    PT Time Calculation (min) 40 min    Activity Tolerance Patient tolerated treatment well    Behavior During Therapy [facility] for tasks assessed/performed                Past Medical History:  Diagnosis Date   Anemia    Hyperlipidemia    No pertinent past medical history    Past Surgical History:  Procedure Laterality Date   ANTERIOR CRUCIATE LIGAMENT REPAIR Right 09/08/2022   Procedure: ANTERIOR CRUCIATE LIGAMENT (ACL) REPAIR WITH QUADRICEPS AUTOGRAFT;  Surgeon: Vickki Hearing, MD;  Location: [facility];  Service: Orthopedics;  Laterality: Right;   BREAST LUMPECTOMY WITH RADIOACTIVE SEED LOCALIZATION Right 07/23/2020   Procedure: RIGHT BREAST LUMPECTOMYX 2  WITH RADIOACTIVE SEED LOCALIZATION;  Surgeon: Harriette Bouillon, MD;  Location: [facility];  Service: General;  Laterality: Right;   CESAREAN SECTION     CESAREAN SECTION WITH BILATERAL TUBAL LIGATION  07/11/2012   Procedure: CESAREAN SECTION WITH BILATERAL TUBAL LIGATION;  Surgeon: Tilda Burrow, MD;  Location: [facility];  Service: Obstetrics;  Laterality: Bilateral;  speaks spanish, interpreter needed   KNEE ARTHROSCOPY WITH MEDIAL MENISECTOMY Right 09/08/2022   Procedure: KNEE ARTHROSCOPY EXAM UNDER ANESTHESIA;  Surgeon: Vickki Hearing, MD;  Location: [facility];  Service: Orthopedics;  Laterality: Right;   TUBAL LIGATION     Patient Active Problem List   Diagnosis Date Noted   Rupture of anterior cruciate ligament of right knee 09/08/2022   Right shoulder pain 05/12/2022   History of DVT of lower extremity 05/12/2022   Encounter  for gynecological examination with Papanicolaou smear of cervix 05/12/2022   Right knee pain 05/12/2022   Dyspareunia, female 05/12/2022   DVT (deep venous thrombosis) (HCC) 03/16/2022   Itching of vulva 07/28/2021   Screening breast examination 01/24/2019   Left breast mass 01/24/2019   Menorrhagia with regular cycle 09/21/2018    PCP: Reather Converse, PA-C  REFERRING PROVIDER: Vickki Hearing, MD  REFERRING DIAG: [phone] (ICD-10-CM) - S/P ACL reconstruction  THERAPY DIAG:  Right knee pain, unspecified chronicity  Weakness of right lower extremity  Difficulty walking  Rationale for Evaluation and Treatment: Rehabilitation  ONSET DATE: 09/08/2022  SUBJECTIVE:   SUBJECTIVE STATEMENT: Continues to c/o swelling while standing/walking.  Still has difficulty with stairs.    PERTINENT HISTORY: Dizziness, blood clots PAIN:  Are you having pain? Yes: NPRS scale: 0/10 Pain location: in front of the R knee Pain description: pulsating Aggravating factors: moving the knee, walking Relieving factors: rest  PRECAUTIONS: Other: Can take the brace when laying down. Needs to wear the brace locked in ext for 4 weeks when upright  WEIGHT BEARING RESTRICTIONS: Yes WBAT  FALLS:  Has patient fallen in last 6 months? No  LIVING ENVIRONMENT: Lives with: lives with their family Lives in: Mobile home Stairs: Yes: External: 7 steps; can reach both Has following equipment at home: Crutches  OCCUPATION: in food preparation  PLOF: Independent  PATIENT GOALS: "to walk perfectly so I can get back to work"  NEXT MD VISIT: 11/26/22  OBJECTIVE:   DIAGNOSTIC FINDINGS:  MRI OF THE RIGHT KNEE WITHOUT CONTRAST 03/04/2022   IMPRESSION: 1.  Radial tear of the posterior horn of the lateral meniscus.   2. Thickening/partial-thickness tear of the medial collateral ligament,, likely a chronic process.   2. Edema and thickening of the anterior cruciate ligament without evidence of  discrete tear suggesting chronic ligamentous sprain.   3.  Moderate knee joint effusion.   4.  No evidence of fracture or osteonecrosis.  PATIENT SURVEYS:  FOTO 23.08  COGNITION: Overall cognitive status: Within functional limits for tasks assessed     SENSATION: Not tested  EDEMA:  Slight swelling on R knee. Bandage present without drainage. Knee brace locked in extension  MUSCLE LENGTH: Moderate restriction on B hamstrings Mild restriction on B hamstrings  POSTURE: rounded shoulders and forward head  PALPATION: Grade 1 tenderness on major bony landmarks on the R knee  LOWER EXTREMITY ROM:  Active ROM Right eval Left eval Right 09/29/22 Right 10/28/22 Right 11/10/22 Right 11/23/22  Hip flexion [facility]      Hip extension [facility]      Hip abduction [facility]      Hip adduction        Hip internal rotation        Hip external rotation        Knee flexion 20 WFL 77 115 120 122  Knee extension 0 WFL 0 0 (left knee hyperextends) 0 0  Ankle dorsiflexion [facility]      Ankle plantarflexion [facility] WFL      Ankle inversion        Ankle eversion         (Blank rows = not tested)  LOWER EXTREMITY MMT:  MMT Right eval Left eval Right 11/23/22  Hip flexion 2- 5 4+  Hip extension 3+ 5   Hip abduction 3+ 5   Hip adduction     Hip internal rotation     Hip external rotation     Knee flexion  5 4+  Knee extension  5 4  Ankle dorsiflexion 4 5 5   Ankle plantarflexion 4 5   Ankle inversion     Ankle eversion      (Blank rows = not tested)    FUNCTIONAL TESTS:  2 minute walk test: 216 ft done with knee brace and B axillary crutches  GAIT: Distance walked: 216 ft Assistive device utilized: Crutches Level of assistance: Complete Independence Comments: Modified 3-point pattern   TODAY'S TREATMENT:                                                                                                                              DATE:  11/30/22 Seat 8 bike x 5' level 2 dynamic warm  up Educated benefits with compression garments with measurements taken and given printout   Standing:  Squat to heel raise minimal HHA 20x Vector stance 5x  5" holds Step up 6in 20x Step downs 4" box x 20 Lateral step downs 4" box x 20 Lunge onto 2in step no UE A    11/25/22 Seat 8 bike x 5' level 2 dynamic warm up  Standing:   Squat to heel raise minimal HHA 20x Step up 6in 20x Step downs 4" box x 20 Lateral step downs 4" box x 20 Lunge onto 4in step no UE A Leg press 3 plates 2 x 10 both legs; right leg 2 plates 2 x 10  kinesiotape for swelling right knee   11/23/2022 Progress note FOTO 58 5 times sit to stand 10.82 sec no UE assist 2 MWT 445 ft no AD Right knee AROM 0-122 MMT's see above  Step ups 6" box 2 x 10 no 1 assist Step downs 4" box x 20 Lateral step downs 4" box x 20 Leg press 3 plates 2 x 10 both legs; right leg 2 plates 2 x 10     11/19/22 Seat 9 bike x 5' level 3 dynamic warm up  Instruction/ taping the knee for swelling/ kinesiotape Standing: Heel/toe raises on incline x 20 Squats x 20 8" box step ups 2 x 10 Bodycraft TKE 3 plates x 20 with 1 UE assist Walkouts 3 plates retro x 10    11/17/22 Seat 9 bike x 5' level 2 dynamic warm up  Standing: Heel/toe raises 2 x 10 Squats 2 x 10 6" step ups 2 x 10 Bodycraft TKE's 2 plates x 20 Walkouts 2 plates retro x 10  Trial of kineotape for swelling right knee      11/12/22 Rocker board x 2 minutes RT/LT Heel raise x 15 Functional squat x 10  Single leg stance x 5  Step up 6" x 15 Step down 2" x 15  Wall sits x 10 hold for 5"  Stool scoot  x 2 RT  Slant board stretch 30" x 3  Walking backward x 2 RT  Prone knee flexion 3# x 10, 5# x 10  SLR with 3# x 10                                  11/10/22 Bike seat 11 x 5' for right knee mobility  Supine: SLR x 10 AROM right knee 0- 120  Standing: 4" step ups 2 x 10 4" lateral step ups 2 x 10 Heel raises/calf stretch on 8" step with  emphasis on right 2 x 10 Rocker board F/B and S/S x 1' each Leg press bodycraft 3 plates 2 legs; 2 plates right leg only x 10 each Hamstring curls 3 plates both concentric; right only eccentric x 15   11/04/22 Bike seat 11 x 5' for right knee mobility  Standing: Wall slides 10" hold x 5 at 45 degree angle Heel/toe raises x 20 Slant board 5 x 20" 4" Lateral step ups 2 x 10 Lunge onto 4" step x 10 4" anterior step ups 2 x 10  Stool scoots x 100 ft x 2  TM 1.5 gait training x 4'    11/02/22 Bike seat 11 x 5' for right knee mobility  Slant board 5 x 20" Squats x 10 Knee drives for flexion x 2' on 12" step x 2' Hamstring stretch on 12" step x 2' Squats 2 x 10  4" step ups x 10 with 1 HHA 8" step ups x 5 with 1 HHA  4" step navigation ascend and descend with 1 HHA    10/28/22 Progress note  Bike seat 11 x 5' for right knee mobility FOTO 51 AROM  2 MWT no AD; wearing brace 388 ft  Standing: Heel/toe raises x 20 Slant board 5 x 20" Mini squat x 10 TKE GTB x 10  Supine: AROM right knee 0-115    PATIENT EDUCATION:  Education details: Educated on the goals and course of rehab. Written HEP provided and reviewed Person educated: Patient Education method: Explanation, Demonstration, and Handouts Education comprehension: verbalized understanding and returned demonstration  HOME EXERCISE PROGRAM: Access Code: [email or web address] URL: https- Mini Squat with Counter Support  - 1 x daily - 7 x weekly - 1 sets - 10 reps - 5" hold - Single Leg Stance  - 1 x daily - 7 x weekly - 1 sets - 5 reps - Backward Walking with Counter Support  - 1 x daily - 7 x weekly - 1 sets - 10 [email or web address] 5/24  Date: 09/29/2022 Prepared by: Emeline Gins Exercises - Active Straight Leg Raise with Quad Set  - 2 x daily - 7 x weekly - 3 sets - 10 reps - Sidelying Hip Abduction  - 2 x daily - 7 x weekly - 3 sets - 10 reps - Sidelying Hip Adduction  - 3 x daily - 7 x weekly - 3 sets -  10 reps - Prone Hip Extension  - 3 x daily - 7 x weekly - 3 sets - 10 reps - Prone Knee Flexion AROM  - 3 x daily - 7 x weekly - 3 sets - 10 reps  Date: 09/18/2022 Prepared by: Krystal Clark Exercises - Supine Heel Slide with Strap  - 2 x daily - 7 x weekly - 3 sets - 10 reps - Supine Quad Set  - 2 x daily - 7 x weekly - 3 sets - 10 reps - 6 hold  ASSESSMENT:  CLINICAL IMPRESSION:   Session focus with functional strengthening per ACL protocol.  Pt reports main difficulty with edema and stairs.  Pt educated on benefits of compression garments for edema control, measurements complete and handout given for ETI.    Added vector stance for hip stability and continued step up training for eccentric control strengthening.  Pt continues to present with weak eccentric control noted with visible muscle fatigue, attempted increased height though reports of increased pain and unable to control.    OBJECTIVE IMPAIRMENTS: Abnormal gait, decreased activity tolerance, decreased balance, difficulty walking, decreased ROM, decreased strength, impaired flexibility, and pain.   ACTIVITY LIMITATIONS: carrying, lifting, bending, sitting, squatting, sleeping, stairs, transfers, bed mobility, bathing, toileting, dressing, and hygiene/grooming  PARTICIPATION LIMITATIONS: meal prep, cleaning, laundry, driving, shopping, community activity, and occupation  PERSONAL FACTORS: Time since onset of injury/illness/exacerbation are also affecting patient's functional outcome.   REHAB POTENTIAL: Good  CLINICAL DECISION MAKING: Stable/uncomplicated  EVALUATION COMPLEXITY: Low   GOALS: Goals reviewed with patient? Yes  SHORT TERM GOALS: Target date: 10/09/2022 Pt will demonstrate indep in HEP to facilitate carry-over of skilled services and improve functional outcomes Goal status: IN PROGRESS  2.  Patient will demonstrate increase in knee flex ROM to 120 degrees to facilitate ease in ambulation Baseline: 20  degrees Goal status: MET   LONG TERM GOALS: Target date: 10/30/2022  Pt will increase by at least 40 ft without assistive device in order to demonstrate clinically significant improvement in community ambulation Baseline: 216 ft; 11/23/22 445 ft Goal status:  MET  2.  Pt will increase FOTO to the predicted pattern in order to demonstrate significant improvement in function related to ambulation  Baseline: 23.08; 58 (goal 65) 11/23/22 Goal status: IN PROGRESS  3.  Pt will demonstrate increase in LE strength to 5 to facilitate ease and safety in ambulation  Baseline: 2-/5; see above Goal status: IN PROGRESS  4.  Pt will be able to perform SLS on the R to 15 sec (firm surface, eyes open( to facilitate ease and safety and ambulation Baseline: not performed Goal status: IN PROGRESS  PLAN:  PT FREQUENCY: 3x/week  PT DURATION: 6 weeks  PLANNED INTERVENTIONS: Therapeutic exercises, Therapeutic activity, Neuromuscular re-education, Balance training, Gait training, Patient/Family education, Self Care, Stair training, and Manual therapy  PLAN FOR NEXT SESSION: Continue POC and may progress per protocol with emphasis on ROM and strength.     Becky Sax, LPTA/CLT; CBIS [phone]  9:30 AM, 11/30/22

## 2022-11-30 NOTE — Addendum Note (Signed)
Addended byWilhemena Durie S on: 11/30/2022 07:57 AM   Modules accepted: Orders

## 2022-12-03 ENCOUNTER — Ambulatory Visit (HOSPITAL_COMMUNITY): Payer: Self-pay | Admitting: Physical Therapy

## 2022-12-03 DIAGNOSIS — R29898 Other symptoms and signs involving the musculoskeletal system: Secondary | ICD-10-CM

## 2022-12-03 DIAGNOSIS — M25561 Pain in right knee: Secondary | ICD-10-CM

## 2022-12-03 DIAGNOSIS — R262 Difficulty in walking, not elsewhere classified: Secondary | ICD-10-CM

## 2022-12-03 NOTE — Therapy (Signed)
OUTPATIENT PHYSICAL THERAPY TREATMENT  Patient Name: Kristine Adams MRN: [id number] DOB:1985/09/20, 37 y.o., female Today's Date: 12/03/2022  END OF SESSION:   PT End of Session - 12/03/22 1608     Visit Number 15    Number of Visits 16    Date for PT Re-Evaluation 12/25/22    Authorization Type 50% [facility] [employer]    PT Start Time 1308    PT Stop Time 1350    PT Time Calculation (min) 42 min    Activity Tolerance Patient tolerated treatment well    Behavior During Therapy WFL for tasks assessed/performed                Past Medical History:  Diagnosis Date   Anemia    Hyperlipidemia    No pertinent past medical history    Past Surgical History:  Procedure Laterality Date   ANTERIOR CRUCIATE LIGAMENT REPAIR Right 09/08/2022   Procedure: ANTERIOR CRUCIATE LIGAMENT (ACL) REPAIR WITH QUADRICEPS AUTOGRAFT;  Surgeon: Vickki Hearing, MD;  Location: [facility];  Service: Orthopedics;  Laterality: Right;   BREAST LUMPECTOMY WITH RADIOACTIVE SEED LOCALIZATION Right 07/23/2020   Procedure: RIGHT BREAST LUMPECTOMYX 2  WITH RADIOACTIVE SEED LOCALIZATION;  Surgeon: Harriette Bouillon, MD;  Location: [facility];  Service: General;  Laterality: Right;   CESAREAN SECTION     CESAREAN SECTION WITH BILATERAL TUBAL LIGATION  07/11/2012   Procedure: CESAREAN SECTION WITH BILATERAL TUBAL LIGATION;  Surgeon: Tilda Burrow, MD;  Location: [facility];  Service: Obstetrics;  Laterality: Bilateral;  speaks spanish, interpreter needed   KNEE ARTHROSCOPY WITH MEDIAL MENISECTOMY Right 09/08/2022   Procedure: KNEE ARTHROSCOPY EXAM UNDER ANESTHESIA;  Surgeon: Vickki Hearing, MD;  Location: [facility];  Service: Orthopedics;  Laterality: Right;   TUBAL LIGATION     Patient Active Problem List   Diagnosis Date Noted   Rupture of anterior cruciate ligament of right knee 09/08/2022   Right shoulder pain 05/12/2022   History of DVT of lower extremity 05/12/2022   Encounter  for gynecological examination with Papanicolaou smear of cervix 05/12/2022   Right knee pain 05/12/2022   Dyspareunia, female 05/12/2022   DVT (deep venous thrombosis) (HCC) 03/16/2022   Itching of vulva 07/28/2021   Screening breast examination 01/24/2019   Left breast mass 01/24/2019   Menorrhagia with regular cycle 09/21/2018    PCP: Reather Converse, PA-C  REFERRING PROVIDER: Vickki Hearing, MD  REFERRING DIAG: [phone] (ICD-10-CM) - S/P ACL reconstruction  THERAPY DIAG:  Difficulty walking  Weakness of right lower extremity  Right knee pain, unspecified chronicity  Rationale for Evaluation and Treatment: Rehabilitation  ONSET DATE: 09/08/2022  SUBJECTIVE:   SUBJECTIVE STATEMENT: Pt comes today with interpreter and thigh high compression stockings. No pain today just stiffness.  Reports she has to continue to wear brace for 6 more weeks.  PERTINENT HISTORY: Dizziness, blood clots PAIN:  Are you having pain? Yes: NPRS scale: 0/10 Pain location: in front of the R knee Pain description: pulsating Aggravating factors: moving the knee, walking Relieving factors: rest  PRECAUTIONS: Other: Can take the brace when laying down. Needs to wear the brace locked in ext for 4 weeks when upright  WEIGHT BEARING RESTRICTIONS: Yes WBAT  FALLS:  Has patient fallen in last 6 months? No  LIVING ENVIRONMENT: Lives with: lives with their family Lives in: Mobile home Stairs: Yes: External: 7 steps; can reach both Has following equipment at home: Crutches  OCCUPATION: in food preparation  PLOF:  Independent  PATIENT GOALS: "to walk perfectly so I can get back to work"  NEXT MD VISIT: 11/26/22  OBJECTIVE:   DIAGNOSTIC FINDINGS:  MRI OF THE RIGHT KNEE WITHOUT CONTRAST 03/04/2022   IMPRESSION: 1.  Radial tear of the posterior horn of the lateral meniscus.   2. Thickening/partial-thickness tear of the medial collateral ligament,, likely a chronic process.   2. Edema  and thickening of the anterior cruciate ligament without evidence of discrete tear suggesting chronic ligamentous sprain.   3.  Moderate knee joint effusion.   4.  No evidence of fracture or osteonecrosis.  PATIENT SURVEYS:  FOTO 23.08  COGNITION: Overall cognitive status: Within functional limits for tasks assessed     SENSATION: Not tested  EDEMA:  Slight swelling on R knee. Bandage present without drainage. Knee brace locked in extension  MUSCLE LENGTH: Moderate restriction on B hamstrings Mild restriction on B hamstrings  POSTURE: rounded shoulders and forward head  PALPATION: Grade 1 tenderness on major bony landmarks on the R knee  LOWER EXTREMITY ROM:  Active ROM Right eval Left eval Right 09/29/22 Right 10/28/22 Right 11/10/22 Right 11/23/22  Hip flexion [facility]      Hip extension [facility]      Hip abduction [facility]      Hip adduction        Hip internal rotation        Hip external rotation        Knee flexion 20 WFL 77 115 120 122  Knee extension 0 WFL 0 0 (left knee hyperextends) 0 0  Ankle dorsiflexion [facility]      Ankle plantarflexion [facility] WFL      Ankle inversion        Ankle eversion         (Blank rows = not tested)  LOWER EXTREMITY MMT:  MMT Right eval Left eval Right 11/23/22  Hip flexion 2- 5 4+  Hip extension 3+ 5   Hip abduction 3+ 5   Hip adduction     Hip internal rotation     Hip external rotation     Knee flexion  5 4+  Knee extension  5 4  Ankle dorsiflexion 4 5 5   Ankle plantarflexion 4 5   Ankle inversion     Ankle eversion      (Blank rows = not tested)    FUNCTIONAL TESTS:  2 minute walk test: 216 ft done with knee brace and B axillary crutches  GAIT: Distance walked: 216 ft Assistive device utilized: Crutches Level of assistance: Complete Independence Comments: Modified 3-point pattern   TODAY'S TREATMENT:                                                                                                                               DATE:   12/03/22 All exercises completed without brace on Compression garment donning/doffing/education of wear and wash Bike seat 8 level 2 5  minutes Standing: Squat to heel raise min HHA 20X  6" Rt forward step up with power up no UE 20X  4" Rt eccentric lowering Lt heel tap lateral step down 1 HHA 20X  4" Rt eccentric lowering Lt heel tap forward step down 2X10 with 1 HHA  4" Rt lead forward lunges no UE assist 2X10  Rt vector stance with Lt 5" holds, 1 HHA  11/30/22 Seat 8 bike x 5' level 2 dynamic warm up Educated benefits with compression garments with measurements taken and given printout   Standing:  Squat to heel raise minimal HHA 20x Vector stance 5x 5" holds Step up 6in 20x Step downs 4" box x 20 Lateral step downs 4" box x 20 Lunge onto 2in step no UE A    11/25/22 Seat 8 bike x 5' level 2 dynamic warm up  Standing:   Squat to heel raise minimal HHA 20x Step up 6in 20x Step downs 4" box x 20 Lateral step downs 4" box x 20 Lunge onto 4in step no UE A Leg press 3 plates 2 x 10 both legs; right leg 2 plates 2 x 10  kinesiotape for swelling right knee   11/23/2022 Progress note FOTO 58 5 times sit to stand 10.82 sec no UE assist 2 MWT 445 ft no AD Right knee AROM 0-122 MMT's see above  Step ups 6" box 2 x 10 no 1 assist Step downs 4" box x 20 Lateral step downs 4" box x 20 Leg press 3 plates 2 x 10 both legs; right leg 2 plates 2 x 10     11/19/22 Seat 9 bike x 5' level 3 dynamic warm up  Instruction/ taping the knee for swelling/ kinesiotape Standing: Heel/toe raises on incline x 20 Squats x 20 8" box step ups 2 x 10 Bodycraft TKE 3 plates x 20 with 1 UE assist Walkouts 3 plates retro x 10    11/17/22 Seat 9 bike x 5' level 2 dynamic warm up  Standing: Heel/toe raises 2 x 10 Squats 2 x 10 6" step ups 2 x 10 Bodycraft TKE's 2 plates x 20 Walkouts 2 plates retro x 10  Trial of kineotape for swelling right knee       11/12/22 Rocker board x 2 minutes RT/LT Heel raise x 15 Functional squat x 10  Single leg stance x 5  Step up 6" x 15 Step down 2" x 15  Wall sits x 10 hold for 5"  Stool scoot  x 2 RT  Slant board stretch 30" x 3  Walking backward x 2 RT  Prone knee flexion 3# x 10, 5# x 10  SLR with 3# x 10                                  11/10/22 Bike seat 11 x 5' for right knee mobility  Supine: SLR x 10 AROM right knee 0- 120  Standing: 4" step ups 2 x 10 4" lateral step ups 2 x 10 Heel raises/calf stretch on 8" step with emphasis on right 2 x 10 Rocker board F/B and S/S x 1' each Leg press bodycraft 3 plates 2 legs; 2 plates right leg only x 10 each Hamstring curls 3 plates both concentric; right only eccentric x 15   11/04/22 Bike seat 11 x 5' for right knee mobility  Standing: Wall slides 10" hold x 5 at 45  degree angle Heel/toe raises x 20 Slant board 5 x 20" 4" Lateral step ups 2 x 10 Lunge onto 4" step x 10 4" anterior step ups 2 x 10  Stool scoots x 100 ft x 2  TM 1.5 gait training x 4'    11/02/22 Bike seat 11 x 5' for right knee mobility  Slant board 5 x 20" Squats x 10 Knee drives for flexion x 2' on 12" step x 2' Hamstring stretch on 12" step x 2' Squats 2 x 10  4" step ups x 10 with 1 HHA 8" step ups x 5 with 1 HHA 4" step navigation ascend and descend with 1 HHA    10/28/22 Progress note  Bike seat 11 x 5' for right knee mobility FOTO 51 AROM  2 MWT no AD; wearing brace 388 ft  Standing: Heel/toe raises x 20 Slant board 5 x 20" Mini squat x 10 TKE GTB x 10  Supine: AROM right knee 0-115    PATIENT EDUCATION:  Education details: Educated on the goals and course of rehab. Written HEP provided and reviewed Person educated: Patient Education method: Explanation, Demonstration, and Handouts Education comprehension: verbalized understanding and returned demonstration  HOME EXERCISE PROGRAM: Access Code: [email or web address] URL: https- Mini  Squat with Counter Support  - 1 x daily - 7 x weekly - 1 sets - 10 reps - 5" hold - Single Leg Stance  - 1 x daily - 7 x weekly - 1 sets - 5 reps - Backward Walking with Counter Support  - 1 x daily - 7 x weekly - 1 sets - 10 [email or web address] 5/24  Date: 09/29/2022 Prepared by: Emeline Gins Exercises - Active Straight Leg Raise with Quad Set  - 2 x daily - 7 x weekly - 3 sets - 10 reps - Sidelying Hip Abduction  - 2 x daily - 7 x weekly - 3 sets - 10 reps - Sidelying Hip Adduction  - 3 x daily - 7 x weekly - 3 sets - 10 reps - Prone Hip Extension  - 3 x daily - 7 x weekly - 3 sets - 10 reps - Prone Knee Flexion AROM  - 3 x daily - 7 x weekly - 3 sets - 10 reps  Date: 09/18/2022 Prepared by: Krystal Clark Exercises - Supine Heel Slide with Strap  - 2 x daily - 7 x weekly - 3 sets - 10 reps - Supine Quad Set  - 2 x daily - 7 x weekly - 3 sets - 10 reps - 6 hold  ASSESSMENT:  CLINICAL IMPRESSION:   PT educated on wash/wear of garments and instructions of how to doff/don.  PT able to complete independently with minimal cues needed.  Pt reported overall comfort of garment and worn during session today.   Pt with improved eccentric control with forward and lateral step downs today.  Pt expressed happiness in ability to complete as she was unable last session.  Vector stance completed without UE assist this session.  Overall improving.    OBJECTIVE IMPAIRMENTS: Abnormal gait, decreased activity tolerance, decreased balance, difficulty walking, decreased ROM, decreased strength, impaired flexibility, and pain.   ACTIVITY LIMITATIONS: carrying, lifting, bending, sitting, squatting, sleeping, stairs, transfers, bed mobility, bathing, toileting, dressing, and hygiene/grooming  PARTICIPATION LIMITATIONS: meal prep, cleaning, laundry, driving, shopping, community activity, and occupation  PERSONAL FACTORS: Time since onset of injury/illness/exacerbation are also affecting  patient's functional outcome.   REHAB POTENTIAL: Good  CLINICAL DECISION MAKING: Stable/uncomplicated  EVALUATION COMPLEXITY: Low   GOALS: Goals reviewed with patient? Yes  SHORT TERM GOALS: Target date: 10/09/2022 Pt will demonstrate indep in HEP to facilitate carry-over of skilled services and improve functional outcomes Goal status: IN PROGRESS  2.  Patient will demonstrate increase in knee flex ROM to 120 degrees to facilitate ease in ambulation Baseline: 20 degrees Goal status: MET   LONG TERM GOALS: Target date: 10/30/2022  Pt will increase by at least 40 ft without assistive device in order to demonstrate clinically significant improvement in community ambulation Baseline: 216 ft; 11/23/22 445 ft Goal status: MET  2.  Pt will increase FOTO to the predicted pattern in order to demonstrate significant improvement in function related to ambulation  Baseline: 23.08; 58 (goal 65) 11/23/22 Goal status: IN PROGRESS  3.  Pt will demonstrate increase in LE strength to 5 to facilitate ease and safety in ambulation  Baseline: 2-/5; see above Goal status: IN PROGRESS  4.  Pt will be able to perform SLS on the R to 15 sec (firm surface, eyes open( to facilitate ease and safety and ambulation Baseline: not performed Goal status: IN PROGRESS  PLAN:  PT FREQUENCY: 3x/week  PT DURATION: 6 weeks  PLANNED INTERVENTIONS: Therapeutic exercises, Therapeutic activity, Neuromuscular re-education, Balance training, Gait training, Patient/Family education, Self Care, Stair training, and Manual therapy  PLAN FOR NEXT SESSION: Continue POC and may progress per protocol with emphasis on ROM and strength.     Becky Sax, LPTA/CLT; CBIS [phone]  4:12 PM, 12/03/22

## 2022-12-16 ENCOUNTER — Ambulatory Visit (HOSPITAL_COMMUNITY): Payer: Self-pay | Admitting: Physical Therapy

## 2022-12-16 DIAGNOSIS — M25561 Pain in right knee: Secondary | ICD-10-CM

## 2022-12-16 DIAGNOSIS — R29898 Other symptoms and signs involving the musculoskeletal system: Secondary | ICD-10-CM

## 2022-12-16 DIAGNOSIS — R262 Difficulty in walking, not elsewhere classified: Secondary | ICD-10-CM

## 2022-12-16 NOTE — Therapy (Addendum)
OUTPATIENT PHYSICAL THERAPY TREATMENT Progress Note Reporting Period 11/23/2022 to 12/16/2022  See note below for Objective Data and Assessment of Progress/Goals.      Patient Name: Kristine Adams MRN: [id number] DOB:[birth year], 37 y.o., female Today's Date: 12/16/2022  END OF SESSION:   PT End of Session - 12/16/22 1540     Visit Number 16    Number of Visits 24    Date for PT Re-Evaluation 01/15/23    Authorization Type 50% [facility] [employer]    Progress Note Due on Visit 24    PT Start Time 1435    PT Stop Time 1525    PT Time Calculation (min) 50 min    Activity Tolerance Patient tolerated treatment well    Behavior During Therapy WFL for tasks assessed/performed                Past Medical History:  Diagnosis Date   Anemia    Hyperlipidemia    No pertinent past medical history    Past Surgical History:  Procedure Laterality Date   ANTERIOR CRUCIATE LIGAMENT REPAIR Right 09/08/2022   Procedure: ANTERIOR CRUCIATE LIGAMENT (ACL) REPAIR WITH QUADRICEPS AUTOGRAFT;  Surgeon: Vickki Hearing, MD;  Location: [facility];  Service: Orthopedics;  Laterality: Right;   BREAST LUMPECTOMY WITH RADIOACTIVE SEED LOCALIZATION Right 07/23/2020   Procedure: RIGHT BREAST LUMPECTOMYX 2  WITH RADIOACTIVE SEED LOCALIZATION;  Surgeon: Harriette Bouillon, MD;  Location: [facility];  Service: General;  Laterality: Right;   CESAREAN SECTION     CESAREAN SECTION WITH BILATERAL TUBAL LIGATION  07/11/2012   Procedure: CESAREAN SECTION WITH BILATERAL TUBAL LIGATION;  Surgeon: Tilda Burrow, MD;  Location: [facility];  Service: Obstetrics;  Laterality: Bilateral;  speaks spanish, interpreter needed   KNEE ARTHROSCOPY WITH MEDIAL MENISECTOMY Right 09/08/2022   Procedure: KNEE ARTHROSCOPY EXAM UNDER ANESTHESIA;  Surgeon: Vickki Hearing, MD;  Location: [facility];  Service: Orthopedics;  Laterality: Right;   TUBAL LIGATION     Patient Active Problem List   Diagnosis  Date Noted   Rupture of anterior cruciate ligament of right knee 09/08/2022   Right shoulder pain 05/12/2022   History of DVT of lower extremity 05/12/2022   Encounter for gynecological examination with Papanicolaou smear of cervix 05/12/2022   Right knee pain 05/12/2022   Dyspareunia, female 05/12/2022   DVT (deep venous thrombosis) (HCC) 03/16/2022   Itching of vulva 07/28/2021   Screening breast examination 01/24/2019   Left breast mass 01/24/2019   Menorrhagia with regular cycle 09/21/2018    PCP: Reather Converse, PA-C  REFERRING PROVIDER: Vickki Hearing, MD  REFERRING DIAG: [phone] (ICD-10-CM) - S/P ACL reconstruction  THERAPY DIAG:  Difficulty walking  Weakness of right lower extremity  Right knee pain, unspecified chronicity  Rationale for Evaluation and Treatment: Rehabilitation  ONSET DATE: 09/08/2022  SUBJECTIVE:   SUBJECTIVE STATEMENT: Pt returns today following 2 weeks since last appt (clinic had no openings).  Comes today with [facility] interpreter.  States her biggest issue is coming down steps.  Has to wear until she returns to MD 7/11 (2 more weeks).    PERTINENT HISTORY: Dizziness, blood clots PAIN:  Are you having pain? Yes: NPRS scale: 0/10 Pain location: in front of the R knee Pain description: pulsating Aggravating factors: moving the knee, walking Relieving factors: rest  PRECAUTIONS: Other: Can take the brace when laying down. Needs to wear the brace locked in ext for 4 weeks when upright  WEIGHT BEARING RESTRICTIONS:  Yes WBAT  FALLS:  Has patient fallen in last 6 months? No  LIVING ENVIRONMENT: Lives with: lives with their family Lives in: Mobile home Stairs: Yes: External: 7 steps; can reach both Has following equipment at home: Crutches  OCCUPATION: in food preparation  PLOF: Independent  PATIENT GOALS: "to walk perfectly so I can get back to work"  NEXT MD VISIT: 12/31/22  OBJECTIVE:   DIAGNOSTIC FINDINGS:  MRI OF  THE RIGHT KNEE WITHOUT CONTRAST 03/04/2022   IMPRESSION: 1.  Radial tear of the posterior horn of the lateral meniscus.   2. Thickening/partial-thickness tear of the medial collateral ligament,, likely a chronic process.   2. Edema and thickening of the anterior cruciate ligament without evidence of discrete tear suggesting chronic ligamentous sprain.   3.  Moderate knee joint effusion.   4.  No evidence of fracture or osteonecrosis.  PATIENT SURVEYS:  FOTO  6/26: 67%,  6/3: 58%;   evaluation: 23%  COGNITION: Overall cognitive status: Within functional limits for tasks assessed     SENSATION: Not tested  EDEMA:  Slight swelling on R knee. Bandage present without drainage. Knee brace locked in extension  MUSCLE LENGTH: Moderate restriction on B hamstrings Mild restriction on B hamstrings  POSTURE: rounded shoulders and forward head  PALPATION: Grade 1 tenderness on major bony landmarks on the R knee  LOWER EXTREMITY ROM:  Active ROM Right eval Left eval Right 09/29/22 Right 10/28/22 Right 11/10/22 Right 11/23/22  Hip flexion [facility]      Hip extension [facility]      Hip abduction [facility]      Hip adduction        Hip internal rotation        Hip external rotation        Knee flexion 20 WFL 77 115 120 122  Knee extension 0 WFL 0 0 (left knee hyperextends) 0 0  Ankle dorsiflexion [facility]      Ankle plantarflexion [facility] WFL      Ankle inversion        Ankle eversion         (Blank rows = not tested)  LOWER EXTREMITY MMT:  MMT Right eval Left eval Right 11/23/22 Right 12/16/22  Hip flexion 2- 5 4+ 5  Hip extension 3+ 5    Hip abduction 3+ 5    Hip adduction      Hip internal rotation      Hip external rotation      Knee flexion  5 4+ 5  Knee extension  5 4 5   Ankle dorsiflexion 4 5 5    Ankle plantarflexion 4 5    Ankle inversion      Ankle eversion       (Blank rows = not tested)    FUNCTIONAL TESTS:  Evaluation:  2 minute walk test: 216 ft done with  knee brace and B axillary crutches   11/23/22 FOTO 58 5 times sit to stand 10.82 sec no UE assist 2 MWT 445 ft no AD Right knee AROM 0-122 MMT's see above  12/16/22 FOTO 67% (was 58% at last PN) 5 times sit to stand  5.31sec no UE (was 10.82 sec no UE assist) 2 MWT (goal met at last visit, NT) MMT's see above 1 rep max: hamstrings Rt: 45#  Lt:  55#  (82%)   Quadriceps Rt: 8# Lt: 20# (40%)  GAIT: Distance walked: 216 ft Assistive device utilized: Crutches Level of assistance: Complete Independence Comments:  Modified 3-point pattern   TODAY'S TREATMENT:                                                                                                                              DATE:  12/16/22 Bike seat 8 level 2 5 minutes Progress Note FOTO 67% (was 58% at last PN) 5 times sit to stand  5.31sec no UE (was 10.82 sec no UE assist) 2 MWT (goal met at last visit, NT) MMT's see above 1 rep max: hamstrings Rt: 45#  Lt:  55#  (82%)   Quadriceps Rt: 8# Lt: 20# (40%)  12/03/22 All exercises completed without brace on Compression garment donning/doffing/education of wear and wash Bike seat 8 level 2 5 minutes Standing: Squat to heel raise min HHA 20X  6" Rt forward step up with power up no UE 20X  4" Rt eccentric lowering Lt heel tap lateral step down 1 HHA 20X  4" Rt eccentric lowering Lt heel tap forward step down 2X10 with 1 HHA  4" Rt lead forward lunges no UE assist 2X10  Rt vector stance with Lt 5" holds, 1 HHA  11/30/22 Seat 8 bike x 5' level 2 dynamic warm up Educated benefits with compression garments with measurements taken and given printout  Standing:  Squat to heel raise minimal HHA 20x Vector stance 5x 5" holds Step up 6in 20x Step downs 4" box x 20 Lateral step downs 4" box x 20 Lunge onto 2in step no UE A   11/25/22 Seat 8 bike x 5' level 2 dynamic warm up Standing:  Squat to heel raise minimal HHA 20x Step up 6in 20x Step downs 4" box x 20 Lateral step  downs 4" box x 20 Lunge onto 4in step no UE A Leg press 3 plates 2 x 10 both legs; right leg 2 plates 2 x 10  kinesiotape for swelling right knee   11/23/2022 Progress note FOTO 58 5 times sit to stand 10.82 sec no UE assist 2 MWT 445 ft no AD Right knee AROM 0-122 MMT's see above  Step ups 6" box 2 x 10 no 1 assist Step downs 4" box x 20 Lateral step downs 4" box x 20 Leg press 3 plates 2 x 10 both legs; right leg 2 plates 2 x 10   11/19/22 Seat 9 bike x 5' level 3 dynamic warm up Instruction/ taping the knee for swelling/ kinesiotape Standing: Heel/toe raises on incline x 20 Squats x 20 8" box step ups 2 x 10 [employer] TKE 3 plates x 20 with 1 UE assist Walkouts 3 plates retro x 10  11/17/22 Seat 9 bike x 5' level 2 dynamic warm up  Standing: Heel/toe raises 2 x 10 Squats 2 x 10 6" step ups 2 x 10 [employer] TKE's 2 plates x 20 Walkouts 2 plates retro x 10  Trial of kineotape for swelling right knee    11/12/22 Rocker board x 2 minutes  RT/LT Heel raise x 15 Functional squat x 10  Single leg stance x 5  Step up 6" x 15 Step down 2" x 15  Wall sits x 10 hold for 5"  Stool scoot  x 2 RT  Slant board stretch 30" x 3  Walking backward x 2 RT  Prone knee flexion 3# x 10, 5# x 10  SLR with 3# x 10   PATIENT EDUCATION:  Education details: Educated on the goals and course of rehab. Written HEP provided and reviewed Person educated: Patient Education method: Explanation, Demonstration, and Handouts Education comprehension: verbalized understanding and returned demonstration  HOME EXERCISE PROGRAM: Access Code: [email or web address] URL: https- Mini Squat with Counter Support  - 1 x daily - 7 x weekly - 1 sets - 10 reps - 5" hold - Single Leg Stance  - 1 x daily - 7 x weekly - 1 sets - 5 reps - Backward Walking with Counter Support  - 1 x daily - 7 x weekly - 1 sets - 10 [email or web address] 5/24  Date: 09/29/2022 Prepared by: Emeline Gins Exercises - Active  Straight Leg Raise with Quad Set  - 2 x daily - 7 x weekly - 3 sets - 10 reps - Sidelying Hip Abduction  - 2 x daily - 7 x weekly - 3 sets - 10 reps - Sidelying Hip Adduction  - 3 x daily - 7 x weekly - 3 sets - 10 reps - Prone Hip Extension  - 3 x daily - 7 x weekly - 3 sets - 10 reps - Prone Knee Flexion AROM  - 3 x daily - 7 x weekly - 3 sets - 10 reps  Date: 09/18/2022 Prepared by: Krystal Clark Exercises - Supine Heel Slide with Strap  - 2 x daily - 7 x weekly - 3 sets - 10 reps - Supine Quad Set  - 2 x daily - 7 x weekly - 3 sets - 10 reps - 6 hold  ASSESSMENT:  CLINICAL IMPRESSION:   Progress note completed this session.  Pt is 14 weeks post op ACL repair.  Pt has met all this point with higher level goals made.  One rep max completed for bil LE with Rt being 82% for hamstring and 40% for quad as compared to Lt LE.  Pt continues to have difficulty with descending stairs and continues to wear brace per MD orders with weight bearing activities.  Edema is much improved and continues to wear her compression most of the time. Pt will continue to benefit from 4 more weeks of therapy to equalize bil LE strength and stability.     OBJECTIVE IMPAIRMENTS: Abnormal gait, decreased activity tolerance, decreased balance, difficulty walking, decreased ROM, decreased strength, impaired flexibility, and pain.   ACTIVITY LIMITATIONS: carrying, lifting, bending, sitting, squatting, sleeping, stairs, transfers, bed mobility, bathing, toileting, dressing, and hygiene/grooming  PARTICIPATION LIMITATIONS: meal prep, cleaning, laundry, driving, shopping, community activity, and occupation  PERSONAL FACTORS: Time since onset of injury/illness/exacerbation are also affecting patient's functional outcome.   REHAB POTENTIAL: Good  CLINICAL DECISION MAKING: Stable/uncomplicated  EVALUATION COMPLEXITY: Low   GOALS: Goals reviewed with patient? Yes  SHORT TERM GOALS: Target date: 10/09/2022 Pt will  demonstrate indep in HEP to facilitate carry-over of skilled services and improve functional outcomes Goal status: MET  2.  Patient will demonstrate increase in knee flex ROM to 120 degrees to facilitate ease in ambulation Baseline: 20 degrees Goal status: MET   LONG TERM GOALS: Target date: 10/30/2022  Pt will increase by at least 40 ft without assistive device in order to demonstrate clinically significant improvement in community ambulation Baseline: 216 ft; 11/23/22 445 ft Goal status: MET  2.  Pt will increase FOTO to the predicted pattern in order to demonstrate significant improvement in function related to ambulation  Baseline: 23.08; 58 (goal 65) 11/23/22 Goal status: MET  3.  Pt will demonstrate increase in LE strength to 5 to facilitate ease and safety in ambulation  Baseline: 2-/5; see above Goal status: MET  4.  Pt will be able to perform SLS on the R to 15 sec (firm surface, eyes open( to facilitate ease and safety and ambulation Baseline: not performed Goal status: MET  5.  Patient will increase right leg MMTs to 5/5 without pain to promote return to ambulation community distances with minimal deviation.  Baseline: see above  Goal status: set 7/2 6.  Patient will increase right leg press strength to 80% of the left leg to promote return to full activity; working, family activity  Baseline: see above  Goal status; 40%     PLAN:  PT FREQUENCY: 3x/week  PT DURATION: 6 weeks  PLANNED INTERVENTIONS: Therapeutic exercises, Therapeutic activity, Neuromuscular re-education, Balance training, Gait training, Patient/Family education, Self Care, Stair training, and Manual therapy  PLAN FOR NEXT SESSION: Continue POC with higher level strengthening and stability. Begin elliptical.  Progress eccentric strengthening, single leg strengthening; more high level squats and challenges.  Lurena Nida, PTA/CLT [facility] Ph:  [phone]   3:42 PM, 12/16/22

## 2022-12-22 ENCOUNTER — Ambulatory Visit (HOSPITAL_COMMUNITY): Payer: Self-pay | Attending: Orthopedic Surgery

## 2022-12-22 ENCOUNTER — Encounter (HOSPITAL_COMMUNITY): Payer: Self-pay

## 2022-12-22 DIAGNOSIS — M25561 Pain in right knee: Secondary | ICD-10-CM

## 2022-12-22 DIAGNOSIS — R29898 Other symptoms and signs involving the musculoskeletal system: Secondary | ICD-10-CM

## 2022-12-22 DIAGNOSIS — R262 Difficulty in walking, not elsewhere classified: Secondary | ICD-10-CM

## 2022-12-22 NOTE — Therapy (Signed)
OUTPATIENT PHYSICAL THERAPY TREATMENT   Patient Name: Kristine Adams MRN: [id number] DOB:1985/12/14, 37 y.o., female Today's Date: 12/22/2022  END OF SESSION:   PT End of Session - 12/22/22 1314     Visit Number 17    Number of Visits 24    Date for PT Re-Evaluation 01/15/23    Authorization Type 50% [facility] [employer]    Progress Note Due on Visit 24    PT Start Time 1314   Late for apt   PT Stop Time 1352    PT Time Calculation (min) 38 min    Activity Tolerance Patient tolerated treatment well    Behavior During Therapy WFL for tasks assessed/performed                Past Medical History:  Diagnosis Date   Anemia    Hyperlipidemia    No pertinent past medical history    Past Surgical History:  Procedure Laterality Date   ANTERIOR CRUCIATE LIGAMENT REPAIR Right 09/08/2022   Procedure: ANTERIOR CRUCIATE LIGAMENT (ACL) REPAIR WITH QUADRICEPS AUTOGRAFT;  Surgeon: Vickki Hearing, MD;  Location: [facility];  Service: Orthopedics;  Laterality: Right;   BREAST LUMPECTOMY WITH RADIOACTIVE SEED LOCALIZATION Right 07/23/2020   Procedure: RIGHT BREAST LUMPECTOMYX 2  WITH RADIOACTIVE SEED LOCALIZATION;  Surgeon: Harriette Bouillon, MD;  Location: [facility];  Service: General;  Laterality: Right;   CESAREAN SECTION     CESAREAN SECTION WITH BILATERAL TUBAL LIGATION  07/11/2012   Procedure: CESAREAN SECTION WITH BILATERAL TUBAL LIGATION;  Surgeon: Tilda Burrow, MD;  Location: [facility];  Service: Obstetrics;  Laterality: Bilateral;  speaks spanish, interpreter needed   KNEE ARTHROSCOPY WITH MEDIAL MENISECTOMY Right 09/08/2022   Procedure: KNEE ARTHROSCOPY EXAM UNDER ANESTHESIA;  Surgeon: Vickki Hearing, MD;  Location: [facility];  Service: Orthopedics;  Laterality: Right;   TUBAL LIGATION     Patient Active Problem List   Diagnosis Date Noted   Rupture of anterior cruciate ligament of right knee 09/08/2022   Right shoulder pain 05/12/2022   History  of DVT of lower extremity 05/12/2022   Encounter for gynecological examination with Papanicolaou smear of cervix 05/12/2022   Right knee pain 05/12/2022   Dyspareunia, female 05/12/2022   DVT (deep venous thrombosis) (HCC) 03/16/2022   Itching of vulva 07/28/2021   Screening breast examination 01/24/2019   Left breast mass 01/24/2019   Menorrhagia with regular cycle 09/21/2018    PCP: Reather Converse, PA-C  REFERRING PROVIDER: Vickki Hearing, MD  REFERRING DIAG: [phone] (ICD-10-CM) - S/P ACL reconstruction  THERAPY DIAG:  Difficulty walking  Weakness of right lower extremity  Right knee pain, unspecified chronicity  Rationale for Evaluation and Treatment: Rehabilitation  ONSET DATE: 09/08/2022  SUBJECTIVE:   SUBJECTIVE STATEMENT: Pt late for apt today.  Interpreter, Darlina Rumpf, present through session.  No reports of pain currently.  Reports most difficulty coming down stairs.    PERTINENT HISTORY: Dizziness, blood clots PAIN:  Are you having pain? Yes: NPRS scale: 0/10 Pain location: in front of the R knee Pain description: pulsating Aggravating factors: moving the knee, walking Relieving factors: rest  PRECAUTIONS: Other: Can take the brace when laying down. Needs to wear the brace locked in ext for 4 weeks when upright  WEIGHT BEARING RESTRICTIONS: Yes WBAT  FALLS:  Has patient fallen in last 6 months? No  LIVING ENVIRONMENT: Lives with: lives with their family Lives in: Mobile home Stairs: Yes: External: 7 steps; can reach both Has  following equipment at home: Crutches  OCCUPATION: in food preparation  PLOF: Independent  PATIENT GOALS: "to walk perfectly so I can get back to work"  NEXT MD VISIT: 12/31/22  OBJECTIVE:   DIAGNOSTIC FINDINGS:  MRI OF THE RIGHT KNEE WITHOUT CONTRAST 03/04/2022   IMPRESSION: 1.  Radial tear of the posterior horn of the lateral meniscus.   2. Thickening/partial-thickness tear of the medial collateral ligament,,  likely a chronic process.   2. Edema and thickening of the anterior cruciate ligament without evidence of discrete tear suggesting chronic ligamentous sprain.   3.  Moderate knee joint effusion.   4.  No evidence of fracture or osteonecrosis.  PATIENT SURVEYS:  FOTO  6/26: 67%,  6/3: 58%;   evaluation: 23%  COGNITION: Overall cognitive status: Within functional limits for tasks assessed     SENSATION: Not tested  EDEMA:  Slight swelling on R knee. Bandage present without drainage. Knee brace locked in extension  MUSCLE LENGTH: Moderate restriction on B hamstrings Mild restriction on B hamstrings  POSTURE: rounded shoulders and forward head  PALPATION: Grade 1 tenderness on major bony landmarks on the R knee  LOWER EXTREMITY ROM:  Active ROM Right eval Left eval Right 09/29/22 Right 10/28/22 Right 11/10/22 Right 11/23/22  Hip flexion [facility]      Hip extension [facility]      Hip abduction [facility]      Hip adduction        Hip internal rotation        Hip external rotation        Knee flexion 20 WFL 77 115 120 122  Knee extension 0 WFL 0 0 (left knee hyperextends) 0 0  Ankle dorsiflexion [facility]      Ankle plantarflexion [facility] WFL      Ankle inversion        Ankle eversion         (Blank rows = not tested)  LOWER EXTREMITY MMT:  MMT Right eval Left eval Right 11/23/22 Right 12/16/22  Hip flexion 2- 5 4+ 5  Hip extension 3+ 5    Hip abduction 3+ 5    Hip adduction      Hip internal rotation      Hip external rotation      Knee flexion  5 4+ 5  Knee extension  5 4 5   Ankle dorsiflexion 4 5 5    Ankle plantarflexion 4 5    Ankle inversion      Ankle eversion       (Blank rows = not tested)    FUNCTIONAL TESTS:  Evaluation:  2 minute walk test: 216 ft done with knee brace and B axillary crutches   11/23/22 FOTO 58 5 times sit to stand 10.82 sec no UE assist 2 MWT 445 ft no AD Right knee AROM 0-122 MMT's see above  12/16/22 FOTO 67% (was 58% at last  PN) 5 times sit to stand  5.31sec no UE (was 10.82 sec no UE assist) 2 MWT (goal met at last visit, NT) MMT's see above 1 rep max: hamstrings Rt: 45#  Lt:  55#  (82%)   Quadriceps Rt: 8# Lt: 20# (40%)  GAIT: Distance walked: 216 ft Assistive device utilized: Crutches Level of assistance: Complete Independence Comments: Modified 3-point pattern   TODAY'S TREATMENT:  DATE:  12/22/22: Standing:  Elliptical x 3' L1 Squat to heel raise with HHA 2x 10x SLS Rt 32" Lt 43" Vector stance 5x 5" Rt LE WB 6in forward step up 15 no HHA 6in lateral step up 15x no HHA 6in step down 15x 1 HHA Forward lunges attempted on floor, decreased pain with 2in step height  Leg press 2Pl Rt LE only 2x 10 reps  12/16/22 Bike seat 8 level 2 5 minutes Progress Note FOTO 67% (was 58% at last PN) 5 times sit to stand  5.31sec no UE (was 10.82 sec no UE assist) 2 MWT (goal met at last visit, NT) MMT's see above 1 rep max: hamstrings Rt: 45#  Lt:  55#  (82%)   Quadriceps Rt: 8# Lt: 20# (40%)  12/03/22 All exercises completed without brace on Compression garment donning/doffing/education of wear and wash Bike seat 8 level 2 5 minutes Standing: Squat to heel raise min HHA 20X  6" Rt forward step up with power up no UE 20X  4" Rt eccentric lowering Lt heel tap lateral step down 1 HHA 20X  4" Rt eccentric lowering Lt heel tap forward step down 2X10 with 1 HHA  4" Rt lead forward lunges no UE assist 2X10  Rt vector stance with Lt 5" holds, 1 HHA  11/30/22 Seat 8 bike x 5' level 2 dynamic warm up Educated benefits with compression garments with measurements taken and given printout  Standing:  Squat to heel raise minimal HHA 20x Vector stance 5x 5" holds Step up 6in 20x Step downs 4" box x 20 Lateral step downs 4" box x 20 Lunge onto 2in step no UE A   11/25/22 Seat 8 bike x 5'  level 2 dynamic warm up Standing:  Squat to heel raise minimal HHA 20x Step up 6in 20x Step downs 4" box x 20 Lateral step downs 4" box x 20 Lunge onto 4in step no UE A Leg press 3 plates 2 x 10 both legs; right leg 2 plates 2 x 10  kinesiotape for swelling right knee   11/23/2022 Progress note FOTO 58 5 times sit to stand 10.82 sec no UE assist 2 MWT 445 ft no AD Right knee AROM 0-122 MMT's see above  Step ups 6" box 2 x 10 no 1 assist Step downs 4" box x 20 Lateral step downs 4" box x 20 Leg press 3 plates 2 x 10 both legs; right leg 2 plates 2 x 10   11/19/22 Seat 9 bike x 5' level 3 dynamic warm up Instruction/ taping the knee for swelling/ kinesiotape Standing: Heel/toe raises on incline x 20 Squats x 20 8" box step ups 2 x 10 [employer] TKE 3 plates x 20 with 1 UE assist Walkouts 3 plates retro x 10  11/17/22 Seat 9 bike x 5' level 2 dynamic warm up  Standing: Heel/toe raises 2 x 10 Squats 2 x 10 6" step ups 2 x 10 [employer] TKE's 2 plates x 20 Walkouts 2 plates retro x 10  Trial of kineotape for swelling right knee    11/12/22 Rocker board x 2 minutes RT/LT Heel raise x 15 Functional squat x 10  Single leg stance x 5  Step up 6" x 15 Step down 2" x 15  Wall sits x 10 hold for 5"  Stool scoot  x 2 RT  Slant board stretch 30" x 3  Walking backward x 2 RT  Prone knee flexion 3# x 10, 5# x  10  SLR with 3# x 10   PATIENT EDUCATION:  Education details: Educated on the goals and course of rehab. Written HEP provided and reviewed Person educated: Patient Education method: Explanation, Demonstration, and Handouts Education comprehension: verbalized understanding and returned demonstration  HOME EXERCISE PROGRAM: Access Code: [email or web address] URL: https- Mini Squat with Counter Support  - 1 x daily - 7 x weekly - 1 sets - 10 reps - 5" hold - Single Leg Stance  - 1 x daily - 7 x weekly - 1 sets - 5 reps - Backward Walking with Counter Support  - 1 x daily - 7 x  weekly - 1 sets - 10 [email or web address] 5/24  Date: 09/29/2022 Prepared by: Emeline Gins Exercises - Active Straight Leg Raise with Quad Set  - 2 x daily - 7 x weekly - 3 sets - 10 reps - Sidelying Hip Abduction  - 2 x daily - 7 x weekly - 3 sets - 10 reps - Sidelying Hip Adduction  - 3 x daily - 7 x weekly - 3 sets - 10 reps - Prone Hip Extension  - 3 x daily - 7 x weekly - 3 sets - 10 reps - Prone Knee Flexion AROM  - 3 x daily - 7 x weekly - 3 sets - 10 reps  Date: 09/18/2022 Prepared by: Krystal Clark Exercises - Supine Heel Slide with Strap  - 2 x daily - 7 x weekly - 3 sets - 10 reps - Supine Quad Set  - 2 x daily - 7 x weekly - 3 sets - 10 reps - 6 hold  ASSESSMENT:  CLINICAL IMPRESSION:   Pt late for apt today.  Session focus with functional strengthening per 15 week post-op ACL protocol.  Pt progressing well towards goals and presents with improved quadriceps strength noted by ability to complete step down training with increased height.  Pt continues to demonstrate quad weakness noted by visible musculature fatigue following reps but improved ability with control.  Added elliptical this session with increased fatigue following dynamic warm up.  Pt able to complete whole session with no reports of pain, was limited by fatigue.    OBJECTIVE IMPAIRMENTS: Abnormal gait, decreased activity tolerance, decreased balance, difficulty walking, decreased ROM, decreased strength, impaired flexibility, and pain.   ACTIVITY LIMITATIONS: carrying, lifting, bending, sitting, squatting, sleeping, stairs, transfers, bed mobility, bathing, toileting, dressing, and hygiene/grooming  PARTICIPATION LIMITATIONS: meal prep, cleaning, laundry, driving, shopping, community activity, and occupation  PERSONAL FACTORS: Time since onset of injury/illness/exacerbation are also affecting patient's functional outcome.   REHAB POTENTIAL: Good  CLINICAL DECISION MAKING:  Stable/uncomplicated  EVALUATION COMPLEXITY: Low   GOALS: Goals reviewed with patient? Yes  SHORT TERM GOALS: Target date: 10/09/2022 Pt will demonstrate indep in HEP to facilitate carry-over of skilled services and improve functional outcomes Goal status: MET  2.  Patient will demonstrate increase in knee flex ROM to 120 degrees to facilitate ease in ambulation Baseline: 20 degrees Goal status: MET   LONG TERM GOALS: Target date: 10/30/2022  Pt will increase by at least 40 ft without assistive device in order to demonstrate clinically significant improvement in community ambulation Baseline: 216 ft; 11/23/22 445 ft Goal status: MET  2.  Pt will increase FOTO to the predicted pattern in order to demonstrate significant improvement in function related to ambulation  Baseline: 23.08; 58 (goal 65) 11/23/22 Goal status: MET  3.  Pt will demonstrate increase in LE strength to 5 to facilitate ease and  safety in ambulation  Baseline: 2-/5; see above Goal status: MET  4.  Pt will be able to perform SLS on the R to 15 sec (firm surface, eyes open( to facilitate ease and safety and ambulation Baseline: not performed Goal status: MET  5.  Patient will increase right leg MMTs to 5/5 without pain to promote return to ambulation community distances with minimal deviation.  Baseline: see above  Goal status: set 7/2 6.  Patient will increase right leg press strength to 80% of the left leg to promote return to full activity; working, family activity  Baseline: see above  Goal status; 40%     PLAN:  PT FREQUENCY: 3x/week  PT DURATION: 6 weeks  PLANNED INTERVENTIONS: Therapeutic exercises, Therapeutic activity, Neuromuscular re-education, Balance training, Gait training, Patient/Family education, Self Care, Stair training, and Manual therapy  PLAN FOR NEXT SESSION: Continue POC with higher level strengthening and stability. Progress eccentric strengthening, single leg strengthening;  more high level squats and challenges.   Becky Sax, LPTA/CLT; CBIS [phone]  Juel Burrow, PTA 12/22/2022, 2:07 PM  2:07 PM, 12/22/22

## 2022-12-30 ENCOUNTER — Encounter (HOSPITAL_COMMUNITY): Payer: Self-pay | Admitting: Physical Therapy

## 2022-12-31 ENCOUNTER — Ambulatory Visit (INDEPENDENT_AMBULATORY_CARE_PROVIDER_SITE_OTHER): Payer: Self-pay | Admitting: Orthopedic Surgery

## 2022-12-31 ENCOUNTER — Encounter: Payer: Self-pay | Admitting: Orthopedic Surgery

## 2022-12-31 VITALS — BP 109/75 | HR 57 | Ht 61.0 in | Wt 179.0 lb

## 2022-12-31 DIAGNOSIS — Z9889 Other specified postprocedural states: Secondary | ICD-10-CM

## 2022-12-31 DIAGNOSIS — S83231D Complex tear of medial meniscus, current injury, right knee, subsequent encounter: Secondary | ICD-10-CM

## 2022-12-31 DIAGNOSIS — S83511D Sprain of anterior cruciate ligament of right knee, subsequent encounter: Secondary | ICD-10-CM

## 2022-12-31 NOTE — Progress Notes (Signed)
Chief Complaint  Patient presents with   Knee Pain    Post op of right knee no pain can't go down steps  going up is hard still    Encounter Diagnoses  Name Primary?   S/P ACL reconstruction  QT 09/08/22 right Yes   Complex tear of medial meniscus of right knee as current injury, subsequent encounter    Rupture of anterior cruciate ligament of right knee, subsequent encounter     4 MOS P/O   QT ACL AUTOGRFT  DOING WELL EXCEPT// HAVING TROUBLE ON THE STAIRS  FELLS A LITTLE UNSTABLE AT TIMES WITH OUT BRACE   ROM = 3-125 V. -5 TO 140   LACHMAN IS GRADE 0   CONTINUE PT (HAS BEEN GETTING 1 / WEEK )   NEEDS TO GET BETTER QUAD FUNCTION   SHE PREFERS BRACE SO KEEP IT ON   SEE ME IN 2 MONTHS

## 2023-01-01 ENCOUNTER — Ambulatory Visit (HOSPITAL_COMMUNITY): Payer: Self-pay

## 2023-01-01 DIAGNOSIS — R262 Difficulty in walking, not elsewhere classified: Secondary | ICD-10-CM

## 2023-01-01 DIAGNOSIS — R29898 Other symptoms and signs involving the musculoskeletal system: Secondary | ICD-10-CM

## 2023-01-01 DIAGNOSIS — M25561 Pain in right knee: Secondary | ICD-10-CM

## 2023-01-01 NOTE — Therapy (Signed)
OUTPATIENT PHYSICAL THERAPY TREATMENT   Patient Name: Kristine Adams MRN: [id number] DOB:08/26/1985, 37 y.o., female Today's Date: 01/01/2023  END OF SESSION:   PT End of Session - 01/01/23 1303     Visit Number 18    Number of Visits 24    Date for PT Re-Evaluation 01/15/23    Authorization Type 50% [facility] [employer]    Progress Note Due on Visit 24    PT Start Time 1303    PT Stop Time 1343    PT Time Calculation (min) 40 min    Activity Tolerance Patient tolerated treatment well    Behavior During Therapy WFL for tasks assessed/performed                Past Medical History:  Diagnosis Date   Anemia    Hyperlipidemia    No pertinent past medical history    Past Surgical History:  Procedure Laterality Date   ANTERIOR CRUCIATE LIGAMENT REPAIR Right 09/08/2022   Procedure: ANTERIOR CRUCIATE LIGAMENT (ACL) REPAIR WITH QUADRICEPS AUTOGRAFT;  Surgeon: Vickki Hearing, MD;  Location: [facility];  Service: Orthopedics;  Laterality: Right;   BREAST LUMPECTOMY WITH RADIOACTIVE SEED LOCALIZATION Right 07/23/2020   Procedure: RIGHT BREAST LUMPECTOMYX 2  WITH RADIOACTIVE SEED LOCALIZATION;  Surgeon: Harriette Bouillon, MD;  Location: [facility];  Service: General;  Laterality: Right;   CESAREAN SECTION     CESAREAN SECTION WITH BILATERAL TUBAL LIGATION  07/11/2012   Procedure: CESAREAN SECTION WITH BILATERAL TUBAL LIGATION;  Surgeon: Tilda Burrow, MD;  Location: [facility];  Service: Obstetrics;  Laterality: Bilateral;  speaks spanish, interpreter needed   KNEE ARTHROSCOPY WITH MEDIAL MENISECTOMY Right 09/08/2022   Procedure: KNEE ARTHROSCOPY EXAM UNDER ANESTHESIA;  Surgeon: Vickki Hearing, MD;  Location: [facility];  Service: Orthopedics;  Laterality: Right;   TUBAL LIGATION     Patient Active Problem List   Diagnosis Date Noted   Rupture of anterior cruciate ligament of right knee 09/08/2022   Right shoulder pain 05/12/2022   History of DVT of  lower extremity 05/12/2022   Encounter for gynecological examination with Papanicolaou smear of cervix 05/12/2022   Right knee pain 05/12/2022   Dyspareunia, female 05/12/2022   DVT (deep venous thrombosis) (HCC) 03/16/2022   Itching of vulva 07/28/2021   Screening breast examination 01/24/2019   Left breast mass 01/24/2019   Menorrhagia with regular cycle 09/21/2018    PCP: Reather Converse, PA-C  REFERRING PROVIDER: Vickki Hearing, MD  REFERRING DIAG: [phone] (ICD-10-CM) - S/P ACL reconstruction  THERAPY DIAG:  Difficulty walking  Weakness of right lower extremity  Right knee pain, unspecified chronicity  Rationale for Evaluation and Treatment: Rehabilitation  ONSET DATE: 09/08/2022  SUBJECTIVE:   SUBJECTIVE STATEMENT: MD appt yesterday; MD wants her to come 2 x a week  PERTINENT HISTORY: Dizziness, blood clots PAIN:  Are you having pain? Yes: NPRS scale: 0/10 Pain location: in front of the R knee Pain description: pulsating Aggravating factors: moving the knee, walking Relieving factors: rest  PRECAUTIONS: Other: Can take the brace when laying down. Needs to wear the brace locked in ext for 4 weeks when upright  WEIGHT BEARING RESTRICTIONS: Yes WBAT  FALLS:  Has patient fallen in last 6 months? No  LIVING ENVIRONMENT: Lives with: lives with their family Lives in: Mobile home Stairs: Yes: External: 7 steps; can reach both Has following equipment at home: Crutches  OCCUPATION: in food preparation  PLOF: Independent  PATIENT GOALS: "to walk  perfectly so I can get back to work"  NEXT MD VISIT: 12/31/22  OBJECTIVE:   DIAGNOSTIC FINDINGS:  MRI OF THE RIGHT KNEE WITHOUT CONTRAST 03/04/2022   IMPRESSION: 1.  Radial tear of the posterior horn of the lateral meniscus.   2. Thickening/partial-thickness tear of the medial collateral ligament,, likely a chronic process.   2. Edema and thickening of the anterior cruciate ligament without evidence of  discrete tear suggesting chronic ligamentous sprain.   3.  Moderate knee joint effusion.   4.  No evidence of fracture or osteonecrosis.  PATIENT SURVEYS:  FOTO  6/26: 67%,  6/3: 58%;   evaluation: 23%  COGNITION: Overall cognitive status: Within functional limits for tasks assessed     SENSATION: Not tested  EDEMA:  Slight swelling on R knee. Bandage present without drainage. Knee brace locked in extension  MUSCLE LENGTH: Moderate restriction on B hamstrings Mild restriction on B hamstrings  POSTURE: rounded shoulders and forward head  PALPATION: Grade 1 tenderness on major bony landmarks on the R knee  LOWER EXTREMITY ROM:  Active ROM Right eval Left eval Right 09/29/22 Right 10/28/22 Right 11/10/22 Right 11/23/22  Hip flexion [facility]      Hip extension [facility]      Hip abduction [facility]      Hip adduction        Hip internal rotation        Hip external rotation        Knee flexion 20 WFL 77 115 120 122  Knee extension 0 WFL 0 0 (left knee hyperextends) 0 0  Ankle dorsiflexion [facility]      Ankle plantarflexion [facility] WFL      Ankle inversion        Ankle eversion         (Blank rows = not tested)  LOWER EXTREMITY MMT:  MMT Right eval Left eval Right 11/23/22 Right 12/16/22  Hip flexion 2- 5 4+ 5  Hip extension 3+ 5    Hip abduction 3+ 5    Hip adduction      Hip internal rotation      Hip external rotation      Knee flexion  5 4+ 5  Knee extension  5 4 5   Ankle dorsiflexion 4 5 5    Ankle plantarflexion 4 5    Ankle inversion      Ankle eversion       (Blank rows = not tested)    FUNCTIONAL TESTS:  Evaluation:  2 minute walk test: 216 ft done with knee brace and B axillary crutches   11/23/22 FOTO 58 5 times sit to stand 10.82 sec no UE assist 2 MWT 445 ft no AD Right knee AROM 0-122 MMT's see above  12/16/22 FOTO 67% (was 58% at last PN) 5 times sit to stand  5.31sec no UE (was 10.82 sec no UE assist) 2 MWT (goal met at last visit, NT) MMT's  see above 1 rep max: hamstrings Rt: 45#  Lt:  55#  (82%)   Quadriceps Rt: 8# Lt: 20# (40%)  GAIT: Distance walked: 216 ft Assistive device utilized: Crutches Level of assistance: Complete Independence Comments: Modified 3-point pattern   TODAY'S TREATMENT:  DATE:  01/01/23 Nustep seat 7 level 6 x 5' dynamic warm up  Sit to stand with left foot extended 2 x 10 from mat table Heel raises x 20 Slant board 5 x 20" Toe raises on incline x 20 Leg press 3 plates 3 x 5 Lateral Step downs x 15  12/22/22: Standing:  Elliptical x 3' L1 Squat to heel raise with HHA 2x 10x SLS Rt 32" Lt 43" Vector stance 5x 5" Rt LE WB 6in forward step up 15 no HHA 6in lateral step up 15x no HHA 6in step down 15x 1 HHA Forward lunges attempted on floor, decreased pain with 2in step height  Leg press 2Pl Rt LE only 2x 10 reps  12/16/22 Bike seat 8 level 2 5 minutes Progress Note FOTO 67% (was 58% at last PN) 5 times sit to stand  5.31sec no UE (was 10.82 sec no UE assist) 2 MWT (goal met at last visit, NT) MMT's see above 1 rep max: hamstrings Rt: 45#  Lt:  55#  (82%)   Quadriceps Rt: 8# Lt: 20# (40%)  12/03/22 All exercises completed without brace on Compression garment donning/doffing/education of wear and wash Bike seat 8 level 2 5 minutes Standing: Squat to heel raise min HHA 20X  6" Rt forward step up with power up no UE 20X  4" Rt eccentric lowering Lt heel tap lateral step down 1 HHA 20X  4" Rt eccentric lowering Lt heel tap forward step down 2X10 with 1 HHA  4" Rt lead forward lunges no UE assist 2X10  Rt vector stance with Lt 5" holds, 1 HHA  11/30/22 Seat 8 bike x 5' level 2 dynamic warm up Educated benefits with compression garments with measurements taken and given printout  Standing:  Squat to heel raise minimal HHA 20x Vector stance 5x 5" holds Step up  6in 20x Step downs 4" box x 20 Lateral step downs 4" box x 20 Lunge onto 2in step no UE A   11/25/22 Seat 8 bike x 5' level 2 dynamic warm up Standing:  Squat to heel raise minimal HHA 20x Step up 6in 20x Step downs 4" box x 20 Lateral step downs 4" box x 20 Lunge onto 4in step no UE A Leg press 3 plates 2 x 10 both legs; right leg 2 plates 2 x 10  kinesiotape for swelling right knee   11/23/2022 Progress note FOTO 58 5 times sit to stand 10.82 sec no UE assist 2 MWT 445 ft no AD Right knee AROM 0-122 MMT's see above  Step ups 6" box 2 x 10 no 1 assist Step downs 4" box x 20 Lateral step downs 4" box x 20 Leg press 3 plates 2 x 10 both legs; right leg 2 plates 2 x 10   11/19/22 Seat 9 bike x 5' level 3 dynamic warm up Instruction/ taping the knee for swelling/ kinesiotape Standing: Heel/toe raises on incline x 20 Squats x 20 8" box step ups 2 x 10 [employer] TKE 3 plates x 20 with 1 UE assist Walkouts 3 plates retro x 10  11/17/22 Seat 9 bike x 5' level 2 dynamic warm up  Standing: Heel/toe raises 2 x 10 Squats 2 x 10 6" step ups 2 x 10 [employer] TKE's 2 plates x 20 Walkouts 2 plates retro x 10  Trial of kineotape for swelling right knee    11/12/22 Rocker board x 2 minutes RT/LT Heel raise x 15 Functional squat x 10  Single leg stance x 5  Step up 6" x 15 Step down 2" x 15  Wall sits x 10 hold for 5"  Stool scoot  x 2 RT  Slant board stretch 30" x 3  Walking backward x 2 RT  Prone knee flexion 3# x 10, 5# x 10  SLR with 3# x 10   PATIENT EDUCATION:  Education details: Educated on the goals and course of rehab. Written HEP provided and reviewed Person educated: Patient Education method: Explanation, Demonstration, and Handouts Education comprehension: verbalized understanding and returned demonstration  HOME EXERCISE PROGRAM: Access Code: [email or web address] URL: [email or web address] Date: 01/01/2023 Prepared by: [facility]  Exercises -  Active Straight Leg Raise with Quad Set  - 2 x daily - 7 x weekly - 3 sets - 10 reps - Sidelying Hip Abduction  - 2 x daily - 7 x weekly - 3 sets - 10 reps - Prone Hip Extension  - 3 x daily - 7 x weekly - 3 sets - 10 reps - Heel Raises with Counter Support  - 2 x daily - 7 x weekly - 1 sets - 10 reps - Forward Step Up with Counter Support  - 2 x daily - 7 x weekly - 1 sets - 10 reps - Standing Hamstring Stretch with Step  - 2 x daily - 7 x weekly - 1 sets - 5 reps - 10 sec hold - Standing Knee Flexion Stretch on Step  - 2 x daily - 7 x weekly - 1 sets - 5 reps - 10 sec hold - Mini Squat with Counter Support  - 1 x daily - 7 x weekly - 1 sets - 10 reps - 5" hold - Single Leg Stance  - 1 x daily - 7 x weekly - 1 sets - 5 reps - Backward Walking with Counter Support  - 1 x daily - 7 x weekly - 1 sets - 10 reps - Staggered Sit-to-Stand  - 2 x daily - 7 x weekly - 2 sets - 5 reps - Lateral Step Down  - 2 x daily - 7 x weekly - 1 sets - 10 reps Access Code: [email or web address] URL: https- Mini Squat with Counter Support  - 1 x daily - 7 x weekly - 1 sets - 10 reps - 5" hold - Single Leg Stance  - 1 x daily - 7 x weekly - 1 sets - 5 reps - Backward Walking with Counter Support  - 1 x daily - 7 x weekly - 1 sets - 10 [email or web address] 5/24  Date: 09/29/2022 Prepared by: Emeline Gins Exercises - Active Straight Leg Raise with Quad Set  - 2 x daily - 7 x weekly - 3 sets - 10 reps - Sidelying Hip Abduction  - 2 x daily - 7 x weekly - 3 sets - 10 reps - Sidelying Hip Adduction  - 3 x daily - 7 x weekly - 3 sets - 10 reps - Prone Hip Extension  - 3 x daily - 7 x weekly - 3 sets - 10 reps - Prone Knee Flexion AROM  - 3 x daily - 7 x weekly - 3 sets - 10 reps  Date: 09/18/2022 Prepared by: Krystal Clark Exercises - Supine Heel Slide with Strap  - 2 x daily - 7 x weekly - 3 sets - 10 reps - Supine Quad Set  - 2 x daily - 7 x weekly - 3 sets - 10 reps - 6 hold  ASSESSMENT:  CLINICAL  IMPRESSION:   Focus on quad strengthening today; needs max verbal and physical cues for lateral step downs as she tends to dip her hip to step down versus bend the knee.  Increased plates with leg press and noted muscle fatigue last several reps.  Updated HEP.   Patient will benefit from continued skilled therapy services to address deficits and promote return to optimal function.       OBJECTIVE IMPAIRMENTS: Abnormal gait, decreased activity tolerance, decreased balance, difficulty walking, decreased ROM, decreased strength, impaired flexibility, and pain.   ACTIVITY LIMITATIONS: carrying, lifting, bending, sitting, squatting, sleeping, stairs, transfers, bed mobility, bathing, toileting, dressing, and hygiene/grooming  PARTICIPATION LIMITATIONS: meal prep, cleaning, laundry, driving, shopping, community activity, and occupation  PERSONAL FACTORS: Time since onset of injury/illness/exacerbation are also affecting patient's functional outcome.   REHAB POTENTIAL: Good  CLINICAL DECISION MAKING: Stable/uncomplicated  EVALUATION COMPLEXITY: Low   GOALS: Goals reviewed with patient? Yes  SHORT TERM GOALS: Target date: 10/09/2022 Pt will demonstrate indep in HEP to facilitate carry-over of skilled services and improve functional outcomes Goal status: MET  2.  Patient will demonstrate increase in knee flex ROM to 120 degrees to facilitate ease in ambulation Baseline: 20 degrees Goal status: MET   LONG TERM GOALS: Target date: 10/30/2022  Pt will increase by at least 40 ft without assistive device in order to demonstrate clinically significant improvement in community ambulation Baseline: 216 ft; 11/23/22 445 ft Goal status: MET  2.  Pt will increase FOTO to the predicted pattern in order to demonstrate significant improvement in function related to ambulation  Baseline: 23.08; 58 (goal 65) 11/23/22 Goal status: MET  3.  Pt will demonstrate increase in LE strength to 5 to  facilitate ease and safety in ambulation  Baseline: 2-/5; see above Goal status: MET  4.  Pt will be able to perform SLS on the R to 15 sec (firm surface, eyes open( to facilitate ease and safety and ambulation Baseline: not performed Goal status: MET  5.  Patient will increase right leg MMTs to 5/5 without pain to promote return to ambulation community distances with minimal deviation.  Baseline: see above  Goal status: set 7/2 6.  Patient will increase right leg press strength to 80% of the left leg to promote return to full activity; working, family activity  Baseline: see above  Goal status; 40%     PLAN:  PT FREQUENCY: 3x/week  PT DURATION: 6 weeks  PLANNED INTERVENTIONS: Therapeutic exercises, Therapeutic activity, Neuromuscular re-education, Balance training, Gait training, Patient/Family education, Self Care, Stair training, and Manual therapy  PLAN FOR NEXT SESSION: Continue POC with higher level strengthening and stability. Progress eccentric strengthening, single leg strengthening; more high level squats and challenges.   1:51 PM, 01/01/23 Baley Shands Small Tascha Casares MPT [facility] [place] [phone]

## 2023-01-06 ENCOUNTER — Ambulatory Visit (HOSPITAL_COMMUNITY): Payer: Self-pay

## 2023-01-06 ENCOUNTER — Ambulatory Visit (INDEPENDENT_AMBULATORY_CARE_PROVIDER_SITE_OTHER): Payer: Self-pay | Admitting: Orthopedic Surgery

## 2023-01-06 ENCOUNTER — Telehealth: Payer: Self-pay

## 2023-01-06 ENCOUNTER — Ambulatory Visit (HOSPITAL_COMMUNITY)
Admission: RE | Admit: 2023-01-06 | Discharge: 2023-01-06 | Disposition: A | Discharge status: Home / Self Care | Payer: Self-pay | Source: Ambulatory Visit | Attending: Orthopedic Surgery | Admitting: Orthopedic Surgery

## 2023-01-06 ENCOUNTER — Encounter (HOSPITAL_COMMUNITY): Payer: Self-pay

## 2023-01-06 ENCOUNTER — Encounter: Payer: Self-pay | Admitting: Orthopedic Surgery

## 2023-01-06 DIAGNOSIS — Z9889 Other specified postprocedural states: Secondary | ICD-10-CM

## 2023-01-06 DIAGNOSIS — R600 Localized edema: Secondary | ICD-10-CM | POA: Insufficient documentation

## 2023-01-06 DIAGNOSIS — R29898 Other symptoms and signs involving the musculoskeletal system: Secondary | ICD-10-CM

## 2023-01-06 DIAGNOSIS — R262 Difficulty in walking, not elsewhere classified: Secondary | ICD-10-CM

## 2023-01-06 DIAGNOSIS — Z86718 Personal history of other venous thrombosis and embolism: Secondary | ICD-10-CM

## 2023-01-06 DIAGNOSIS — I82463 Acute embolism and thrombosis of calf muscular vein, bilateral: Secondary | ICD-10-CM

## 2023-01-06 DIAGNOSIS — M25561 Pain in right knee: Secondary | ICD-10-CM

## 2023-01-06 NOTE — Therapy (Signed)
OUTPATIENT PHYSICAL THERAPY TREATMENT   Patient Name: Kristine Adams MRN: [id number] DOB:[birth year], 37 y.o., female Today's Date: 01/06/2023  END OF SESSION:   PT End of Session - 01/06/23 1338     Visit Number 19    Number of Visits 24    Date for PT Re-Evaluation 01/15/23    Authorization Type 50% [facility] [employer]    Progress Note Due on Visit 24    PT Start Time 1304    PT Stop Time 1327    PT Time Calculation (min) 23 min    Activity Tolerance Patient tolerated treatment well    Behavior During Therapy WFL for tasks assessed/performed                 Past Medical History:  Diagnosis Date   Anemia    Hyperlipidemia    No pertinent past medical history    Past Surgical History:  Procedure Laterality Date   ANTERIOR CRUCIATE LIGAMENT REPAIR Right 09/08/2022   Procedure: ANTERIOR CRUCIATE LIGAMENT (ACL) REPAIR WITH QUADRICEPS AUTOGRAFT;  Surgeon: Vickki Hearing, MD;  Location: [facility];  Service: Orthopedics;  Laterality: Right;   BREAST LUMPECTOMY WITH RADIOACTIVE SEED LOCALIZATION Right 07/23/2020   Procedure: RIGHT BREAST LUMPECTOMYX 2  WITH RADIOACTIVE SEED LOCALIZATION;  Surgeon: Harriette Bouillon, MD;  Location: [facility];  Service: General;  Laterality: Right;   CESAREAN SECTION     CESAREAN SECTION WITH BILATERAL TUBAL LIGATION  07/11/2012   Procedure: CESAREAN SECTION WITH BILATERAL TUBAL LIGATION;  Surgeon: Tilda Burrow, MD;  Location: [facility];  Service: Obstetrics;  Laterality: Bilateral;  speaks spanish, interpreter needed   KNEE ARTHROSCOPY WITH MEDIAL MENISECTOMY Right 09/08/2022   Procedure: KNEE ARTHROSCOPY EXAM UNDER ANESTHESIA;  Surgeon: Vickki Hearing, MD;  Location: [facility];  Service: Orthopedics;  Laterality: Right;   TUBAL LIGATION     Patient Active Problem List   Diagnosis Date Noted   Rupture of anterior cruciate ligament of right knee 09/08/2022   Right shoulder pain 05/12/2022   History of DVT of  lower extremity 05/12/2022   Encounter for gynecological examination with Papanicolaou smear of cervix 05/12/2022   Right knee pain 05/12/2022   Dyspareunia, female 05/12/2022   DVT (deep venous thrombosis) (HCC) 03/16/2022   Itching of vulva 07/28/2021   Screening breast examination 01/24/2019   Left breast mass 01/24/2019   Menorrhagia with regular cycle 09/21/2018    PCP: Reather Converse, PA-C  REFERRING PROVIDER: Vickki Hearing, MD  REFERRING DIAG: [id number] (ICD-10-CM) - S/P ACL reconstruction  THERAPY DIAG:  Difficulty walking  Weakness of right lower extremity  Right knee pain, unspecified chronicity  Rationale for Evaluation and Treatment: Rehabilitation  ONSET DATE: 09/08/2022  SUBJECTIVE:   SUBJECTIVE STATEMENT: MD allows pt to go without brace unless she needs it.  No reports of pain in knee.  Stated her feet are bothering her some yesterday.  Pt reports some concern of blood clot.  PERTINENT HISTORY: Dizziness, blood clots PAIN:  Are you having pain? Yes: NPRS scale: 0/10 Pain location: in front of the R knee Pain description: pulsating Aggravating factors: moving the knee, walking Relieving factors: rest  PRECAUTIONS: Other: Can take the brace when laying down. Needs to wear the brace locked in ext for 4 weeks when upright  WEIGHT BEARING RESTRICTIONS: Yes WBAT  FALLS:  Has patient fallen in last 6 months? No  LIVING ENVIRONMENT: Lives with: lives with their family Lives in: Mobile home Stairs: Yes: External:  7 steps; can reach both Has following equipment at home: Crutches  OCCUPATION: in food preparation  PLOF: Independent  PATIENT GOALS: "to walk perfectly so I can get back to work"  NEXT MD VISIT: 12/31/22  OBJECTIVE:   DIAGNOSTIC FINDINGS:  MRI OF THE RIGHT KNEE WITHOUT CONTRAST 03/04/2022   IMPRESSION: 1.  Radial tear of the posterior horn of the lateral meniscus.   2. Thickening/partial-thickness tear of the medial  collateral ligament,, likely a chronic process.   2. Edema and thickening of the anterior cruciate ligament without evidence of discrete tear suggesting chronic ligamentous sprain.   3.  Moderate knee joint effusion.   4.  No evidence of fracture or osteonecrosis.  PATIENT SURVEYS:  FOTO  6/26: 67%,  6/3: 58%;   evaluation: 23%  COGNITION: Overall cognitive status: Within functional limits for tasks assessed     SENSATION: Not tested  EDEMA:  Slight swelling on R knee. Bandage present without drainage. Knee brace locked in extension  MUSCLE LENGTH: Moderate restriction on B hamstrings Mild restriction on B hamstrings  POSTURE: rounded shoulders and forward head  PALPATION: Grade 1 tenderness on major bony landmarks on the R knee  LOWER EXTREMITY ROM:  Active ROM Right eval Left eval Right 09/29/22 Right 10/28/22 Right 11/10/22 Right 11/23/22  Hip flexion [facility]      Hip extension [facility]      Hip abduction [facility]      Hip adduction        Hip internal rotation        Hip external rotation        Knee flexion 20 WFL 77 115 120 122  Knee extension 0 WFL 0 0 (left knee hyperextends) 0 0  Ankle dorsiflexion [facility]      Ankle plantarflexion [facility] WFL      Ankle inversion        Ankle eversion         (Blank rows = not tested)  LOWER EXTREMITY MMT:  MMT Right eval Left eval Right 11/23/22 Right 12/16/22  Hip flexion 2- 5 4+ 5  Hip extension 3+ 5    Hip abduction 3+ 5    Hip adduction      Hip internal rotation      Hip external rotation      Knee flexion  5 4+ 5  Knee extension  5 4 5   Ankle dorsiflexion 4 5 5    Ankle plantarflexion 4 5    Ankle inversion      Ankle eversion       (Blank rows = not tested)    FUNCTIONAL TESTS:  Evaluation:  2 minute walk test: 216 ft done with knee brace and B axillary crutches   11/23/22 FOTO 58 5 times sit to stand 10.82 sec no UE assist 2 MWT 445 ft no AD Right knee AROM 0-122 MMT's see above  12/16/22 FOTO 67%  (was 58% at last PN) 5 times sit to stand  5.31sec no UE (was 10.82 sec no UE assist) 2 MWT (goal met at last visit, NT) MMT's see above 1 rep max: hamstrings Rt: 45#  Lt:  55#  (82%)   Quadriceps Rt: 8# Lt: 20# (40%)  GAIT: Distance walked: 216 ft Assistive device utilized: Crutches Level of assistance: Complete Independence Comments: Modified 3-point pattern   TODAY'S TREATMENT:  DATE:  01/06/23:  Elliptical x 4 min L1 2 up and 1 down heel raise Single leg squat- unable Squat with Lt foot on 6in step- reports of increased Lt calf pain  Homan's test- negative Educated s/s of blood clot.   01/01/23 Nustep seat 7 level 6 x 5' dynamic warm up  Sit to stand with left foot extended 2 x 10 from mat table Heel raises x 20 Slant board 5 x 20" Toe raises on incline x 20 Leg press 3 plates 3 x 5 Lateral Step downs x 15  12/22/22: Standing:  Elliptical x 3' L1 Squat to heel raise with HHA 2x 10x SLS Rt 32" Lt 43" Vector stance 5x 5" Rt LE WB 6in forward step up 15 no HHA 6in lateral step up 15x no HHA 6in step down 15x 1 HHA Forward lunges attempted on floor, decreased pain with 2in step height  Leg press 2Pl Rt LE only 2x 10 reps  12/16/22 Bike seat 8 level 2 5 minutes Progress Note FOTO 67% (was 58% at last PN) 5 times sit to stand  5.31sec no UE (was 10.82 sec no UE assist) 2 MWT (goal met at last visit, NT) MMT's see above 1 rep max: hamstrings Rt: 45#  Lt:  55#  (82%)   Quadriceps Rt: 8# Lt: 20# (40%)  12/03/22 All exercises completed without brace on Compression garment donning/doffing/education of wear and wash Bike seat 8 level 2 5 minutes Standing: Squat to heel raise min HHA 20X  6" Rt forward step up with power up no UE 20X  4" Rt eccentric lowering Lt heel tap lateral step down 1 HHA 20X  4" Rt eccentric lowering Lt heel tap forward  step down 2X10 with 1 HHA  4" Rt lead forward lunges no UE assist 2X10  Rt vector stance with Lt 5" holds, 1 HHA  11/30/22 Seat 8 bike x 5' level 2 dynamic warm up Educated benefits with compression garments with measurements taken and given printout  Standing:  Squat to heel raise minimal HHA 20x Vector stance 5x 5" holds Step up 6in 20x Step downs 4" box x 20 Lateral step downs 4" box x 20 Lunge onto 2in step no UE A   11/25/22 Seat 8 bike x 5' level 2 dynamic warm up Standing:  Squat to heel raise minimal HHA 20x Step up 6in 20x Step downs 4" box x 20 Lateral step downs 4" box x 20 Lunge onto 4in step no UE A Leg press 3 plates 2 x 10 both legs; right leg 2 plates 2 x 10  kinesiotape for swelling right knee   11/23/2022 Progress note FOTO 58 5 times sit to stand 10.82 sec no UE assist 2 MWT 445 ft no AD Right knee AROM 0-122 MMT's see above  Step ups 6" box 2 x 10 no 1 assist Step downs 4" box x 20 Lateral step downs 4" box x 20 Leg press 3 plates 2 x 10 both legs; right leg 2 plates 2 x 10   11/19/22 Seat 9 bike x 5' level 3 dynamic warm up Instruction/ taping the knee for swelling/ kinesiotape Standing: Heel/toe raises on incline x 20 Squats x 20 8" box step ups 2 x 10 [employer] TKE 3 plates x 20 with 1 UE assist Walkouts 3 plates retro x 10  11/17/22 Seat 9 bike x 5' level 2 dynamic warm up  Standing: Heel/toe raises 2 x 10 Squats 2 x 10 6" step ups  2 x 10 [employer] TKE's 2 plates x 20 Walkouts 2 plates retro x 10  Trial of kineotape for swelling right knee    11/12/22 Rocker board x 2 minutes RT/LT Heel raise x 15 Functional squat x 10  Single leg stance x 5  Step up 6" x 15 Step down 2" x 15  Wall sits x 10 hold for 5"  Stool scoot  x 2 RT  Slant board stretch 30" x 3  Walking backward x 2 RT  Prone knee flexion 3# x 10, 5# x 10  SLR with 3# x 10   PATIENT EDUCATION:  Education details: Educated on the goals and course of rehab. Written  HEP provided and reviewed Person educated: Patient Education method: Explanation, Demonstration, and Handouts Education comprehension: verbalized understanding and returned demonstration  HOME EXERCISE PROGRAM: Access Code: [email or web address] URL: [email or web address] Date: 01/01/2023 Prepared by: [facility]  Exercises - Active Straight Leg Raise with Quad Set  - 2 x daily - 7 x weekly - 3 sets - 10 reps - Sidelying Hip Abduction  - 2 x daily - 7 x weekly - 3 sets - 10 reps - Prone Hip Extension  - 3 x daily - 7 x weekly - 3 sets - 10 reps - Heel Raises with Counter Support  - 2 x daily - 7 x weekly - 1 sets - 10 reps - Forward Step Up with Counter Support  - 2 x daily - 7 x weekly - 1 sets - 10 reps - Standing Hamstring Stretch with Step  - 2 x daily - 7 x weekly - 1 sets - 5 reps - 10 sec hold - Standing Knee Flexion Stretch on Step  - 2 x daily - 7 x weekly - 1 sets - 5 reps - 10 sec hold - Mini Squat with Counter Support  - 1 x daily - 7 x weekly - 1 sets - 10 reps - 5" hold - Single Leg Stance  - 1 x daily - 7 x weekly - 1 sets - 5 reps - Backward Walking with Counter Support  - 1 x daily - 7 x weekly - 1 sets - 10 reps - Staggered Sit-to-Stand  - 2 x daily - 7 x weekly - 2 sets - 5 reps - Lateral Step Down  - 2 x daily - 7 x weekly - 1 sets - 10 reps Access Code: [email or web address] URL: https- Mini Squat with Counter Support  - 1 x daily - 7 x weekly - 1 sets - 10 reps - 5" hold - Single Leg Stance  - 1 x daily - 7 x weekly - 1 sets - 5 reps - Backward Walking with Counter Support  - 1 x daily - 7 x weekly - 1 sets - 10 [email or web address] 5/24  Date: 09/29/2022 Prepared by: Emeline Gins Exercises - Active Straight Leg Raise with Quad Set  - 2 x daily - 7 x weekly - 3 sets - 10 reps - Sidelying Hip Abduction  - 2 x daily - 7 x weekly - 3 sets - 10 reps - Sidelying Hip Adduction  - 3 x daily - 7 x weekly - 3 sets - 10 reps - Prone Hip Extension  - 3 x daily - 7 x  weekly - 3 sets - 10 reps - Prone Knee Flexion AROM  - 3 x daily - 7 x weekly - 3 sets - 10 reps  Date: 09/18/2022 Prepared by: Krystal Clark Exercises -  Supine Heel Slide with Strap  - 2 x daily - 7 x weekly - 3 sets - 10 reps - Supine Quad Set  - 2 x daily - 7 x weekly - 3 sets - 10 reps - 6 hold  ASSESSMENT:  CLINICAL IMPRESSION:   Pt arrived with reports of increased foot and calf pain BLE.  Pt with history of blood clot and worried she has another.  Pt was negative with Homan's sign.  Educated s/s of blood clot and encouraged to seek medical assistance if s/s occur.  Pt is currently on blood thinners.  Session focus with LE strengthening with increased SLS based exercises.  Pt unable to complete single leg squats due to weakness and difficulty with mechanics.  Added 6in step for increased Rt LE weight bearing.  Pt stated increased Lt calf pain with activity and reports "the pain feels different."  Session ended early per pt request, pt may seek medical attention later today at urgent care or tomorrow as clinic in [place] is closed on Wednesday.    OBJECTIVE IMPAIRMENTS: Abnormal gait, decreased activity tolerance, decreased balance, difficulty walking, decreased ROM, decreased strength, impaired flexibility, and pain.   ACTIVITY LIMITATIONS: carrying, lifting, bending, sitting, squatting, sleeping, stairs, transfers, bed mobility, bathing, toileting, dressing, and hygiene/grooming  PARTICIPATION LIMITATIONS: meal prep, cleaning, laundry, driving, shopping, community activity, and occupation  PERSONAL FACTORS: Time since onset of injury/illness/exacerbation are also affecting patient's functional outcome.   REHAB POTENTIAL: Good  CLINICAL DECISION MAKING: Stable/uncomplicated  EVALUATION COMPLEXITY: Low   GOALS: Goals reviewed with patient? Yes  SHORT TERM GOALS: Target date: 10/09/2022 Pt will demonstrate indep in HEP to facilitate carry-over of skilled services and improve  functional outcomes Goal status: MET  2.  Patient will demonstrate increase in knee flex ROM to 120 degrees to facilitate ease in ambulation Baseline: 20 degrees Goal status: MET   LONG TERM GOALS: Target date: 10/30/2022  Pt will increase by at least 40 ft without assistive device in order to demonstrate clinically significant improvement in community ambulation Baseline: 216 ft; 11/23/22 445 ft Goal status: MET  2.  Pt will increase FOTO to the predicted pattern in order to demonstrate significant improvement in function related to ambulation  Baseline: 23.08; 58 (goal 65) 11/23/22 Goal status: MET  3.  Pt will demonstrate increase in LE strength to 5 to facilitate ease and safety in ambulation  Baseline: 2-/5; see above Goal status: MET  4.  Pt will be able to perform SLS on the R to 15 sec (firm surface, eyes open( to facilitate ease and safety and ambulation Baseline: not performed Goal status: MET  5.  Patient will increase right leg MMTs to 5/5 without pain to promote return to ambulation community distances with minimal deviation.  Baseline: see above  Goal status: set 7/2 6.  Patient will increase right leg press strength to 80% of the left leg to promote return to full activity; working, family activity  Baseline: see above  Goal status; 40%     PLAN:  PT FREQUENCY: 3x/week  PT DURATION: 6 weeks  PLANNED INTERVENTIONS: Therapeutic exercises, Therapeutic activity, Neuromuscular re-education, Balance training, Gait training, Patient/Family education, Self Care, Stair training, and Manual therapy  PLAN FOR NEXT SESSION: Continue POC with higher level strengthening and stability. Progress eccentric strengthening, single leg strengthening; more high level squats and challenges.  Becky Sax, LPTA/CLT; CBIS [phone]  Juel Burrow, PTA 01/06/2023, 1:40 PM  1:40 PM, 01/06/23

## 2023-01-06 NOTE — Patient Instructions (Signed)
Go today NOW for the ultrasound at [facility]

## 2023-01-06 NOTE — Progress Notes (Signed)
Chief Complaint  Patient presents with   Routine Post Op    Therapist sent over to be checked for possible blood clot    36 year old female status post ACL reconstruction on September 08, 2022  Patient had a deep vein thrombosis which delayed surgery.  She was treated for that from July through February from 20 23-20 24  She presented to the therapist today for her regular therapy session complain of bilateral calf pain left greater than right although there was no swelling and negative Denna Haggard' sign  Encounter Diagnoses  Name Primary?   S/P ACL reconstruction    History of DVT (deep vein thrombosis) Yes   Acute deep vein thrombosis (DVT) of calf muscle vein of both lower extremities (HCC)     Recommend ultrasound rule out DVT.

## 2023-01-06 NOTE — Telephone Encounter (Signed)
Patient would like to be checked for bloot clot. Having a lot of pain during therapy session. Therapist told her to come here and let Dr. Romeo Apple know that she needs to be checked for clots. She states that therapist told her to go to [place] to have the test done.  Please advise

## 2023-01-08 ENCOUNTER — Ambulatory Visit (HOSPITAL_COMMUNITY): Payer: Self-pay

## 2023-01-08 DIAGNOSIS — R262 Difficulty in walking, not elsewhere classified: Secondary | ICD-10-CM

## 2023-01-08 DIAGNOSIS — M25561 Pain in right knee: Secondary | ICD-10-CM

## 2023-01-08 DIAGNOSIS — R29898 Other symptoms and signs involving the musculoskeletal system: Secondary | ICD-10-CM

## 2023-01-08 NOTE — Therapy (Signed)
OUTPATIENT PHYSICAL THERAPY TREATMENT   Patient Name: Kristine Adams MRN: [id number] DOB:November 25, 1985, 37 y.o., female Today's Date: 01/08/2023  END OF SESSION:   PT End of Session - 01/08/23 1306     Visit Number 20    Number of Visits 24    Date for PT Re-Evaluation 01/15/23    Authorization Type 50% [facility] [employer]    Progress Note Due on Visit 24    PT Start Time 1305    PT Stop Time 1345    PT Time Calculation (min) 40 min    Activity Tolerance Patient tolerated treatment well;Patient limited by pain   pt concerned of blood clot, session ended early to calf pain   Behavior During Therapy [facility] for tasks assessed/performed                 Past Medical History:  Diagnosis Date   Anemia    Hyperlipidemia    No pertinent past medical history    Past Surgical History:  Procedure Laterality Date   ANTERIOR CRUCIATE LIGAMENT REPAIR Right 09/08/2022   Procedure: ANTERIOR CRUCIATE LIGAMENT (ACL) REPAIR WITH QUADRICEPS AUTOGRAFT;  Surgeon: Vickki Hearing, MD;  Location: [facility];  Service: Orthopedics;  Laterality: Right;   BREAST LUMPECTOMY WITH RADIOACTIVE SEED LOCALIZATION Right 07/23/2020   Procedure: RIGHT BREAST LUMPECTOMYX 2  WITH RADIOACTIVE SEED LOCALIZATION;  Surgeon: Harriette Bouillon, MD;  Location: [facility];  Service: General;  Laterality: Right;   CESAREAN SECTION     CESAREAN SECTION WITH BILATERAL TUBAL LIGATION  07/11/2012   Procedure: CESAREAN SECTION WITH BILATERAL TUBAL LIGATION;  Surgeon: Tilda Burrow, MD;  Location: [facility];  Service: Obstetrics;  Laterality: Bilateral;  speaks spanish, interpreter needed   KNEE ARTHROSCOPY WITH MEDIAL MENISECTOMY Right 09/08/2022   Procedure: KNEE ARTHROSCOPY EXAM UNDER ANESTHESIA;  Surgeon: Vickki Hearing, MD;  Location: [facility];  Service: Orthopedics;  Laterality: Right;   TUBAL LIGATION     Patient Active Problem List   Diagnosis Date Noted   Rupture of anterior cruciate  ligament of right knee 09/08/2022   Right shoulder pain 05/12/2022   History of DVT of lower extremity 05/12/2022   Encounter for gynecological examination with Papanicolaou smear of cervix 05/12/2022   Right knee pain 05/12/2022   Dyspareunia, female 05/12/2022   DVT (deep venous thrombosis) (HCC) 03/16/2022   Itching of vulva 07/28/2021   Screening breast examination 01/24/2019   Left breast mass 01/24/2019   Menorrhagia with regular cycle 09/21/2018    PCP: Reather Converse, PA-C  REFERRING PROVIDER: Vickki Hearing, MD  REFERRING DIAG: [phone] (ICD-10-CM) - S/P ACL reconstruction  THERAPY DIAG:  Difficulty walking  Weakness of right lower extremity  Right knee pain, unspecified chronicity  Rationale for Evaluation and Treatment: Rehabilitation  ONSET DATE: 09/08/2022  SUBJECTIVE:   SUBJECTIVE STATEMENT: Ultrasound negative for blood clot; right shoulder bothering her some PERTINENT HISTORY: Dizziness, blood clots PAIN:  Are you having pain? Yes: NPRS scale: 0/10 Pain location: in front of the R knee Pain description: pulsating Aggravating factors: moving the knee, walking Relieving factors: rest  PRECAUTIONS: Other: Can take the brace when laying down. Needs to wear the brace locked in ext for 4 weeks when upright  WEIGHT BEARING RESTRICTIONS: Yes WBAT  FALLS:  Has patient fallen in last 6 months? No  LIVING ENVIRONMENT: Lives with: lives with their family Lives in: Mobile home Stairs: Yes: External: 7 steps; can reach both Has following equipment at home: Crutches  OCCUPATION: in food preparation  PLOF: Independent  PATIENT GOALS: "to walk perfectly so I can get back to work"  NEXT MD VISIT: 12/31/22  OBJECTIVE:   DIAGNOSTIC FINDINGS:  MRI OF THE RIGHT KNEE WITHOUT CONTRAST 03/04/2022   IMPRESSION: 1.  Radial tear of the posterior horn of the lateral meniscus.   2. Thickening/partial-thickness tear of the medial collateral ligament,,  likely a chronic process.   2. Edema and thickening of the anterior cruciate ligament without evidence of discrete tear suggesting chronic ligamentous sprain.   3.  Moderate knee joint effusion.   4.  No evidence of fracture or osteonecrosis.  PATIENT SURVEYS:  FOTO  6/26: 67%,  6/3: 58%;   evaluation: 23%  COGNITION: Overall cognitive status: Within functional limits for tasks assessed     SENSATION: Not tested  EDEMA:  Slight swelling on R knee. Bandage present without drainage. Knee brace locked in extension  MUSCLE LENGTH: Moderate restriction on B hamstrings Mild restriction on B hamstrings  POSTURE: rounded shoulders and forward head  PALPATION: Grade 1 tenderness on major bony landmarks on the R knee  LOWER EXTREMITY ROM:  Active ROM Right eval Left eval Right 09/29/22 Right 10/28/22 Right 11/10/22 Right 11/23/22  Hip flexion [facility]      Hip extension [facility]      Hip abduction [facility]      Hip adduction        Hip internal rotation        Hip external rotation        Knee flexion 20 WFL 77 115 120 122  Knee extension 0 WFL 0 0 (left knee hyperextends) 0 0  Ankle dorsiflexion [facility]      Ankle plantarflexion [facility] WFL      Ankle inversion        Ankle eversion         (Blank rows = not tested)  LOWER EXTREMITY MMT:  MMT Right eval Left eval Right 11/23/22 Right 12/16/22  Hip flexion 2- 5 4+ 5  Hip extension 3+ 5    Hip abduction 3+ 5    Hip adduction      Hip internal rotation      Hip external rotation      Knee flexion  5 4+ 5  Knee extension  5 4 5   Ankle dorsiflexion 4 5 5    Ankle plantarflexion 4 5    Ankle inversion      Ankle eversion       (Blank rows = not tested)    FUNCTIONAL TESTS:  Evaluation:  2 minute walk test: 216 ft done with knee brace and B axillary crutches   11/23/22 FOTO 58 5 times sit to stand 10.82 sec no UE assist 2 MWT 445 ft no AD Right knee AROM 0-122 MMT's see above  12/16/22 FOTO 67% (was 58% at last  PN) 5 times sit to stand  5.31sec no UE (was 10.82 sec no UE assist) 2 MWT (goal met at last visit, NT) MMT's see above 1 rep max: hamstrings Rt: 45#  Lt:  55#  (82%)   Quadriceps Rt: 8# Lt: 20# (40%)  GAIT: Distance walked: 216 ft Assistive device utilized: Crutches Level of assistance: Complete Independence Comments: Modified 3-point pattern   TODAY'S TREATMENT:  DATE:  01/08/23 Nustep seat 7 level 6 x 5' dynamic warm up  Calf stretch on step 5 x 20" Calf raises on incline x 20 Leg press 3 plates 3 x 5 Sit to stand with left foot extended 2 x 10 chair no UE assist 6" box step up and over x 15 reps with 1 UE assist Squats 2 x 10   01/06/23:  Elliptical x 4 min L1 2 up and 1 down heel raise Single leg squat- unable Squat with Lt foot on 6in step- reports of increased Lt calf pain  Homan's test- negative Educated s/s of blood clot.   01/01/23 Nustep seat 7 level 6 x 5' dynamic warm up  Sit to stand with left foot extended 2 x 10 from mat table Heel raises x 20 Slant board 5 x 20" Toe raises on incline x 20 Leg press 3 plates 3 x 5 Lateral Step downs x 15  12/22/22: Standing:  Elliptical x 3' L1 Squat to heel raise with HHA 2x 10x SLS Rt 32" Lt 43" Vector stance 5x 5" Rt LE WB 6in forward step up 15 no HHA 6in lateral step up 15x no HHA 6in step down 15x 1 HHA Forward lunges attempted on floor, decreased pain with 2in step height  Leg press 2Pl Rt LE only 2x 10 reps  12/16/22 Bike seat 8 level 2 5 minutes Progress Note FOTO 67% (was 58% at last PN) 5 times sit to stand  5.31sec no UE (was 10.82 sec no UE assist) 2 MWT (goal met at last visit, NT) MMT's see above 1 rep max: hamstrings Rt: 45#  Lt:  55#  (82%)   Quadriceps Rt: 8# Lt: 20# (40%)  12/03/22 All exercises completed without brace on Compression garment  donning/doffing/education of wear and wash Bike seat 8 level 2 5 minutes Standing: Squat to heel raise min HHA 20X  6" Rt forward step up with power up no UE 20X  4" Rt eccentric lowering Lt heel tap lateral step down 1 HHA 20X  4" Rt eccentric lowering Lt heel tap forward step down 2X10 with 1 HHA  4" Rt lead forward lunges no UE assist 2X10  Rt vector stance with Lt 5" holds, 1 HHA  11/30/22 Seat 8 bike x 5' level 2 dynamic warm up Educated benefits with compression garments with measurements taken and given printout  Standing:  Squat to heel raise minimal HHA 20x Vector stance 5x 5" holds Step up 6in 20x Step downs 4" box x 20 Lateral step downs 4" box x 20 Lunge onto 2in step no UE A   11/25/22 Seat 8 bike x 5' level 2 dynamic warm up Standing:  Squat to heel raise minimal HHA 20x Step up 6in 20x Step downs 4" box x 20 Lateral step downs 4" box x 20 Lunge onto 4in step no UE A Leg press 3 plates 2 x 10 both legs; right leg 2 plates 2 x 10  kinesiotape for swelling right knee   11/23/2022 Progress note FOTO 58 5 times sit to stand 10.82 sec no UE assist 2 MWT 445 ft no AD Right knee AROM 0-122 MMT's see above  Step ups 6" box 2 x 10 no 1 assist Step downs 4" box x 20 Lateral step downs 4" box x 20 Leg press 3 plates 2 x 10 both legs; right leg 2 plates 2 x 10   11/19/22 Seat 9 bike x 5' level 3 dynamic warm up Instruction/  taping the knee for swelling/ kinesiotape Standing: Heel/toe raises on incline x 20 Squats x 20 8" box step ups 2 x 10 [employer] TKE 3 plates x 20 with 1 UE assist Walkouts 3 plates retro x 10  11/17/22 Seat 9 bike x 5' level 2 dynamic warm up  Standing: Heel/toe raises 2 x 10 Squats 2 x 10 6" step ups 2 x 10 [employer] TKE's 2 plates x 20 Walkouts 2 plates retro x 10  Trial of kineotape for swelling right knee    11/12/22 Rocker board x 2 minutes RT/LT Heel raise x 15 Functional squat x 10  Single leg stance x 5  Step up 6" x  15 Step down 2" x 15  Wall sits x 10 hold for 5"  Stool scoot  x 2 RT  Slant board stretch 30" x 3  Walking backward x 2 RT  Prone knee flexion 3# x 10, 5# x 10  SLR with 3# x 10   PATIENT EDUCATION:  Education details: Educated on the goals and course of rehab. Written HEP provided and reviewed Person educated: Patient Education method: Explanation, Demonstration, and Handouts Education comprehension: verbalized understanding and returned demonstration  HOME EXERCISE PROGRAM: Access Code: [email or web address] URL: [email or web address] Date: 01/01/2023 Prepared by: [facility]  Exercises - Active Straight Leg Raise with Quad Set  - 2 x daily - 7 x weekly - 3 sets - 10 reps - Sidelying Hip Abduction  - 2 x daily - 7 x weekly - 3 sets - 10 reps - Prone Hip Extension  - 3 x daily - 7 x weekly - 3 sets - 10 reps - Heel Raises with Counter Support  - 2 x daily - 7 x weekly - 1 sets - 10 reps - Forward Step Up with Counter Support  - 2 x daily - 7 x weekly - 1 sets - 10 reps - Standing Hamstring Stretch with Step  - 2 x daily - 7 x weekly - 1 sets - 5 reps - 10 sec hold - Standing Knee Flexion Stretch on Step  - 2 x daily - 7 x weekly - 1 sets - 5 reps - 10 sec hold - Mini Squat with Counter Support  - 1 x daily - 7 x weekly - 1 sets - 10 reps - 5" hold - Single Leg Stance  - 1 x daily - 7 x weekly - 1 sets - 5 reps - Backward Walking with Counter Support  - 1 x daily - 7 x weekly - 1 sets - 10 reps - Staggered Sit-to-Stand  - 2 x daily - 7 x weekly - 2 sets - 5 reps - Lateral Step Down  - 2 x daily - 7 x weekly - 1 sets - 10 reps Access Code: [email or web address] URL: https- Mini Squat with Counter Support  - 1 x daily - 7 x weekly - 1 sets - 10 reps - 5" hold - Single Leg Stance  - 1 x daily - 7 x weekly - 1 sets - 5 reps - Backward Walking with Counter Support  - 1 x daily - 7 x weekly - 1 sets - 10 [email or web address] 5/24  Date: 09/29/2022 Prepared by: Emeline Gins Exercises - Active Straight Leg Raise with Quad Set  - 2 x daily - 7 x weekly - 3 sets - 10 reps - Sidelying Hip Abduction  - 2 x daily - 7 x weekly - 3 sets - 10 reps - Sidelying  Hip Adduction  - 3 x daily - 7 x weekly - 3 sets - 10 reps - Prone Hip Extension  - 3 x daily - 7 x weekly - 3 sets - 10 reps - Prone Knee Flexion AROM  - 3 x daily - 7 x weekly - 3 sets - 10 reps  Date: 09/18/2022 Prepared by: Krystal Clark Exercises - Supine Heel Slide with Strap  - 2 x daily - 7 x weekly - 3 sets - 10 reps - Supine Quad Set  - 2 x daily - 7 x weekly - 3 sets - 10 reps - 6 hold  ASSESSMENT:  CLINICAL IMPRESSION:   Today's session continued to work on right quad strengthening.  Patient with improved control with sit to stand with left leg extended; improved ease of leg press today.  Continues with some pain with step downs but this improves with increasing her speed with step downs.  Patient will benefit from continued skilled therapy services to address deficits and promote return to optimal function.      OBJECTIVE IMPAIRMENTS: Abnormal gait, decreased activity tolerance, decreased balance, difficulty walking, decreased ROM, decreased strength, impaired flexibility, and pain.   ACTIVITY LIMITATIONS: carrying, lifting, bending, sitting, squatting, sleeping, stairs, transfers, bed mobility, bathing, toileting, dressing, and hygiene/grooming  PARTICIPATION LIMITATIONS: meal prep, cleaning, laundry, driving, shopping, community activity, and occupation  PERSONAL FACTORS: Time since onset of injury/illness/exacerbation are also affecting patient's functional outcome.   REHAB POTENTIAL: Good  CLINICAL DECISION MAKING: Stable/uncomplicated  EVALUATION COMPLEXITY: Low   GOALS: Goals reviewed with patient? Yes  SHORT TERM GOALS: Target date: 10/09/2022 Pt will demonstrate indep in HEP to facilitate carry-over of skilled services and improve functional outcomes Goal status:  MET  2.  Patient will demonstrate increase in knee flex ROM to 120 degrees to facilitate ease in ambulation Baseline: 20 degrees Goal status: MET   LONG TERM GOALS: Target date: 10/30/2022  Pt will increase by at least 40 ft without assistive device in order to demonstrate clinically significant improvement in community ambulation Baseline: 216 ft; 11/23/22 445 ft Goal status: MET  2.  Pt will increase FOTO to the predicted pattern in order to demonstrate significant improvement in function related to ambulation  Baseline: 23.08; 58 (goal 65) 11/23/22 Goal status: MET  3.  Pt will demonstrate increase in LE strength to 5 to facilitate ease and safety in ambulation  Baseline: 2-/5; see above Goal status: MET  4.  Pt will be able to perform SLS on the R to 15 sec (firm surface, eyes open( to facilitate ease and safety and ambulation Baseline: not performed Goal status: MET  5.  Patient will increase right leg MMTs to 5/5 without pain to promote return to ambulation community distances with minimal deviation.  Baseline: see above  Goal status: set 7/2 6.  Patient will increase right leg press strength to 80% of the left leg to promote return to full activity; working, family activity  Baseline: see above  Goal status; 40%     PLAN:  PT FREQUENCY: 3x/week  PT DURATION: 6 weeks  PLANNED INTERVENTIONS: Therapeutic exercises, Therapeutic activity, Neuromuscular re-education, Balance training, Gait training, Patient/Family education, Self Care, Stair training, and Manual therapy  PLAN FOR NEXT SESSION: Continue POC with higher level strengthening and stability. Progress eccentric strengthening, single leg strengthening; more high level squats and challenges. 1:52 PM, 01/08/23 [place] Jashaun Penrose MPT [facility] [place] [phone]

## 2023-01-12 ENCOUNTER — Ambulatory Visit (HOSPITAL_COMMUNITY): Payer: Self-pay

## 2023-01-12 ENCOUNTER — Telehealth (HOSPITAL_COMMUNITY): Payer: Self-pay

## 2023-01-12 ENCOUNTER — Ambulatory Visit: Payer: Self-pay | Admitting: Internal Medicine

## 2023-01-12 NOTE — Telephone Encounter (Signed)
No show, called and spoke to pt who stated she thought apt was at 3:00. When looking at her schedule she has apt at 3 at different facility. Reminded next apt date and time.   Becky Sax, LPTA/CLT; [place] [phone]

## 2023-01-14 ENCOUNTER — Ambulatory Visit (HOSPITAL_COMMUNITY): Payer: Self-pay

## 2023-01-14 ENCOUNTER — Encounter: Payer: Self-pay | Admitting: Physician Assistant

## 2023-01-14 DIAGNOSIS — R29898 Other symptoms and signs involving the musculoskeletal system: Secondary | ICD-10-CM

## 2023-01-14 DIAGNOSIS — R262 Difficulty in walking, not elsewhere classified: Secondary | ICD-10-CM

## 2023-01-14 DIAGNOSIS — M25561 Pain in right knee: Secondary | ICD-10-CM

## 2023-01-14 NOTE — Therapy (Signed)
OUTPATIENT PHYSICAL THERAPY TREATMENT/PROGRESS NOTE Progress Note Reporting Period 12/16/2022 to 01/14/2023  See note below for Objective Data and Assessment of Progress/Goals.       Patient Name: Kristine Adams MRN: [id number] DOB:[birth year], 37 y.o., female Today's Date: 01/14/2023  END OF SESSION:   PT End of Session - 01/14/23 1350     Visit Number 21    Number of Visits 24    Date for PT Re-Evaluation 01/15/23    Authorization Type 50% [facility] [employer]    Progress Note Due on Visit 24    PT Start Time 1350    PT Stop Time 1430    PT Time Calculation (min) 40 min    Activity Tolerance Patient tolerated treatment well;Patient limited by pain   pt concerned of blood clot, session ended early to calf pain   Behavior During Therapy [facility] for tasks assessed/performed                 Past Medical History:  Diagnosis Date   Anemia    Hyperlipidemia    No pertinent past medical history    Past Surgical History:  Procedure Laterality Date   ANTERIOR CRUCIATE LIGAMENT REPAIR Right 09/08/2022   Procedure: ANTERIOR CRUCIATE LIGAMENT (ACL) REPAIR WITH QUADRICEPS AUTOGRAFT;  Surgeon: Vickki Hearing, MD;  Location: [facility];  Service: Orthopedics;  Laterality: Right;   BREAST LUMPECTOMY WITH RADIOACTIVE SEED LOCALIZATION Right 07/23/2020   Procedure: RIGHT BREAST LUMPECTOMYX 2  WITH RADIOACTIVE SEED LOCALIZATION;  Surgeon: Harriette Bouillon, MD;  Location: [facility];  Service: General;  Laterality: Right;   CESAREAN SECTION     CESAREAN SECTION WITH BILATERAL TUBAL LIGATION  07/11/2012   Procedure: CESAREAN SECTION WITH BILATERAL TUBAL LIGATION;  Surgeon: Tilda Burrow, MD;  Location: [facility];  Service: Obstetrics;  Laterality: Bilateral;  speaks spanish, interpreter needed   KNEE ARTHROSCOPY WITH MEDIAL MENISECTOMY Right 09/08/2022   Procedure: KNEE ARTHROSCOPY EXAM UNDER ANESTHESIA;  Surgeon: Vickki Hearing, MD;  Location: [facility];   Service: Orthopedics;  Laterality: Right;   TUBAL LIGATION     Patient Active Problem List   Diagnosis Date Noted   Rupture of anterior cruciate ligament of right knee 09/08/2022   Right shoulder pain 05/12/2022   History of DVT of lower extremity 05/12/2022   Encounter for gynecological examination with Papanicolaou smear of cervix 05/12/2022   Right knee pain 05/12/2022   Dyspareunia, female 05/12/2022   DVT (deep venous thrombosis) (HCC) 03/16/2022   Itching of vulva 07/28/2021   Screening breast examination 01/24/2019   Left breast mass 01/24/2019   Menorrhagia with regular cycle 09/21/2018    PCP: Reather Converse, PA-C  REFERRING PROVIDER: Vickki Hearing, MD  REFERRING DIAG: [phone] (ICD-10-CM) - S/P ACL reconstruction  THERAPY DIAG:  Difficulty walking  Weakness of right lower extremity  Right knee pain, unspecified chronicity  Rationale for Evaluation and Treatment: Rehabilitation  ONSET DATE: 09/08/2022  SUBJECTIVE:   SUBJECTIVE STATEMENT: Ultrasound negative for blood clot; right shoulder bothering her some PERTINENT HISTORY: Dizziness, blood clots PAIN:  Are you having pain? Yes: NPRS scale: 0/10 Pain location: in front of the R knee Pain description: pulsating Aggravating factors: moving the knee, walking Relieving factors: rest  PRECAUTIONS: Other: Can take the brace when laying down. Needs to wear the brace locked in ext for 4 weeks when upright  WEIGHT BEARING RESTRICTIONS: Yes WBAT  FALLS:  Has patient fallen in last 6 months? No  LIVING ENVIRONMENT: Lives  with: lives with their family Lives in: Mobile home Stairs: Yes: External: 7 steps; can reach both Has following equipment at home: Crutches  OCCUPATION: in food preparation  PLOF: Independent  PATIENT GOALS: "to walk perfectly so I can get back to work"  NEXT MD VISIT: 12/31/22  OBJECTIVE:   DIAGNOSTIC FINDINGS:  MRI OF THE RIGHT KNEE WITHOUT CONTRAST 03/04/2022    IMPRESSION: 1.  Radial tear of the posterior horn of the lateral meniscus.   2. Thickening/partial-thickness tear of the medial collateral ligament,, likely a chronic process.   2. Edema and thickening of the anterior cruciate ligament without evidence of discrete tear suggesting chronic ligamentous sprain.   3.  Moderate knee joint effusion.   4.  No evidence of fracture or osteonecrosis.  PATIENT SURVEYS:  FOTO  6/26: 67%,  6/3: 58%;   evaluation: 23%  COGNITION: Overall cognitive status: Within functional limits for tasks assessed     SENSATION: Not tested  EDEMA:  Slight swelling on R knee. Bandage present without drainage. Knee brace locked in extension  MUSCLE LENGTH: Moderate restriction on B hamstrings Mild restriction on B hamstrings  POSTURE: rounded shoulders and forward head  PALPATION: Grade 1 tenderness on major bony landmarks on the R knee  LOWER EXTREMITY ROM:  Active ROM Right eval Left eval Right 09/29/22 Right 10/28/22 Right 11/10/22 Right 11/23/22 Right 01/14/23  Hip flexion [facility]       Hip extension [facility]       Hip abduction [facility]       Hip adduction         Hip internal rotation         Hip external rotation         Knee flexion 20 WFL 77 115 120 122 128  Knee extension 0 WFL 0 0 (left knee hyperextends) 0 0 0  Ankle dorsiflexion [facility]       Ankle plantarflexion [facility] WFL       Ankle inversion         Ankle eversion          (Blank rows = not tested)  LOWER EXTREMITY MMT:  MMT Right eval Left eval Right 11/23/22 Right 12/16/22  Hip flexion 2- 5 4+ 5  Hip extension 3+ 5    Hip abduction 3+ 5    Hip adduction      Hip internal rotation      Hip external rotation      Knee flexion  5 4+ 5  Knee extension  5 4 5   Ankle dorsiflexion 4 5 5    Ankle plantarflexion 4 5    Ankle inversion      Ankle eversion       (Blank rows = not tested)    FUNCTIONAL TESTS:  Evaluation:  2 minute walk test: 216 ft done with knee brace and B  axillary crutches   11/23/22 FOTO 58 5 times sit to stand 10.82 sec no UE assist 2 MWT 445 ft no AD Right knee AROM 0-122 MMT's see above  12/16/22 FOTO 67% (was 58% at last PN) 5 times sit to stand  5.31sec no UE (was 10.82 sec no UE assist) 2 MWT (goal met at last visit, NT) MMT's see above 1 rep max: hamstrings Rt: 45#  Lt:  55#  (82%)   Quadriceps Rt: 8# Lt: 20# (40%)  GAIT: Distance walked: 216 ft Assistive device utilized: Crutches Level of assistance: Complete Independence Comments: Modified 3-point pattern  TODAY'S TREATMENT:                                                                                                                              DATE:  01/14/23 Nustep seat 7 level 6 x 5' dynamic warm up AROM and MMT's see above SLR x 10 no lag noted 1 rep max: Leg press left leg 7 plates; right 4 plates (Right 16% of left) Right leg press 3 plates 3 x 5 Calf stretch on step 3 x 20" each Sit to stand from mat table no UE assist with left foot on heel x 10 Front squat to mat table 10# x 10    01/08/23 Nustep seat 7 level 6 x 5' dynamic warm up  Calf stretch on step 5 x 20" Calf raises on incline x 20 Leg press 3 plates 3 x 5 Sit to stand with left foot extended 2 x 10 chair no UE assist 6" box step up and over x 15 reps with 1 UE assist Squats 2 x 10   01/06/23:  Elliptical x 4 min L1 2 up and 1 down heel raise Single leg squat- unable Squat with Lt foot on 6in step- reports of increased Lt calf pain  Homan's test- negative Educated s/s of blood clot.   01/01/23 Nustep seat 7 level 6 x 5' dynamic warm up  Sit to stand with left foot extended 2 x 10 from mat table Heel raises x 20 Slant board 5 x 20" Toe raises on incline x 20 Leg press 3 plates 3 x 5 Lateral Step downs x 15  12/22/22: Standing:  Elliptical x 3' L1 Squat to heel raise with HHA 2x 10x SLS Rt 32" Lt 43" Vector stance 5x 5" Rt LE WB 6in forward step up 15 no HHA 6in lateral  step up 15x no HHA 6in step down 15x 1 HHA Forward lunges attempted on floor, decreased pain with 2in step height  Leg press 2Pl Rt LE only 2x 10 reps  12/16/22 Bike seat 8 level 2 5 minutes Progress Note FOTO 67% (was 58% at last PN) 5 times sit to stand  5.31sec no UE (was 10.82 sec no UE assist) 2 MWT (goal met at last visit, NT) MMT's see above 1 rep max: hamstrings Rt: 45#  Lt:  55#  (82%)   Quadriceps Rt: 80# Lt: 20# (40%)  12/03/22 All exercises completed without brace on Compression garment donning/doffing/education of wear and wash Bike seat 8 level 2 5 minutes Standing: Squat to heel raise min HHA 20X  6" Rt forward step up with power up no UE 20X  4" Rt eccentric lowering Lt heel tap lateral step down 1 HHA 20X  4" Rt eccentric lowering Lt heel tap forward step down 2X10 with 1 HHA  4" Rt lead forward lunges no UE assist 2X10  Rt vector stance with Lt 5" holds, 1 HHA  11/30/22 Seat 8 bike x  5' level 2 dynamic warm up Educated benefits with compression garments with measurements taken and given printout  Standing:  Squat to heel raise minimal HHA 20x Vector stance 5x 5" holds Step up 6in 20x Step downs 4" box x 20 Lateral step downs 4" box x 20 Lunge onto 2in step no UE A   11/25/22 Seat 8 bike x 5' level 2 dynamic warm up Standing:  Squat to heel raise minimal HHA 20x Step up 6in 20x Step downs 4" box x 20 Lateral step downs 4" box x 20 Lunge onto 4in step no UE A Leg press 3 plates 2 x 10 both legs; right leg 2 plates 2 x 10  kinesiotape for swelling right knee   11/23/2022 Progress note FOTO 58 5 times sit to stand 10.82 sec no UE assist 2 MWT 445 ft no AD Right knee AROM 0-122 MMT's see above  Step ups 6" box 2 x 10 no 1 assist Step downs 4" box x 20 Lateral step downs 4" box x 20 Leg press 3 plates 2 x 10 both legs; right leg 2 plates 2 x 10   11/19/22 Seat 9 bike x 5' level 3 dynamic warm up Instruction/ taping the knee for swelling/  kinesiotape Standing: Heel/toe raises on incline x 20 Squats x 20 8" box step ups 2 x 10 [employer] TKE 3 plates x 20 with 1 UE assist Walkouts 3 plates retro x 10  11/17/22 Seat 9 bike x 5' level 2 dynamic warm up  Standing: Heel/toe raises 2 x 10 Squats 2 x 10 6" step ups 2 x 10 [employer] TKE's 2 plates x 20 Walkouts 2 plates retro x 10  Trial of kineotape for swelling right knee    11/12/22 Rocker board x 2 minutes RT/LT Heel raise x 15 Functional squat x 10  Single leg stance x 5  Step up 6" x 15 Step down 2" x 15  Wall sits x 10 hold for 5"  Stool scoot  x 2 RT  Slant board stretch 30" x 3  Walking backward x 2 RT  Prone knee flexion 3# x 10, 5# x 10  SLR with 3# x 10   PATIENT EDUCATION:  Education details: Educated on the goals and course of rehab. Written HEP provided and reviewed Person educated: Patient Education method: Explanation, Demonstration, and Handouts Education comprehension: verbalized understanding and returned demonstration  HOME EXERCISE PROGRAM: Access Code: [email or web address] URL: [email or web address] Date: 01/01/2023 Prepared by: [facility]  Exercises - Active Straight Leg Raise with Quad Set  - 2 x daily - 7 x weekly - 3 sets - 10 reps - Sidelying Hip Abduction  - 2 x daily - 7 x weekly - 3 sets - 10 reps - Prone Hip Extension  - 3 x daily - 7 x weekly - 3 sets - 10 reps - Heel Raises with Counter Support  - 2 x daily - 7 x weekly - 1 sets - 10 reps - Forward Step Up with Counter Support  - 2 x daily - 7 x weekly - 1 sets - 10 reps - Standing Hamstring Stretch with Step  - 2 x daily - 7 x weekly - 1 sets - 5 reps - 10 sec hold - Standing Knee Flexion Stretch on Step  - 2 x daily - 7 x weekly - 1 sets - 5 reps - 10 sec hold - Mini Squat with Counter Support  - 1 x daily - 7 x weekly -  1 sets - 10 reps - 5" hold - Single Leg Stance  - 1 x daily - 7 x weekly - 1 sets - 5 reps - Backward Walking with Counter Support  - 1 x daily - 7 x  weekly - 1 sets - 10 reps - Staggered Sit-to-Stand  - 2 x daily - 7 x weekly - 2 sets - 5 reps - Lateral Step Down  - 2 x daily - 7 x weekly - 1 sets - 10 reps Access Code: [email or web address] URL: https- Mini Squat with Counter Support  - 1 x daily - 7 x weekly - 1 sets - 10 reps - 5" hold - Single Leg Stance  - 1 x daily - 7 x weekly - 1 sets - 5 reps - Backward Walking with Counter Support  - 1 x daily - 7 x weekly - 1 sets - 10 [email or web address] 5/24  Date: 09/29/2022 Prepared by: Emeline Gins Exercises - Active Straight Leg Raise with Quad Set  - 2 x daily - 7 x weekly - 3 sets - 10 reps - Sidelying Hip Abduction  - 2 x daily - 7 x weekly - 3 sets - 10 reps - Sidelying Hip Adduction  - 3 x daily - 7 x weekly - 3 sets - 10 reps - Prone Hip Extension  - 3 x daily - 7 x weekly - 3 sets - 10 reps - Prone Knee Flexion AROM  - 3 x daily - 7 x weekly - 3 sets - 10 reps  Date: 09/18/2022 Prepared by: Krystal Clark Exercises - Supine Heel Slide with Strap  - 2 x daily - 7 x weekly - 3 sets - 10 reps - Supine Quad Set  - 2 x daily - 7 x weekly - 3 sets - 10 reps - 6 hold  ASSESSMENT:  CLINICAL IMPRESSION:   Progress note today.  Improving ROM noted; good quad set; improved Right quad strength/leg press max  to 57%; up from 40% last progress note.  Noted audible crepitus right patellofemoral joint with knee flexion and extension.  Patient will benefit from continued skilled therapy services  to address deficits and promote return to optimal function and to address remaining unmet therapy goals.      OBJECTIVE IMPAIRMENTS: Abnormal gait, decreased activity tolerance, decreased balance, difficulty walking, decreased ROM, decreased strength, impaired flexibility, and pain.   ACTIVITY LIMITATIONS: carrying, lifting, bending, sitting, squatting, sleeping, stairs, transfers, bed mobility, bathing, toileting, dressing, and hygiene/grooming  PARTICIPATION LIMITATIONS: meal prep, cleaning,  laundry, driving, shopping, community activity, and occupation  PERSONAL FACTORS: Time since onset of injury/illness/exacerbation are also affecting patient's functional outcome.   REHAB POTENTIAL: Good  CLINICAL DECISION MAKING: Stable/uncomplicated  EVALUATION COMPLEXITY: Low   GOALS: Goals reviewed with patient? Yes  SHORT TERM GOALS: Target date: 10/09/2022 Pt will demonstrate indep in HEP to facilitate carry-over of skilled services and improve functional outcomes Goal status: MET  2.  Patient will demonstrate increase in knee flex ROM to 120 degrees to facilitate ease in ambulation Baseline: 20 degrees Goal status: MET   LONG TERM GOALS: Target date: 10/30/2022  Pt will increase by at least 40 ft without assistive device in order to demonstrate clinically significant improvement in community ambulation Baseline: 216 ft; 11/23/22 445 ft Goal status: MET  2.  Pt will increase FOTO to the predicted pattern in order to demonstrate significant improvement in function related to ambulation  Baseline: 23.08; 58 (goal 65) 11/23/22 Goal status: MET  3.  Pt will demonstrate increase in LE strength to 5 to facilitate ease and safety in ambulation  Baseline: 2-/5; see above Goal status: MET  4.  Pt will be able to perform SLS on the R to 15 sec (firm surface, eyes open( to facilitate ease and safety and ambulation Baseline: not performed Goal status: MET  5.  Patient will increase right leg MMTs to 5/5 without pain to promote return to ambulation community distances with minimal deviation.  Baseline: see above  Goal status: set 7/2 6.  Patient will increase right leg press strength to 80% of the left leg to promote return to full activity; working, family activity  Baseline: see above;  6/26/241 rep max: hamstrings Rt: 45#  Lt:  55#  (82%)   Quadriceps left: 80# Rt: 20# (40%)  01/14/23 right leg 4 plates; left leg 7 plates (right at 57%)  Goal status; 40%      PLAN:  PT FREQUENCY: 3x/week  PT DURATION: 6 weeks  PLANNED INTERVENTIONS: Therapeutic exercises, Therapeutic activity, Neuromuscular re-education, Balance training, Gait training, Patient/Family education, Self Care, Stair training, and Manual therapy  PLAN FOR NEXT SESSION: Continue POC with higher level strengthening and stability. Progress eccentric strengthening, single leg strengthening; more high level squats and challenges. Continue therapy to address remaining unmet goals.   2:33 PM, 01/14/23 Arisbel Maione Small Margrett Kalb MPT [facility] [place] [phone]

## 2023-01-21 ENCOUNTER — Ambulatory Visit (HOSPITAL_COMMUNITY): Payer: Self-pay | Attending: Orthopedic Surgery | Admitting: Physical Therapy

## 2023-01-21 DIAGNOSIS — R262 Difficulty in walking, not elsewhere classified: Secondary | ICD-10-CM | POA: Insufficient documentation

## 2023-01-21 DIAGNOSIS — M25561 Pain in right knee: Secondary | ICD-10-CM | POA: Insufficient documentation

## 2023-01-21 DIAGNOSIS — R29898 Other symptoms and signs involving the musculoskeletal system: Secondary | ICD-10-CM | POA: Insufficient documentation

## 2023-01-21 NOTE — Therapy (Signed)
OUTPATIENT PHYSICAL THERAPY TREATMENT  Patient Name: Kristine Adams MRN: [id number] DOB:27-Jan-1986, 37 y.o., female Today's Date: 01/21/2023  END OF SESSION:   PT End of Session - 01/21/23 1447     Visit Number 22    Number of Visits 24    Date for PT Re-Evaluation 01/15/23    Authorization Type 50% [facility] [employer]    Progress Note Due on Visit 24    PT Start Time 1305    PT Stop Time 1350    PT Time Calculation (min) 45 min    Activity Tolerance Patient tolerated treatment well;Patient limited by pain   pt concerned of blood clot, session ended early to calf pain   Behavior During Therapy [facility] for tasks assessed/performed                  Past Medical History:  Diagnosis Date   Anemia    Hyperlipidemia    No pertinent past medical history    Past Surgical History:  Procedure Laterality Date   ANTERIOR CRUCIATE LIGAMENT REPAIR Right 09/08/2022   Procedure: ANTERIOR CRUCIATE LIGAMENT (ACL) REPAIR WITH QUADRICEPS AUTOGRAFT;  Surgeon: Vickki Hearing, MD;  Location: [facility];  Service: Orthopedics;  Laterality: Right;   BREAST LUMPECTOMY WITH RADIOACTIVE SEED LOCALIZATION Right 07/23/2020   Procedure: RIGHT BREAST LUMPECTOMYX 2  WITH RADIOACTIVE SEED LOCALIZATION;  Surgeon: Harriette Bouillon, MD;  Location: [facility];  Service: General;  Laterality: Right;   CESAREAN SECTION     CESAREAN SECTION WITH BILATERAL TUBAL LIGATION  07/11/2012   Procedure: CESAREAN SECTION WITH BILATERAL TUBAL LIGATION;  Surgeon: Tilda Burrow, MD;  Location: [facility];  Service: Obstetrics;  Laterality: Bilateral;  speaks spanish, interpreter needed   KNEE ARTHROSCOPY WITH MEDIAL MENISECTOMY Right 09/08/2022   Procedure: KNEE ARTHROSCOPY EXAM UNDER ANESTHESIA;  Surgeon: Vickki Hearing, MD;  Location: [facility];  Service: Orthopedics;  Laterality: Right;   TUBAL LIGATION     Patient Active Problem List   Diagnosis Date Noted   Rupture of anterior cruciate  ligament of right knee 09/08/2022   Right shoulder pain 05/12/2022   History of DVT of lower extremity 05/12/2022   Encounter for gynecological examination with Papanicolaou smear of cervix 05/12/2022   Right knee pain 05/12/2022   Dyspareunia, female 05/12/2022   DVT (deep venous thrombosis) (HCC) 03/16/2022   Itching of vulva 07/28/2021   Screening breast examination 01/24/2019   Left breast mass 01/24/2019   Menorrhagia with regular cycle 09/21/2018    PCP: Reather Converse, PA-C  REFERRING PROVIDER: Vickki Hearing, MD  REFERRING DIAG: [phone] (ICD-10-CM) - S/P ACL reconstruction  THERAPY DIAG:  Difficulty walking  Weakness of right lower extremity  Right knee pain, unspecified chronicity  Rationale for Evaluation and Treatment: Rehabilitation  ONSET DATE: 09/08/2022  SUBJECTIVE:   SUBJECTIVE STATEMENT:  Pt comes today with interpreter and daughter.  States she only has pain with certain movements.  Overall doing okay.  Weakness.  PERTINENT HISTORY: Dizziness, blood clots PAIN:  Are you having pain? Yes: NPRS scale: 0/10 Pain location: in front of the R knee Pain description: pulsating Aggravating factors: moving the knee, walking Relieving factors: rest  PRECAUTIONS: Other: Can take the brace when laying down. Needs to wear the brace locked in ext for 4 weeks when upright  WEIGHT BEARING RESTRICTIONS: Yes WBAT  FALLS:  Has patient fallen in last 6 months? No  LIVING ENVIRONMENT: Lives with: lives with their family Lives in: Mobile home  Stairs: Yes: External: 7 steps; can reach both Has following equipment at home: Crutches  OCCUPATION: in food preparation  PLOF: Independent  PATIENT GOALS: "to walk perfectly so I can get back to work"  NEXT MD VISIT: 12/31/22  OBJECTIVE:   DIAGNOSTIC FINDINGS:  MRI OF THE RIGHT KNEE WITHOUT CONTRAST 03/04/2022   IMPRESSION: 1.  Radial tear of the posterior horn of the lateral meniscus.   2.  Thickening/partial-thickness tear of the medial collateral ligament,, likely a chronic process.   2. Edema and thickening of the anterior cruciate ligament without evidence of discrete tear suggesting chronic ligamentous sprain.   3.  Moderate knee joint effusion.   4.  No evidence of fracture or osteonecrosis.  PATIENT SURVEYS:  FOTO  6/26: 67%,  6/3: 58%;   evaluation: 23%  COGNITION: Overall cognitive status: Within functional limits for tasks assessed     SENSATION: Not tested  EDEMA:  Slight swelling on R knee. Bandage present without drainage. Knee brace locked in extension  MUSCLE LENGTH: Moderate restriction on B hamstrings Mild restriction on B hamstrings  POSTURE: rounded shoulders and forward head  PALPATION: Grade 1 tenderness on major bony landmarks on the R knee  LOWER EXTREMITY ROM:  Active ROM Right eval Left eval Right 09/29/22 Right 10/28/22 Right 11/10/22 Right 11/23/22 Right 01/14/23  Hip flexion [facility]       Hip extension [facility]       Hip abduction [facility]       Hip adduction         Hip internal rotation         Hip external rotation         Knee flexion 20 WFL 77 115 120 122 128  Knee extension 0 WFL 0 0 (left knee hyperextends) 0 0 0  Ankle dorsiflexion [facility]       Ankle plantarflexion [facility] WFL       Ankle inversion         Ankle eversion          (Blank rows = not tested)  LOWER EXTREMITY MMT:  MMT Right eval Left eval Right 11/23/22 Right 12/16/22  Hip flexion 2- 5 4+ 5  Hip extension 3+ 5    Hip abduction 3+ 5    Hip adduction      Hip internal rotation      Hip external rotation      Knee flexion  5 4+ 5  Knee extension  5 4 5   Ankle dorsiflexion 4 5 5    Ankle plantarflexion 4 5    Ankle inversion      Ankle eversion       (Blank rows = not tested)    FUNCTIONAL TESTS:  Evaluation:  2 minute walk test: 216 ft done with knee brace and B axillary crutches   11/23/22 FOTO 58 5 times sit to stand 10.82 sec no UE  assist 2 MWT 445 ft no AD Right knee AROM 0-122 MMT's see above  12/16/22 FOTO 67% (was 58% at last PN) 5 times sit to stand  5.31sec no UE (was 10.82 sec no UE assist) 2 MWT (goal met at last visit, NT) MMT's see above 1 rep max: hamstrings Rt: 45#  Lt:  55#  (82%)   Quadriceps Rt: 8# Lt: 20# (40%)  GAIT: Distance walked: 216 ft Assistive device utilized: Crutches Level of assistance: Complete Independence Comments: Modified 3-point pattern   TODAY'S TREATMENT:  DATE:  01/21/23 Nustep seat 7 level 6; 5 min dynamic warm up Standing:  squats on aeromat 2X10 Sit to stand with left foot extended 2 x 10 chair no UE assist Forward lunges onto Rt LE no UE 2X10 4" step 2X10 step over and downs  4" step with 1" sliding board forward step downs 10X 6" forward step downs 10X 6" lateral heel taps 10X2 [employer] Leg press bil LE 3pL 10X  Rt only 2Pl 2X10 [employer] quad machine   01/14/23 Nustep seat 7 level 6 x 5' dynamic warm up AROM and MMT's see above SLR x 10 no lag noted 1 rep max: Leg press left leg 7 plates; right 4 plates (Right 40% of left) Right leg press 3 plates 3 x 5 Calf stretch on step 3 x 20" each Sit to stand from mat table no UE assist with left foot on heel x 10 Front squat to mat table 10# x 10  01/08/23 Nustep seat 7 level 6 x 5' dynamic warm up  Calf stretch on step 5 x 20" Calf raises on incline x 20 Leg press 3 plates 3 x 5 Sit to stand with left foot extended 2 x 10 chair no UE assist 6" box step up and over x 15 reps with 1 UE assist Squats 2 x 10   01/06/23:  Elliptical x 4 min L1 2 up and 1 down heel raise Single leg squat- unable Squat with Lt foot on 6in step- reports of increased Lt calf pain  Homan's test- negative Educated s/s of blood clot.   01/01/23 Nustep seat 7 level 6 x 5' dynamic warm up  Sit to stand with  left foot extended 2 x 10 from mat table Heel raises x 20 Slant board 5 x 20" Toe raises on incline x 20 Leg press 3 plates 3 x 5 Lateral Step downs x 15  12/22/22: Standing:  Elliptical x 3' L1 Squat to heel raise with HHA 2x 10x SLS Rt 32" Lt 43" Vector stance 5x 5" Rt LE WB 6in forward step up 15 no HHA 6in lateral step up 15x no HHA 6in step down 15x 1 HHA Forward lunges attempted on floor, decreased pain with 2in step height  Leg press 2Pl Rt LE only 2x 10 reps  12/16/22 Bike seat 8 level 2 5 minutes Progress Note FOTO 67% (was 58% at last PN) 5 times sit to stand  5.31sec no UE (was 10.82 sec no UE assist) 2 MWT (goal met at last visit, NT) MMT's see above 1 rep max: hamstrings Rt: 45#  Lt:  55#  (82%)   Quadriceps Rt: 80# Lt: 20# (40%)  12/03/22 All exercises completed without brace on Compression garment donning/doffing/education of wear and wash Bike seat 8 level 2 5 minutes Standing: Squat to heel raise min HHA 20X  6" Rt forward step up with power up no UE 20X  4" Rt eccentric lowering Lt heel tap lateral step down 1 HHA 20X  4" Rt eccentric lowering Lt heel tap forward step down 2X10 with 1 HHA  4" Rt lead forward lunges no UE assist 2X10  Rt vector stance with Lt 5" holds, 1 HHA  11/30/22 Seat 8 bike x 5' level 2 dynamic warm up Educated benefits with compression garments with measurements taken and given printout  Standing:  Squat to heel raise minimal HHA 20x Vector stance 5x 5" holds Step up 6in 20x Step downs 4" box x 20 Lateral step downs  4" box x 20 Lunge onto 2in step no UE A   11/25/22 Seat 8 bike x 5' level 2 dynamic warm up Standing:  Squat to heel raise minimal HHA 20x Step up 6in 20x Step downs 4" box x 20 Lateral step downs 4" box x 20 Lunge onto 4in step no UE A Leg press 3 plates 2 x 10 both legs; right leg 2 plates 2 x 10  kinesiotape for swelling right knee   11/23/2022 Progress note FOTO 58 5 times sit to stand 10.82 sec no UE  assist 2 MWT 445 ft no AD Right knee AROM 0-122 MMT's see above  Step ups 6" box 2 x 10 no 1 assist Step downs 4" box x 20 Lateral step downs 4" box x 20 Leg press 3 plates 2 x 10 both legs; right leg 2 plates 2 x 10   11/19/22 Seat 9 bike x 5' level 3 dynamic warm up Instruction/ taping the knee for swelling/ kinesiotape Standing: Heel/toe raises on incline x 20 Squats x 20 8" box step ups 2 x 10 [employer]craft TKE 3 plates x 20 with 1 UE assist Walkouts 3 plates retro x 10  11/17/22 Seat 9 bike x 5' level 2 dynamic warm up  Standing: Heel/toe raises 2 x 10 Squats 2 x 10 6" step ups 2 x 10 [employer]craft TKE's 2 plates x 20 Walkouts 2 plates retro x 10  Trial of kineotape for swelling right knee    11/12/22 Rocker board x 2 minutes RT/LT Heel raise x 15 Functional squat x 10  Single leg stance x 5  Step up 6" x 15 Step down 2" x 15  Wall sits x 10 hold for 5"  Stool scoot  x 2 RT  Slant board stretch 30" x 3  Walking backward x 2 RT  Prone knee flexion 3# x 10, 5# x 10  SLR with 3# x 10   PATIENT EDUCATION:  Education details: Educated on the goals and course of rehab. Written HEP provided and reviewed Person educated: Patient Education method: Explanation, Demonstration, and Handouts Education comprehension: verbalized understanding and returned demonstration  HOME EXERCISE PROGRAM: Access Code: [email or web address] URL: [email or web address] Date: 01/01/2023 Prepared by: [facility]  Exercises - Active Straight Leg Raise with Quad Set  - 2 x daily - 7 x weekly - 3 sets - 10 reps - Sidelying Hip Abduction  - 2 x daily - 7 x weekly - 3 sets - 10 reps - Prone Hip Extension  - 3 x daily - 7 x weekly - 3 sets - 10 reps - Heel Raises with Counter Support  - 2 x daily - 7 x weekly - 1 sets - 10 reps - Forward Step Up with Counter Support  - 2 x daily - 7 x weekly - 1 sets - 10 reps - Standing Hamstring Stretch with Step  - 2 x daily - 7 x weekly - 1 sets - 5 reps -  10 sec hold - Standing Knee Flexion Stretch on Step  - 2 x daily - 7 x weekly - 1 sets - 5 reps - 10 sec hold - Mini Squat with Counter Support  - 1 x daily - 7 x weekly - 1 sets - 10 reps - 5" hold - Single Leg Stance  - 1 x daily - 7 x weekly - 1 sets - 5 reps - Backward Walking with Counter Support  - 1 x daily - 7 x weekly -  1 sets - 10 reps - Staggered Sit-to-Stand  - 2 x daily - 7 x weekly - 2 sets - 5 reps - Lateral Step Down  - 2 x daily - 7 x weekly - 1 sets - 10 reps Access Code: [email or web address] URL: https- Mini Squat with Counter Support  - 1 x daily - 7 x weekly - 1 sets - 10 reps - 5" hold - Single Leg Stance  - 1 x daily - 7 x weekly - 1 sets - 5 reps - Backward Walking with Counter Support  - 1 x daily - 7 x weekly - 1 sets - 10 [email or web address] 5/24  Date: 09/29/2022 Prepared by: Emeline Gins Exercises - Active Straight Leg Raise with Quad Set  - 2 x daily - 7 x weekly - 3 sets - 10 reps - Sidelying Hip Abduction  - 2 x daily - 7 x weekly - 3 sets - 10 reps - Sidelying Hip Adduction  - 3 x daily - 7 x weekly - 3 sets - 10 reps - Prone Hip Extension  - 3 x daily - 7 x weekly - 3 sets - 10 reps - Prone Knee Flexion AROM  - 3 x daily - 7 x weekly - 3 sets - 10 reps  Date: 09/18/2022 Prepared by: Krystal Clark Exercises - Supine Heel Slide with Strap  - 2 x daily - 7 x weekly - 3 sets - 10 reps - Supine Quad Set  - 2 x daily - 7 x weekly - 3 sets - 10 reps - 6 hold  ASSESSMENT:  CLINICAL IMPRESSION:   Focused on Rt quad activation today.  Progressed to lunge with noted challenge/mm trembling with second set.  Still unable to ocmplete single leg squat but can stagger Rt LE back further for more challenge.   Initially with pain completing forward step down with 6" step but was able to start back with 4" and progress until able.  Noted mm trembling due to weakness.  Quadricep machine completed with therapist assist.  Pt unable to complete Rt without help from Lt.   Pt reported good quad workout today.  No pain at end of session. Patient will benefit from continued skilled therapy services  to address deficits and promote return to optimal function and to address remaining unmet therapy goals.      OBJECTIVE IMPAIRMENTS: Abnormal gait, decreased activity tolerance, decreased balance, difficulty walking, decreased ROM, decreased strength, impaired flexibility, and pain.   ACTIVITY LIMITATIONS: carrying, lifting, bending, sitting, squatting, sleeping, stairs, transfers, bed mobility, bathing, toileting, dressing, and hygiene/grooming  PARTICIPATION LIMITATIONS: meal prep, cleaning, laundry, driving, shopping, community activity, and occupation  PERSONAL FACTORS: Time since onset of injury/illness/exacerbation are also affecting patient's functional outcome.   REHAB POTENTIAL: Good  CLINICAL DECISION MAKING: Stable/uncomplicated  EVALUATION COMPLEXITY: Low   GOALS: Goals reviewed with patient? Yes  SHORT TERM GOALS: Target date: 10/09/2022 Pt will demonstrate indep in HEP to facilitate carry-over of skilled services and improve functional outcomes Goal status: MET  2.  Patient will demonstrate increase in knee flex ROM to 120 degrees to facilitate ease in ambulation Baseline: 20 degrees Goal status: MET   LONG TERM GOALS: Target date: 10/30/2022  Pt will increase by at least 40 ft without assistive device in order to demonstrate clinically significant improvement in community ambulation Baseline: 216 ft; 11/23/22 445 ft Goal status: MET  2.  Pt will increase FOTO to the predicted pattern in order to demonstrate significant improvement  in function related to ambulation  Baseline: 23.08; 58 (goal 65) 11/23/22 Goal status: MET  3.  Pt will demonstrate increase in LE strength to 5 to facilitate ease and safety in ambulation  Baseline: 2-/5; see above Goal status: MET  4.  Pt will be able to perform SLS on the R to 15 sec (firm surface, eyes  open( to facilitate ease and safety and ambulation Baseline: not performed Goal status: MET  5.  Patient will increase right leg MMTs to 5/5 without pain to promote return to ambulation community distances with minimal deviation.  Baseline: see above  Goal status: set 7/2 6.  Patient will increase right leg press strength to 80% of the left leg to promote return to full activity; working, family activity  Baseline: see above;  6/26/241 rep max: hamstrings Rt: 45#  Lt:  55#  (82%)   Quadriceps left: 80# Rt: 20# (40%)  01/14/23 right leg 4 plates; left leg 7 plates (right at 57%)  Goal status; 40%     PLAN:  PT FREQUENCY: 3x/week  PT DURATION: 6 weeks  PLANNED INTERVENTIONS: Therapeutic exercises, Therapeutic activity, Neuromuscular re-education, Balance training, Gait training, Patient/Family education, Self Care, Stair training, and Manual therapy  PLAN FOR NEXT SESSION: Continue POC with higher level strengthening and stability. Progress eccentric strengthening, single leg strengthening; more high level squats and challenges. Continue therapy to address remaining unmet goals.   2:47 PM, 01/21/23 Lurena Nida, PTA/CLT [facility] Ph: [phone]

## 2023-01-28 ENCOUNTER — Encounter (HOSPITAL_COMMUNITY): Payer: Self-pay

## 2023-01-28 NOTE — Addendum Note (Signed)
Addended byWilhemena Durie S on: 01/28/2023 07:40 AM   Modules accepted: Orders

## 2023-01-29 ENCOUNTER — Ambulatory Visit (HOSPITAL_COMMUNITY): Payer: Self-pay

## 2023-01-29 DIAGNOSIS — M25561 Pain in right knee: Secondary | ICD-10-CM

## 2023-01-29 DIAGNOSIS — R262 Difficulty in walking, not elsewhere classified: Secondary | ICD-10-CM

## 2023-01-29 DIAGNOSIS — R29898 Other symptoms and signs involving the musculoskeletal system: Secondary | ICD-10-CM

## 2023-01-29 NOTE — Therapy (Signed)
OUTPATIENT PHYSICAL THERAPY TREATMENT  Patient Name: Kristine Adams MRN: [id number] DOB:1986/05/22, 37 y.o., female Today's Date: 01/29/2023  END OF SESSION:   PT End of Session - 01/29/23 1350     Visit Number 23    Number of Visits 24    Date for PT Re-Evaluation 02/06/23    Authorization Type 50% [facility] [employer]    Progress Note Due on Visit 24    PT Start Time 1350    PT Stop Time 1430    PT Time Calculation (min) 40 min    Activity Tolerance Patient tolerated treatment well   pt concerned of blood clot, session ended early to calf pain   Behavior During Therapy [facility] for tasks assessed/performed                  Past Medical History:  Diagnosis Date   Anemia    Hyperlipidemia    No pertinent past medical history    Past Surgical History:  Procedure Laterality Date   ANTERIOR CRUCIATE LIGAMENT REPAIR Right 09/08/2022   Procedure: ANTERIOR CRUCIATE LIGAMENT (ACL) REPAIR WITH QUADRICEPS AUTOGRAFT;  Surgeon: Vickki Hearing, MD;  Location: [facility];  Service: Orthopedics;  Laterality: Right;   BREAST LUMPECTOMY WITH RADIOACTIVE SEED LOCALIZATION Right 07/23/2020   Procedure: RIGHT BREAST LUMPECTOMYX 2  WITH RADIOACTIVE SEED LOCALIZATION;  Surgeon: Harriette Bouillon, MD;  Location: [facility];  Service: General;  Laterality: Right;   CESAREAN SECTION     CESAREAN SECTION WITH BILATERAL TUBAL LIGATION  07/11/2012   Procedure: CESAREAN SECTION WITH BILATERAL TUBAL LIGATION;  Surgeon: Tilda Burrow, MD;  Location: [facility];  Service: Obstetrics;  Laterality: Bilateral;  speaks spanish, interpreter needed   KNEE ARTHROSCOPY WITH MEDIAL MENISECTOMY Right 09/08/2022   Procedure: KNEE ARTHROSCOPY EXAM UNDER ANESTHESIA;  Surgeon: Vickki Hearing, MD;  Location: [facility];  Service: Orthopedics;  Laterality: Right;   TUBAL LIGATION     Patient Active Problem List   Diagnosis Date Noted   Rupture of anterior cruciate ligament of right knee  09/08/2022   Right shoulder pain 05/12/2022   History of DVT of lower extremity 05/12/2022   Encounter for gynecological examination with Papanicolaou smear of cervix 05/12/2022   Right knee pain 05/12/2022   Dyspareunia, female 05/12/2022   DVT (deep venous thrombosis) (HCC) 03/16/2022   Itching of vulva 07/28/2021   Screening breast examination 01/24/2019   Left breast mass 01/24/2019   Menorrhagia with regular cycle 09/21/2018    PCP: Reather Converse, PA-C  REFERRING PROVIDER: Vickki Hearing, MD  REFERRING DIAG: [phone] (ICD-10-CM) - S/P ACL reconstruction  THERAPY DIAG:  Difficulty walking  Weakness of right lower extremity  Right knee pain, unspecified chronicity  Rationale for Evaluation and Treatment: Rehabilitation  ONSET DATE: 09/08/2022  SUBJECTIVE:   SUBJECTIVE STATEMENT:  Pt comes today with interpreter and daughter.  Only real issue now is with going down steps; some pain still with that but overall progressing well.   PERTINENT HISTORY: Dizziness, blood clots PAIN:  Are you having pain? Yes: NPRS scale: 0/10 Pain location: in front of the R knee Pain description: pulsating Aggravating factors: moving the knee, walking Relieving factors: rest  PRECAUTIONS: Other: Can take the brace when laying down. Needs to wear the brace locked in ext for 4 weeks when upright  WEIGHT BEARING RESTRICTIONS: Yes WBAT  FALLS:  Has patient fallen in last 6 months? No  LIVING ENVIRONMENT: Lives with: lives with their family Lives in:  Mobile home Stairs: Yes: External: 7 steps; can reach both Has following equipment at home: Crutches  OCCUPATION: in food preparation  PLOF: Independent  PATIENT GOALS: "to walk perfectly so I can get back to work"  NEXT MD VISIT: 12/31/22  OBJECTIVE:   DIAGNOSTIC FINDINGS:  MRI OF THE RIGHT KNEE WITHOUT CONTRAST 03/04/2022   IMPRESSION: 1.  Radial tear of the posterior horn of the lateral meniscus.   2.  Thickening/partial-thickness tear of the medial collateral ligament,, likely a chronic process.   2. Edema and thickening of the anterior cruciate ligament without evidence of discrete tear suggesting chronic ligamentous sprain.   3.  Moderate knee joint effusion.   4.  No evidence of fracture or osteonecrosis.  PATIENT SURVEYS:  FOTO  6/26: 67%,  6/3: 58%;   evaluation: 23%  COGNITION: Overall cognitive status: Within functional limits for tasks assessed     SENSATION: Not tested  EDEMA:  Slight swelling on R knee. Bandage present without drainage. Knee brace locked in extension  MUSCLE LENGTH: Moderate restriction on B hamstrings Mild restriction on B hamstrings  POSTURE: rounded shoulders and forward head  PALPATION: Grade 1 tenderness on major bony landmarks on the R knee  LOWER EXTREMITY ROM:  Active ROM Right eval Left eval Right 09/29/22 Right 10/28/22 Right 11/10/22 Right 11/23/22 Right 01/14/23  Hip flexion [facility]       Hip extension [facility]       Hip abduction [facility]       Hip adduction         Hip internal rotation         Hip external rotation         Knee flexion 20 WFL 77 115 120 122 128  Knee extension 0 WFL 0 0 (left knee hyperextends) 0 0 0  Ankle dorsiflexion [facility]       Ankle plantarflexion [facility] WFL       Ankle inversion         Ankle eversion          (Blank rows = not tested)  LOWER EXTREMITY MMT:  MMT Right eval Left eval Right 11/23/22 Right 12/16/22  Hip flexion 2- 5 4+ 5  Hip extension 3+ 5    Hip abduction 3+ 5    Hip adduction      Hip internal rotation      Hip external rotation      Knee flexion  5 4+ 5  Knee extension  5 4 5   Ankle dorsiflexion 4 5 5    Ankle plantarflexion 4 5    Ankle inversion      Ankle eversion       (Blank rows = not tested)    FUNCTIONAL TESTS:  Evaluation:  2 minute walk test: 216 ft done with knee brace and B axillary crutches   11/23/22 FOTO 58 5 times sit to stand 10.82 sec no UE  assist 2 MWT 445 ft no AD Right knee AROM 0-122 MMT's see above  12/16/22 FOTO 67% (was 58% at last PN) 5 times sit to stand  5.31sec no UE (was 10.82 sec no UE assist) 2 MWT (goal met at last visit, NT) MMT's see above 1 rep max: hamstrings Rt: 45#  Lt:  55#  (82%)   Quadriceps Rt: 8# Lt: 20# (40%)  GAIT: Distance walked: 216 ft Assistive device utilized: Crutches Level of assistance: Complete Independence Comments: Modified 3-point pattern   TODAY'S TREATMENT:  DATE:  01/29/23 Nustep seat 7 level 6; 5 min dynamic warm up  Sled push/pull 45# x 20 ft x 5 3 plates right leg press 2 x 10 Bilateral heel raises x 10; right leg only heel raises x 10 Calf stretch on steps 5 x 20" Sit to stand from mat with yellow med ball with left leg advanced 2 x 10 Step downs/heel taps x 5 on 8" step Squats 2 x 10 Hip vectors 2" hold 2 x 10  Sitting: LAQ's 5# 2 x 10  01/21/23 Nustep seat 7 level 6; 5 min dynamic warm up Standing:  squats on aeromat 2X10 Sit to stand with left foot extended 2 x 10 chair no UE assist Forward lunges onto Rt LE no UE 2X10 4" step 2X10 step over and downs  4" step with 1" sliding board forward step downs 10X 6" forward step downs 10X 6" lateral heel taps 10X2 [employer] Leg press bil LE 3pL 10X  Rt only 2Pl 2X10 [employer] quad machine   01/14/23 Nustep seat 7 level 6 x 5' dynamic warm up AROM and MMT's see above SLR x 10 no lag noted 1 rep max: Leg press left leg 7 plates; right 4 plates (Right 11% of left) Right leg press 3 plates 3 x 5 Calf stretch on step 3 x 20" each Sit to stand from mat table no UE assist with left foot on heel x 10 Front squat to mat table 10# x 10  01/08/23 Nustep seat 7 level 6 x 5' dynamic warm up  Calf stretch on step 5 x 20" Calf raises on incline x 20 Leg press 3 plates 3 x 5 Sit to stand with left  foot extended 2 x 10 chair no UE assist 6" box step up and over x 15 reps with 1 UE assist Squats 2 x 10   01/06/23:  Elliptical x 4 min L1 2 up and 1 down heel raise Single leg squat- unable Squat with Lt foot on 6in step- reports of increased Lt calf pain  Homan's test- negative Educated s/s of blood clot.   01/01/23 Nustep seat 7 level 6 x 5' dynamic warm up  Sit to stand with left foot extended 2 x 10 from mat table Heel raises x 20 Slant board 5 x 20" Toe raises on incline x 20 Leg press 3 plates 3 x 5 Lateral Step downs x 15  12/22/22: Standing:  Elliptical x 3' L1 Squat to heel raise with HHA 2x 10x SLS Rt 32" Lt 43" Vector stance 5x 5" Rt LE WB 6in forward step up 15 no HHA 6in lateral step up 15x no HHA 6in step down 15x 1 HHA Forward lunges attempted on floor, decreased pain with 2in step height  Leg press 2Pl Rt LE only 2x 10 reps  12/16/22 Bike seat 8 level 2 5 minutes Progress Note FOTO 67% (was 58% at last PN) 5 times sit to stand  5.31sec no UE (was 10.82 sec no UE assist) 2 MWT (goal met at last visit, NT) MMT's see above 1 rep max: hamstrings Rt: 45#  Lt:  55#  (82%)   Quadriceps Rt: 80# Lt: 20# (40%)  12/03/22 All exercises completed without brace on Compression garment donning/doffing/education of wear and wash Bike seat 8 level 2 5 minutes Standing: Squat to heel raise min HHA 20X  6" Rt forward step up with power up no UE 20X  4" Rt eccentric lowering Lt heel tap lateral step  down 1 HHA 20X  4" Rt eccentric lowering Lt heel tap forward step down 2X10 with 1 HHA  4" Rt lead forward lunges no UE assist 2X10  Rt vector stance with Lt 5" holds, 1 HHA       PATIENT EDUCATION:  Education details: Educated on the goals and course of rehab. Written HEP provided and reviewed Person educated: Patient Education method: Explanation, Demonstration, and Handouts Education comprehension: verbalized understanding and returned demonstration  HOME  EXERCISE PROGRAM: Access Code: [email or web address] URL: [email or web address] Date: 01/01/2023 Prepared by: [facility]  Exercises - Active Straight Leg Raise with Quad Set  - 2 x daily - 7 x weekly - 3 sets - 10 reps - Sidelying Hip Abduction  - 2 x daily - 7 x weekly - 3 sets - 10 reps - Prone Hip Extension  - 3 x daily - 7 x weekly - 3 sets - 10 reps - Heel Raises with Counter Support  - 2 x daily - 7 x weekly - 1 sets - 10 reps - Forward Step Up with Counter Support  - 2 x daily - 7 x weekly - 1 sets - 10 reps - Standing Hamstring Stretch with Step  - 2 x daily - 7 x weekly - 1 sets - 5 reps - 10 sec hold - Standing Knee Flexion Stretch on Step  - 2 x daily - 7 x weekly - 1 sets - 5 reps - 10 sec hold - Mini Squat with Counter Support  - 1 x daily - 7 x weekly - 1 sets - 10 reps - 5" hold - Single Leg Stance  - 1 x daily - 7 x weekly - 1 sets - 5 reps - Backward Walking with Counter Support  - 1 x daily - 7 x weekly - 1 sets - 10 reps - Staggered Sit-to-Stand  - 2 x daily - 7 x weekly - 2 sets - 5 reps - Lateral Step Down  - 2 x daily - 7 x weekly - 1 sets - 10 reps Access Code: [email or web address] URL: https- Mini Squat with Counter Support  - 1 x daily - 7 x weekly - 1 sets - 10 reps - 5" hold - Single Leg Stance  - 1 x daily - 7 x weekly - 1 sets - 5 reps - Backward Walking with Counter Support  - 1 x daily - 7 x weekly - 1 sets - 10 [email or web address] 5/24  Date: 09/29/2022 Prepared by: Emeline Gins Exercises - Active Straight Leg Raise with Quad Set  - 2 x daily - 7 x weekly - 3 sets - 10 reps - Sidelying Hip Abduction  - 2 x daily - 7 x weekly - 3 sets - 10 reps - Sidelying Hip Adduction  - 3 x daily - 7 x weekly - 3 sets - 10 reps - Prone Hip Extension  - 3 x daily - 7 x weekly - 3 sets - 10 reps - Prone Knee Flexion AROM  - 3 x daily - 7 x weekly - 3 sets - 10 reps  Date: 09/18/2022 Prepared by: Krystal Clark Exercises - Supine Heel Slide with Strap  - 2 x  daily - 7 x weekly - 3 sets - 10 reps - Supine Quad Set  - 2 x daily - 7 x weekly - 3 sets - 10 reps - 6 hold  ASSESSMENT:  CLINICAL IMPRESSION:   Continued focus on quad strengthening.  Increased weight with single  leg press with good challenge.  Still has max fatigue with step downs/heel taps from 8" step.  Added LAQ's with 5# weight.patient with appropriate amounts of fatigue at the end of treatment today.    Patient will benefit from continued skilled therapy services  to address deficits and promote return to optimal function and to address remaining unmet therapy goals.      OBJECTIVE IMPAIRMENTS: Abnormal gait, decreased activity tolerance, decreased balance, difficulty walking, decreased ROM, decreased strength, impaired flexibility, and pain.   ACTIVITY LIMITATIONS: carrying, lifting, bending, sitting, squatting, sleeping, stairs, transfers, bed mobility, bathing, toileting, dressing, and hygiene/grooming  PARTICIPATION LIMITATIONS: meal prep, cleaning, laundry, driving, shopping, community activity, and occupation  PERSONAL FACTORS: Time since onset of injury/illness/exacerbation are also affecting patient's functional outcome.   REHAB POTENTIAL: Good  CLINICAL DECISION MAKING: Stable/uncomplicated  EVALUATION COMPLEXITY: Low   GOALS: Goals reviewed with patient? Yes  SHORT TERM GOALS: Target date: 10/09/2022 Pt will demonstrate indep in HEP to facilitate carry-over of skilled services and improve functional outcomes Goal status: MET  2.  Patient will demonstrate increase in knee flex ROM to 120 degrees to facilitate ease in ambulation Baseline: 20 degrees Goal status: MET   LONG TERM GOALS: Target date: 10/30/2022  Pt will increase by at least 40 ft without assistive device in order to demonstrate clinically significant improvement in community ambulation Baseline: 216 ft; 11/23/22 445 ft Goal status: MET  2.  Pt will increase FOTO to the predicted pattern in  order to demonstrate significant improvement in function related to ambulation  Baseline: 23.08; 58 (goal 65) 11/23/22 Goal status: MET  3.  Pt will demonstrate increase in LE strength to 5 to facilitate ease and safety in ambulation  Baseline: 2-/5; see above Goal status: MET  4.  Pt will be able to perform SLS on the R to 15 sec (firm surface, eyes open( to facilitate ease and safety and ambulation Baseline: not performed Goal status: MET  5.  Patient will increase right leg MMTs to 5/5 without pain to promote return to ambulation community distances with minimal deviation.  Baseline: see above  Goal status: set 7/2 6.  Patient will increase right leg press strength to 80% of the left leg to promote return to full activity; working, family activity  Baseline: see above;  6/26/241 rep max: hamstrings Rt: 45#  Lt:  55#  (82%)   Quadriceps left: 80# Rt: 20# (40%)  01/14/23 right leg 4 plates; left leg 7 plates (right at 57%)  Goal status; 40%     PLAN:  PT FREQUENCY: 3x/week  PT DURATION: 6 weeks  PLANNED INTERVENTIONS: Therapeutic exercises, Therapeutic activity, Neuromuscular re-education, Balance training, Gait training, Patient/Family education, Self Care, Stair training, and Manual therapy  PLAN FOR NEXT SESSION: Continue POC with higher level strengthening and stability. Progress eccentric strengthening, single leg strengthening; more high level squats and challenges. Continue therapy to address remaining unmet goals.   2:30 PM, 01/29/23 Phila Shoaf Small Ariane Ditullio MPT [place] [facility] [place] [phone]

## 2023-02-04 ENCOUNTER — Ambulatory Visit (HOSPITAL_COMMUNITY): Payer: Self-pay

## 2023-02-04 DIAGNOSIS — M25561 Pain in right knee: Secondary | ICD-10-CM

## 2023-02-04 DIAGNOSIS — R262 Difficulty in walking, not elsewhere classified: Secondary | ICD-10-CM

## 2023-02-04 DIAGNOSIS — R29898 Other symptoms and signs involving the musculoskeletal system: Secondary | ICD-10-CM

## 2023-02-04 NOTE — Therapy (Signed)
OUTPATIENT PHYSICAL THERAPY TREATMENT/progress note/discharge note Progress Note Reporting Period 01/14/2023 to 02/04/2023  See note below for Objective Data and Assessment of Progress/Goals.   PHYSICAL THERAPY DISCHARGE SUMMARY  Visits from Start of Care: 24  Current functional level related to goals / functional outcomes: See below   Remaining deficits: See below   Education / Equipment: HEP   Patient agrees to discharge. Patient goals were partially met. Patient is being discharged due to being pleased with the current functional level.      Patient Name: Kristine Adams MRN: [id number] DOB:Mar 23, 1986, 37 y.o., female Today's Date: 02/04/2023  END OF SESSION:   PT End of Session - 02/04/23 1350     Visit Number 24    Number of Visits 24    Date for PT Re-Evaluation 02/06/23    Authorization Type 50% [facility] [employer]    Progress Note Due on Visit 24    PT Start Time 1350    PT Stop Time 1430    PT Time Calculation (min) 40 min    Activity Tolerance Patient tolerated treatment well   pt concerned of blood clot, session ended early to calf pain   Behavior During Therapy [facility] for tasks assessed/performed                  Past Medical History:  Diagnosis Date   Anemia    Hyperlipidemia    No pertinent past medical history    Past Surgical History:  Procedure Laterality Date   ANTERIOR CRUCIATE LIGAMENT REPAIR Right 09/08/2022   Procedure: ANTERIOR CRUCIATE LIGAMENT (ACL) REPAIR WITH QUADRICEPS AUTOGRAFT;  Surgeon: Vickki Hearing, MD;  Location: [facility];  Service: Orthopedics;  Laterality: Right;   BREAST LUMPECTOMY WITH RADIOACTIVE SEED LOCALIZATION Right 07/23/2020   Procedure: RIGHT BREAST LUMPECTOMYX 2  WITH RADIOACTIVE SEED LOCALIZATION;  Surgeon: Harriette Bouillon, MD;  Location: [facility];  Service: General;  Laterality: Right;   CESAREAN SECTION     CESAREAN SECTION WITH BILATERAL TUBAL LIGATION  07/11/2012    Procedure: CESAREAN SECTION WITH BILATERAL TUBAL LIGATION;  Surgeon: Tilda Burrow, MD;  Location: [facility];  Service: Obstetrics;  Laterality: Bilateral;  speaks spanish, interpreter needed   KNEE ARTHROSCOPY WITH MEDIAL MENISECTOMY Right 09/08/2022   Procedure: KNEE ARTHROSCOPY EXAM UNDER ANESTHESIA;  Surgeon: Vickki Hearing, MD;  Location: [facility];  Service: Orthopedics;  Laterality: Right;   TUBAL LIGATION     Patient Active Problem List   Diagnosis Date Noted   Rupture of anterior cruciate ligament of right knee 09/08/2022   Right shoulder pain 05/12/2022   History of DVT of lower extremity 05/12/2022   Encounter for gynecological examination with Papanicolaou smear of cervix 05/12/2022   Right knee pain 05/12/2022   Dyspareunia, female 05/12/2022   DVT (deep venous thrombosis) (HCC) 03/16/2022   Itching of vulva 07/28/2021   Screening breast examination 01/24/2019   Left breast mass 01/24/2019   Menorrhagia with regular cycle 09/21/2018    PCP: Reather Converse, PA-C  REFERRING PROVIDER: Vickki Hearing, MD  REFERRING DIAG: [phone] (ICD-10-CM) - S/P ACL reconstruction  THERAPY DIAG:  Difficulty walking  Weakness of right lower extremity  Right knee pain, unspecified chronicity  Rationale for Evaluation and Treatment: Rehabilitation  ONSET DATE: 09/08/2022  SUBJECTIVE:   SUBJECTIVE STATEMENT:  Pt comes today with interpreter and daughter.  Still having some issue with steps although is better  PERTINENT HISTORY: Dizziness, blood clots PAIN:  Are you having pain?  Yes: NPRS scale: 0/10 Pain location: in front of the R knee Pain description: pulsating Aggravating factors: moving the knee, walking Relieving factors: rest  PRECAUTIONS: Other: Can take the brace when laying down. Needs to wear the brace locked in ext for 4 weeks when upright  WEIGHT BEARING RESTRICTIONS: Yes WBAT  FALLS:  Has patient fallen in last 6 months? No  LIVING  ENVIRONMENT: Lives with: lives with their family Lives in: Mobile home Stairs: Yes: External: 7 steps; can reach both Has following equipment at home: Crutches  OCCUPATION: in food preparation  PLOF: Independent  PATIENT GOALS: "to walk perfectly so I can get back to work"  NEXT MD VISIT: 12/31/22  OBJECTIVE:   DIAGNOSTIC FINDINGS:  MRI OF THE RIGHT KNEE WITHOUT CONTRAST 03/04/2022   IMPRESSION: 1.  Radial tear of the posterior horn of the lateral meniscus.   2. Thickening/partial-thickness tear of the medial collateral ligament,, likely a chronic process.   2. Edema and thickening of the anterior cruciate ligament without evidence of discrete tear suggesting chronic ligamentous sprain.   3.  Moderate knee joint effusion.   4.  No evidence of fracture or osteonecrosis.  PATIENT SURVEYS:  FOTO  6/26: 67%,  6/3: 58%;   evaluation: 23%  COGNITION: Overall cognitive status: Within functional limits for tasks assessed     SENSATION: Not tested  EDEMA:  Slight swelling on R knee. Bandage present without drainage. Knee brace locked in extension  MUSCLE LENGTH: Moderate restriction on B hamstrings Mild restriction on B hamstrings  POSTURE: rounded shoulders and forward head  PALPATION: Grade 1 tenderness on major bony landmarks on the R knee  LOWER EXTREMITY ROM:  Active ROM Right eval Left eval Right 09/29/22 Right 10/28/22 Right 11/10/22 Right 11/23/22 Right 01/14/23  Hip flexion [facility]       Hip extension [facility]       Hip abduction [facility]       Hip adduction         Hip internal rotation         Hip external rotation         Knee flexion 20 WFL 77 115 120 122 128  Knee extension 0 WFL 0 0 (left knee hyperextends) 0 0 0  Ankle dorsiflexion [facility]       Ankle plantarflexion [facility]       Ankle inversion         Ankle eversion          (Blank rows = not tested)  LOWER EXTREMITY MMT:  MMT Right eval Left eval Right 11/23/22 Right 12/16/22  Hip flexion  2- 5 4+ 5  Hip extension 3+ 5    Hip abduction 3+ 5    Hip adduction      Hip internal rotation      Hip external rotation      Knee flexion  5 4+ 5  Knee extension  5 4 5   Ankle dorsiflexion 4 5 5    Ankle plantarflexion 4 5    Ankle inversion      Ankle eversion       (Blank rows = not tested)    FUNCTIONAL TESTS:  Evaluation:  2 minute walk test: 216 ft done with knee brace and B axillary crutches   11/23/22 FOTO 58 5 times sit to stand 10.82 sec no UE assist 2 MWT 445 ft no AD Right knee AROM 0-122 MMT's see above  12/16/22 FOTO 67% (was 58%  at last PN) 5 times sit to stand  5.31sec no UE (was 10.82 sec no UE assist) 2 MWT (goal met at last visit, NT) MMT's see above 1 rep max: hamstrings Rt: 45#  Lt:  55#  (82%)   Quadriceps Rt: 8# Lt: 20# (40%)  GAIT: Distance walked: 216 ft Assistive device utilized: Crutches Level of assistance: Complete Independence Comments: Modified 3-point pattern   TODAY'S TREATMENT:                                                                                                                              DATE:  02/04/23 Nustep seat 7 level 6; 5 min dynamic warm up Progress note FOTO 5 times sit to stand 2 MWT Leg press one rep max  left 7; right 9 plates  10/27/82 Nustep seat 7 level 6; 5 min dynamic warm up  Sled push/pull 45# x 20 ft x 5 3 plates right leg press 2 x 10 Bilateral heel raises x 10; right leg only heel raises x 10 Calf stretch on steps 5 x 20" Sit to stand from mat with yellow med ball with left leg advanced 2 x 10 Step downs/heel taps x 5 on 8" step Squats 2 x 10 Hip vectors 2" hold 2 x 10  Sitting: LAQ's 5# 2 x 10  01/21/23 Nustep seat 7 level 6; 5 min dynamic warm up Standing:  squats on aeromat 2X10 Sit to stand with left foot extended 2 x 10 chair no UE assist Forward lunges onto Rt LE no UE 2X10 4" step 2X10 step over and downs  4" step with 1" sliding board forward step downs 10X 6" forward step  downs 10X 6" lateral heel taps 10X2 [employer] Leg press bil LE 3pL 10X  Rt only 2Pl 2X10 [employer] quad machine   01/14/23 Nustep seat 7 level 6 x 5' dynamic warm up AROM and MMT's see above SLR x 10 no lag noted 1 rep max: Leg press left leg 7 plates; right 4 plates (Right 69% of left) Right leg press 3 plates 3 x 5 Calf stretch on step 3 x 20" each Sit to stand from mat table no UE assist with left foot on heel x 10 Front squat to mat table 10# x 10  01/08/23 Nustep seat 7 level 6 x 5' dynamic warm up  Calf stretch on step 5 x 20" Calf raises on incline x 20 Leg press 3 plates 3 x 5 Sit to stand with left foot extended 2 x 10 chair no UE assist 6" box step up and over x 15 reps with 1 UE assist Squats 2 x 10   01/06/23:  Elliptical x 4 min L1 2 up and 1 down heel raise Single leg squat- unable Squat with Lt foot on 6in step- reports of increased Lt calf pain  Homan's test- negative Educated s/s of blood clot.   01/01/23 Nustep seat 7 level 6 x  5' dynamic warm up  Sit to stand with left foot extended 2 x 10 from mat table Heel raises x 20 Slant board 5 x 20" Toe raises on incline x 20 Leg press 3 plates 3 x 5 Lateral Step downs x 15  12/22/22: Standing:  Elliptical x 3' L1 Squat to heel raise with HHA 2x 10x SLS Rt 32" Lt 43" Vector stance 5x 5" Rt LE WB 6in forward step up 15 no HHA 6in lateral step up 15x no HHA 6in step down 15x 1 HHA Forward lunges attempted on floor, decreased pain with 2in step height  Leg press 2Pl Rt LE only 2x 10 reps  12/16/22 Bike seat 8 level 2 5 minutes Progress Note FOTO 67% (was 58% at last PN) 5 times sit to stand  5.31sec no UE (was 10.82 sec no UE assist) 2 MWT (goal met at last visit, NT) MMT's see above 1 rep max: hamstrings Rt: 45#  Lt:  55#  (82%)   Quadriceps Rt: 80# Lt: 20# (40%)  12/03/22 All exercises completed without brace on Compression garment donning/doffing/education of wear and wash Bike seat 8 level 2  5 minutes Standing: Squat to heel raise min HHA 20X  6" Rt forward step up with power up no UE 20X  4" Rt eccentric lowering Lt heel tap lateral step down 1 HHA 20X  4" Rt eccentric lowering Lt heel tap forward step down 2X10 with 1 HHA  4" Rt lead forward lunges no UE assist 2X10  Rt vector stance with Lt 5" holds, 1 HHA       PATIENT EDUCATION:  Education details: Educated on the goals and course of rehab. Written HEP provided and reviewed Person educated: Patient Education method: Explanation, Demonstration, and Handouts Education comprehension: verbalized understanding and returned demonstration  HOME EXERCISE PROGRAM: Access Code: [email or web address] URL: [email or web address] Date: 01/01/2023 Prepared by: [facility]  Exercises - Active Straight Leg Raise with Quad Set  - 2 x daily - 7 x weekly - 3 sets - 10 reps - Sidelying Hip Abduction  - 2 x daily - 7 x weekly - 3 sets - 10 reps - Prone Hip Extension  - 3 x daily - 7 x weekly - 3 sets - 10 reps - Heel Raises with Counter Support  - 2 x daily - 7 x weekly - 1 sets - 10 reps - Forward Step Up with Counter Support  - 2 x daily - 7 x weekly - 1 sets - 10 reps - Standing Hamstring Stretch with Step  - 2 x daily - 7 x weekly - 1 sets - 5 reps - 10 sec hold - Standing Knee Flexion Stretch on Step  - 2 x daily - 7 x weekly - 1 sets - 5 reps - 10 sec hold - Mini Squat with Counter Support  - 1 x daily - 7 x weekly - 1 sets - 10 reps - 5" hold - Single Leg Stance  - 1 x daily - 7 x weekly - 1 sets - 5 reps - Backward Walking with Counter Support  - 1 x daily - 7 x weekly - 1 sets - 10 reps - Staggered Sit-to-Stand  - 2 x daily - 7 x weekly - 2 sets - 5 reps - Lateral Step Down  - 2 x daily - 7 x weekly - 1 sets - 10 reps Access Code: [email or web address] URL: https- Mini Squat with Counter Support  - 1 x daily -  7 x weekly - 1 sets - 10 reps - 5" hold - Single Leg Stance  - 1 x daily - 7 x weekly - 1 sets - 5 reps - Backward Walking  with Counter Support  - 1 x daily - 7 x weekly - 1 sets - 10 [email or web address] 5/24  Date: 09/29/2022 Prepared by: Emeline Gins Exercises - Active Straight Leg Raise with Quad Set  - 2 x daily - 7 x weekly - 3 sets - 10 reps - Sidelying Hip Abduction  - 2 x daily - 7 x weekly - 3 sets - 10 reps - Sidelying Hip Adduction  - 3 x daily - 7 x weekly - 3 sets - 10 reps - Prone Hip Extension  - 3 x daily - 7 x weekly - 3 sets - 10 reps - Prone Knee Flexion AROM  - 3 x daily - 7 x weekly - 3 sets - 10 reps  Date: 09/18/2022 Prepared by: Krystal Clark Exercises - Supine Heel Slide with Strap  - 2 x daily - 7 x weekly - 3 sets - 10 reps - Supine Quad Set  - 2 x daily - 7 x weekly - 3 sets - 10 reps - 6 hold  ASSESSMENT:  CLINICAL IMPRESSION:   Progress note; Patient only unachieved goal was leg press on right 80% of left and she is at 78%; patient will achieve with continued HEP and return to the gym.  She is agreeable to discharge at this time.   OBJECTIVE IMPAIRMENTS: Abnormal gait, decreased activity tolerance, decreased balance, difficulty walking, decreased ROM, decreased strength, impaired flexibility, and pain.   ACTIVITY LIMITATIONS: carrying, lifting, bending, sitting, squatting, sleeping, stairs, transfers, bed mobility, bathing, toileting, dressing, and hygiene/grooming  PARTICIPATION LIMITATIONS: meal prep, cleaning, laundry, driving, shopping, community activity, and occupation  PERSONAL FACTORS: Time since onset of injury/illness/exacerbation are also affecting patient's functional outcome.   REHAB POTENTIAL: Good  CLINICAL DECISION MAKING: Stable/uncomplicated  EVALUATION COMPLEXITY: Low   GOALS: Goals reviewed with patient? Yes  SHORT TERM GOALS: Target date: 10/09/2022 Pt will demonstrate indep in HEP to facilitate carry-over of skilled services and improve functional outcomes Goal status: MET  2.  Patient will demonstrate increase in knee flex  ROM to 120 degrees to facilitate ease in ambulation Baseline: 20 degrees Goal status: MET   LONG TERM GOALS: Target date: 10/30/2022  Pt will increase by at least 40 ft without assistive device in order to demonstrate clinically significant improvement in community ambulation Baseline: 216 ft; 11/23/22 445 ft Goal status: MET  2.  Pt will increase FOTO to the predicted pattern in order to demonstrate significant improvement in function related to ambulation  Baseline: 23.08; 58 (goal 65) 11/23/22 Goal status: MET  3.  Pt will demonstrate increase in LE strength to 5 to facilitate ease and safety in ambulation  Baseline: 2-/5; see above Goal status: MET  4.  Pt will be able to perform SLS on the R to 15 sec (firm surface, eyes open( to facilitate ease and safety and ambulation Baseline: not performed Goal status: MET  5.  Patient will increase right leg MMTs to 5/5 without pain to promote return to ambulation community distances with minimal deviation.  Baseline: see above  Goal status: set 7/2 6.  Patient will increase right leg press strength to 80% of the left leg to promote return to full activity; working, family activity  Baseline: see above;  6/26/241 rep max: hamstrings Rt: 45#  Lt:  55#  (82%)   Quadriceps left: 80# Rt: 20# (40%)  01/14/23 right leg 4 plates; left leg 7 plates (right at 57%); left leg 7 plates; right leg 9 plates 29%  Goal status; 40%     PLAN:  PT FREQUENCY: 3x/week  PT DURATION: 6 weeks  PLANNED INTERVENTIONS: Therapeutic exercises, Therapeutic activity, Neuromuscular re-education, Balance training, Gait training, Patient/Family education, Self Care, Stair training, and Manual therapy  PLAN FOR NEXT SESSION: discharge 2:38 PM, 02/04/23 Isola Mehlman Small Leith Szafranski MPT [facility] [place] [phone] [phone]

## 2023-03-18 ENCOUNTER — Encounter: Payer: Self-pay | Admitting: Orthopedic Surgery

## 2023-03-18 ENCOUNTER — Ambulatory Visit (INDEPENDENT_AMBULATORY_CARE_PROVIDER_SITE_OTHER): Payer: Self-pay | Admitting: Orthopedic Surgery

## 2023-03-18 VITALS — BP 130/90 | HR 84 | Ht 61.0 in | Wt 175.0 lb

## 2023-03-18 DIAGNOSIS — S83511D Sprain of anterior cruciate ligament of right knee, subsequent encounter: Secondary | ICD-10-CM

## 2023-03-18 DIAGNOSIS — Z9889 Other specified postprocedural states: Secondary | ICD-10-CM

## 2023-03-18 NOTE — Progress Notes (Signed)
Follow-up visit  37 year old female status post quadriceps ACL reconstruction right knee in March 2024 complicated by preop DVT.  So surgery was delayed.  She is doing well her only issue is with climbing the steps and descending the steps  She is otherwise having no pain in her knee feels very stable  Examination shows a normal gait well-healed incision over the quadriceps tendon with slight hypertrophy of the incision.  No effusion full range of motion when compared to the other side.  We did lose 2 to 3 degrees of hyperextension on the right knee compared to the left but the Lachman test is very solid and stable  Recommend continue quadricep strengthening and follow-up in 6 months

## 2023-09-09 ENCOUNTER — Ambulatory Visit: Payer: Self-pay | Admitting: Orthopedic Surgery

## 2023-09-30 ENCOUNTER — Ambulatory Visit (INDEPENDENT_AMBULATORY_CARE_PROVIDER_SITE_OTHER): Payer: Self-pay | Admitting: Orthopedic Surgery

## 2023-09-30 ENCOUNTER — Encounter: Payer: Self-pay | Admitting: Orthopedic Surgery

## 2023-09-30 ENCOUNTER — Other Ambulatory Visit (INDEPENDENT_AMBULATORY_CARE_PROVIDER_SITE_OTHER): Payer: Self-pay

## 2023-09-30 DIAGNOSIS — Z9889 Other specified postprocedural states: Secondary | ICD-10-CM | POA: Diagnosis not present

## 2023-09-30 DIAGNOSIS — M2241 Chondromalacia patellae, right knee: Secondary | ICD-10-CM | POA: Diagnosis not present

## 2023-09-30 DIAGNOSIS — S83511D Sprain of anterior cruciate ligament of right knee, subsequent encounter: Secondary | ICD-10-CM

## 2023-09-30 NOTE — Progress Notes (Signed)
   There were no vitals taken for this visit.  There is no height or weight on file to calculate BMI.  Chief Complaint  Patient presents with   Post-op Follow-up    One year s/p ACL surgery right knee 09/08/22    Encounter Diagnosis  Name Primary?   S/P ACL reconstruction Right 09/08/22 Yes    DOI/DOS/ Date: 09/08/22  Improved

## 2023-09-30 NOTE — Progress Notes (Signed)
 Chief Complaint  Patient presents with   Post-op Follow-up      One year s/p ACL surgery right knee 09/08/22          Encounter Diagnosis  Name Primary?   S/P ACL reconstruction Right 09/08/22 Yes      DOI/DOS/ Date: 09/08/22   Improved   1 year follow-up ACL reconstruction right knee quadriceps tendon graft  Patient overall doing well has some trouble going down the steps.  She also has a twitching sensation in the knee which is intermittent short-lived sometimes painful  Exam shows normal Lachman test has been restored no pivoting she has some patellofemoral crepitance  She has some pain over the lateral incision of the graft button  Imaging obtained  Assessment and plan  Imaging does not show anything the button looks like it is in pretty good position there could be some irritation of the ITB but the patient's pain is all in the front of the knee and worse with stair climbing or long days at work  So I gave her some quad exercises for 1 year follow-up follow-up  Knee is stable no pain no catching locking or giving way  Return as needed

## 2024-02-14 ENCOUNTER — Ambulatory Visit: Admitting: Adult Health

## 2024-02-22 ENCOUNTER — Ambulatory Visit: Admitting: Adult Health

## 2024-04-12 ENCOUNTER — Ambulatory Visit: Admitting: Adult Health

## 2024-07-06 ENCOUNTER — Encounter: Payer: Self-pay | Admitting: Adult Health

## 2024-07-06 ENCOUNTER — Other Ambulatory Visit (HOSPITAL_COMMUNITY)
Admission: RE | Admit: 2024-07-06 | Discharge: 2024-07-06 | Disposition: A | Payer: Self-pay | Source: Ambulatory Visit | Attending: Adult Health | Admitting: Adult Health

## 2024-07-06 ENCOUNTER — Ambulatory Visit (INDEPENDENT_AMBULATORY_CARE_PROVIDER_SITE_OTHER): Payer: Self-pay | Admitting: Adult Health

## 2024-07-06 VITALS — BP 114/73 | HR 69 | Ht 62.25 in | Wt 183.5 lb

## 2024-07-06 DIAGNOSIS — Z13220 Encounter for screening for lipoid disorders: Secondary | ICD-10-CM

## 2024-07-06 DIAGNOSIS — F418 Other specified anxiety disorders: Secondary | ICD-10-CM

## 2024-07-06 DIAGNOSIS — Z1231 Encounter for screening mammogram for malignant neoplasm of breast: Secondary | ICD-10-CM

## 2024-07-06 DIAGNOSIS — F32A Depression, unspecified: Secondary | ICD-10-CM

## 2024-07-06 DIAGNOSIS — Z01419 Encounter for gynecological examination (general) (routine) without abnormal findings: Secondary | ICD-10-CM | POA: Insufficient documentation

## 2024-07-06 DIAGNOSIS — R5383 Other fatigue: Secondary | ICD-10-CM

## 2024-07-06 DIAGNOSIS — N920 Excessive and frequent menstruation with regular cycle: Secondary | ICD-10-CM

## 2024-07-06 MED ORDER — FLUCONAZOLE 150 MG PO TABS: ORAL_TABLET | ORAL | 1 refills | Status: DC

## 2024-07-06 NOTE — Progress Notes (Signed)
 Patient ID: Kristine Adams, female   DOB: [birth year], 39 y.o.   MRN: [id number] History of Present Illness: Kristine Adams is a 39 year old Hispanic female, married, [id number] in for a well woman gyn exam and pap. She requests labs. She has an interpreter with her.   Current Medications, Allergies, Past Medical History, Past Surgical History, Family History and Social History were reviewed in [name].     Review of Systems: Patient denies any headaches, hearing loss, blurred vision, shortness of breath, chest pain, abdominal pain, problems with bowel movements, urination, or intercourse. No joint pain or mood swings.  Feels tired, not her self Periods last about 10 days and may be heavy for 3 days, and feels like having chills and nausea with period  Has noticed odor,?yeast Had bump on buttock but it resolved  Breasts hurt at times She had tubal ligation in 2014, and wonders if they could be reversed   Physical Exam:BP 114/73 (BP Location: Right Arm, Patient Position: Sitting, Cuff Size: Normal)   Pulse 69   Ht 5' 2.25 (1.581 m)   Wt 183 lb 8 oz (83.2 kg)   LMP 06/16/2024 (Exact Date)   BMI 33.29 kg/m   General:  Well developed, well nourished, no acute distress Skin:  Warm and dry Neck:  Midline trachea, normal thyroid, good ROM, no lymphadenopathy Lungs; Clear to auscultation bilaterally Breast:  No dominant palpable mass, retraction, or nipple discharge Cardiovascular: Regular rate and rhythm Abdomen:  Soft, non tender, no hepatosplenomegaly Pelvic:  External genitalia is normal in appearance, no lesions.  The vagina is normal in appearance. Urethra has no lesions or masses. The cervix is smooth,pap with GC/CHL and HR HPV genotyping performed.  Uterus is felt to be normal size, shape, and contour.  No adnexal masses or tenderness noted.Bladder is non tender, no masses felt. Rectal: deferred  Extremities/musculoskeletal:  No swelling or  varicosities noted, no clubbing or cyanosis Psych:  No mood changes, alert and cooperative,seems happy AA is 0 Fall risk is low    07/06/2024    4:00 PM 05/12/2022    3:46 PM  Depression screen PHQ 2/9  Decreased Interest 0 0  Down, Depressed, Hopeless 1 0  PHQ - 2 Score 1 0  Altered sleeping 1 0  Tired, decreased energy 2 0  Change in appetite 1 0  Feeling bad or failure about yourself  1 0  Trouble concentrating 0 0  Moving slowly or fidgety/restless 0 0  Suicidal thoughts 0 0  PHQ-9 Score 6 0      Data saved with a previous flowsheet row definition   She declines meds     07/06/2024    4:00 PM 05/12/2022    3:46 PM  GAD 7 : Generalized Anxiety Score  Nervous, Anxious, on Edge 0 0  Control/stop worrying 1 0  Worry too much - different things 1 0  Trouble relaxing 2 0  Restless 2 0  Easily annoyed or irritable 3 0  Afraid - awful might happen 1 0  Total GAD 7 Score 10 0      Upstream - 07/06/24 1557       Pregnancy Intention Screening   Does the patient want to become pregnant in the next year? No    Does the patient's partner want to become pregnant in the next year? No    Would the patient like to discuss contraceptive options today? No      Contraception Wrap Up  Current Method Female Sterilization    End Method Female Sterilization    Contraception Counseling Provided No          Examination chaperoned by Kristine Salt LPN  Impression and plan: 1. Encounter for gynecological examination with Papanicolaou smear of cervix (Primary) Pap sent  Pap in 3 years if negative  Physical in 1 year Request records from when had tubal in 2014  - Cytology - PAP( [name])  2. Screening mammogram for breast cancer Mammogram order placed for breast center in [place], she will call them - MM 3D SCREENING MAMMOGRAM BILATERAL BREAST; Future  3. Menorrhagia with regular cycle Periods heavy about 3 days and last about 10 days Will get pelvic US  in office 07/26/24  at 2:15 pm and see me after - US  PELVIC COMPLETE WITH TRANSVAGINAL; Future  4. Tired Will check labs  - CBC - Comprehensive metabolic panel with GFR - TSH  5. Screening cholesterol level - Lipid panel   6. Anxiety and depression Declines meds

## 2024-07-08 LAB — CBC
Hematocrit: 39.7 % (ref 34.0–46.6)
Hemoglobin: 11.8 g/dL (ref 11.1–15.9)
MCH: 21.9 pg — ABNORMAL LOW (ref 26.6–33.0)
MCHC: 29.7 g/dL — ABNORMAL LOW (ref 31.5–35.7)
MCV: 74 fL — ABNORMAL LOW (ref 79–97)
Platelets: 327 x10E3/uL (ref 150–450)
RBC: 5.39 x10E6/uL — ABNORMAL HIGH (ref 3.77–5.28)
RDW: 15.9 % — ABNORMAL HIGH (ref 11.7–15.4)
WBC: 5.4 x10E3/uL (ref 3.4–10.8)

## 2024-07-08 LAB — LIPID PANEL
Chol/HDL Ratio: 6 ratio — ABNORMAL HIGH (ref 0.0–4.4)
Cholesterol, Total: 247 mg/dL — ABNORMAL HIGH (ref 100–199)
HDL: 41 mg/dL
LDL Chol Calc (NIH): 181 mg/dL — ABNORMAL HIGH (ref 0–99)
Triglycerides: 135 mg/dL (ref 0–149)
VLDL Cholesterol Cal: 25 mg/dL (ref 5–40)

## 2024-07-08 LAB — COMPREHENSIVE METABOLIC PANEL WITH GFR
ALT: 46 IU/L — ABNORMAL HIGH (ref 0–32)
AST: 29 IU/L (ref 0–40)
Albumin: 4.2 g/dL (ref 3.9–4.9)
Alkaline Phosphatase: 101 IU/L (ref 41–116)
BUN/Creatinine Ratio: 20 (ref 9–23)
BUN: 18 mg/dL (ref 6–24)
Bilirubin Total: 0.4 mg/dL (ref 0.0–1.2)
CO2: 20 mmol/L (ref 20–29)
Calcium: 9.5 mg/dL (ref 8.7–10.2)
Chloride: 103 mmol/L (ref 96–106)
Creatinine, Ser: 0.92 mg/dL (ref 0.57–1.00)
Globulin, Total: 2.9 g/dL (ref 1.5–4.5)
Glucose: 106 mg/dL — ABNORMAL HIGH (ref 70–99)
Potassium: 4.6 mmol/L (ref 3.5–5.2)
Sodium: 139 mmol/L (ref 134–144)
Total Protein: 7.1 g/dL (ref 6.0–8.5)
eGFR: 81 mL/min/1.73

## 2024-07-08 LAB — TSH: TSH: 0.646 u[IU]/mL (ref 0.450–4.500)

## 2024-07-11 ENCOUNTER — Ambulatory Visit: Payer: Self-pay | Admitting: Adult Health

## 2024-07-11 LAB — CYTOLOGY - PAP
Chlamydia: NEGATIVE
Comment: NEGATIVE
Comment: NEGATIVE
Comment: NORMAL
Diagnosis: NEGATIVE
High risk HPV: NEGATIVE
Neisseria Gonorrhea: NEGATIVE

## 2024-07-20 ENCOUNTER — Telehealth: Payer: Self-pay

## 2024-07-20 NOTE — Telephone Encounter (Signed)
 Telephoned patient at mobile number using interpreter, Mliss Rhein. Patient requested mammogram scholarship application. Confirmed patient's address.

## 2024-07-26 ENCOUNTER — Ambulatory Visit: Payer: Self-pay | Admitting: Adult Health

## 2024-07-26 ENCOUNTER — Ambulatory Visit: Payer: Self-pay

## 2024-07-26 ENCOUNTER — Encounter: Payer: Self-pay | Admitting: Adult Health

## 2024-07-26 VITALS — BP 103/69 | HR 67 | Ht 62.25 in | Wt 183.5 lb

## 2024-07-26 DIAGNOSIS — E7800 Pure hypercholesterolemia, unspecified: Secondary | ICD-10-CM | POA: Insufficient documentation

## 2024-07-26 DIAGNOSIS — N920 Excessive and frequent menstruation with regular cycle: Secondary | ICD-10-CM

## 2024-07-26 DIAGNOSIS — N898 Other specified noninflammatory disorders of vagina: Secondary | ICD-10-CM | POA: Insufficient documentation

## 2024-07-26 DIAGNOSIS — Z86718 Personal history of other venous thrombosis and embolism: Secondary | ICD-10-CM

## 2024-07-26 DIAGNOSIS — Z8759 Personal history of other complications of pregnancy, childbirth and the puerperium: Secondary | ICD-10-CM

## 2024-07-26 DIAGNOSIS — L292 Pruritus vulvae: Secondary | ICD-10-CM

## 2024-07-26 DIAGNOSIS — Z319 Encounter for procreative management, unspecified: Secondary | ICD-10-CM | POA: Insufficient documentation

## 2024-07-26 LAB — POCT URINALYSIS DIPSTICK
Glucose, UA: NEGATIVE
Ketones, UA: NEGATIVE
Leukocytes, UA: NEGATIVE
Nitrite, UA: NEGATIVE
Protein, UA: POSITIVE — AB

## 2024-07-26 MED ORDER — NORETHINDRONE 0.35 MG PO TABS: 1.0000 | ORAL_TABLET | Freq: Every day | ORAL | 3 refills | Status: AC

## 2024-07-26 MED ORDER — TRIAMCINOLONE ACETONIDE 0.5 % EX OINT: 1.0000 | TOPICAL_OINTMENT | Freq: Two times a day (BID) | CUTANEOUS | 2 refills | Status: AC

## 2024-07-26 NOTE — Progress Notes (Addendum)
 PELVIC US  TA/TV: heterogeneous anteverted uterus,thickened anterior myometrium,multiple simple nabothian cyst,ill defined endometrium,EEC 4.7 mm,normal ovaries,unable to slide ovaries,no free fluid,left adnexal pain during ultrasound

## 2024-07-26 NOTE — Progress Notes (Signed)
 " Subjective:     Patient ID: Kristine Adams, female   DOB: Feb 14, 1986, 39 y.o.   MRN: [id number]  HPI Rex is a 39 year old Hispanic female, married, [id number] in for pelvic US , and to go over labs. She is complaining of vaginal odor, and itching in vagina and vulva area(weird sensation).  We have requested records for when hse had tubal, she wants another baby. She has interpreter with her and her husband.      Component Value Date/Time   DIAGPAP  07/06/2024 1605    - Negative for intraepithelial lesion or malignancy (NILM)   DIAGPAP  05/12/2022 1507    - Negative for intraepithelial lesion or malignancy (NILM)   HPVHIGH Negative 07/06/2024 1605   HPVHIGH Negative 05/12/2022 1507   ADEQPAP  07/06/2024 1605    Satisfactory for evaluation; transformation zone component PRESENT.   ADEQPAP  05/12/2022 1507    Satisfactory for evaluation; transformation zone component PRESENT.   Review of Systems +heavy periods +vaginal odor +vaginal itching and vulva itching Has pain with sex at times too Reviewed past medical,surgical, social and family history. Reviewed medications and allergies.     Objective:   Physical Exam BP 103/69 (BP Location: Left Arm, Patient Position: Sitting, Cuff Size: Normal)   Pulse 67   Ht 5' 2.25 (1.581 m)   Wt 183 lb 8 oz (83.2 kg)   LMP 06/16/2024 (Exact Date)   BMI 33.29 kg/m  urine dipstick +blood and protein Skin warm and dry.Pelvic: external genitalia is normal in appearance no lesions, vagina: pink, no odor or discharge,urethra has no lesions or masses noted, cervix:smooth, uterus: normal size, shape and contour, mildly tender, no masses felt, adnexa: no masses or tenderness noted. Bladder is non tender and no masses felt.  Examination chaperoned by Clarita Salt LPN   Upstream - 07/26/24 1526       Pregnancy Intention Screening   Does the patient want to become pregnant in the next year? No    Does the patient's partner want to become  pregnant in the next year? No    Would the patient like to discuss contraceptive options today? No      Contraception Wrap Up   Current Method Female Sterilization    End Method Female Sterilization    Contraception Counseling Provided No           US  showed heterogeneous uterus, with anterior thickened myometrium and ill defined endometrium EEC is 4.7 mm, could be adenomyosis, ovaries are normal but  unable to slide     Upstream - 07/26/24 1526       Pregnancy Intention Screening   Does the patient want to become pregnant in the next year? No    Does the patient's partner want to become pregnant in the next year? No    Would the patient like to discuss contraceptive options today? No      Contraception Wrap Up   Current Method Female Sterilization    End Method Female Sterilization    Contraception Counseling Provided No          Assessment:     1. Vaginal odor None today  - POCT Urinalysis Dipstick  2. Vaginal itching - POCT Urinalysis Dipstick  3. Menorrhagia with regular cycle Will try micronor , since US  looked like adenomyosis is possible  Meds ordered this encounter  Medications   norethindrone  (MICRONOR ) 0.35 MG tablet    Sig: Take 1 tablet (0.35 mg total) by mouth daily.  Dispense:  28 tablet    Refill:  3    Supervising Provider:   JAYNE MINDER H [[id number]]   triamcinolone  ointment (KENALOG ) 0.5 %    Sig: Apply 1 Application topically 2 (two) times daily.    Dispense:  30 g    Refill:  2    Supervising Provider:   JAYNE, LUTHER H [[id number]]     4. History of DVT of lower extremity  5. Itching of vulva (Primary) Will rx triamcinolone  ointment   6. Patient desires pregnancy, she has had a tubal Will get appt in about 8 weeks to talk with Dr Ozan, about tubal reversal options, hopefully records will be here by then   7. Elevated cholesterol Eat lean meats and fresh fruit and veggies, eat salmon and take OTC omega 3s     Plan:     Return in 8 weeks to  talk with Dr Ozan     "

## 2024-09-20 ENCOUNTER — Ambulatory Visit: Payer: Self-pay | Admitting: Obstetrics & Gynecology
